# Patient Record
Sex: Female | Born: 1955 | Race: White | Hispanic: No | Marital: Married
Health system: Southern US, Community
[De-identification: ages and names within clinical notes are randomized; demographics above are authoritative.]

## PROBLEM LIST (undated history)

## (undated) DIAGNOSIS — E119 Type 2 diabetes mellitus without complications: Secondary | ICD-10-CM

## (undated) DIAGNOSIS — I890 Lymphedema, not elsewhere classified: Secondary | ICD-10-CM

## (undated) DIAGNOSIS — N183 Chronic kidney disease, stage 3 unspecified: Secondary | ICD-10-CM

## (undated) DIAGNOSIS — I10 Essential (primary) hypertension: Secondary | ICD-10-CM

## (undated) DIAGNOSIS — I509 Heart failure, unspecified: Secondary | ICD-10-CM

---

## 2012-04-07 DIAGNOSIS — E1139 Type 2 diabetes mellitus with other diabetic ophthalmic complication: Secondary | ICD-10-CM | POA: Insufficient documentation

## 2012-07-28 DIAGNOSIS — E11311 Type 2 diabetes mellitus with unspecified diabetic retinopathy with macular edema: Secondary | ICD-10-CM | POA: Insufficient documentation

## 2013-03-27 DIAGNOSIS — I10 Essential (primary) hypertension: Secondary | ICD-10-CM | POA: Insufficient documentation

## 2013-03-27 DIAGNOSIS — N63 Unspecified lump in unspecified breast: Secondary | ICD-10-CM | POA: Insufficient documentation

## 2013-03-27 DIAGNOSIS — C4490 Unspecified malignant neoplasm of skin, unspecified: Secondary | ICD-10-CM | POA: Insufficient documentation

## 2013-03-27 DIAGNOSIS — E785 Hyperlipidemia, unspecified: Secondary | ICD-10-CM | POA: Insufficient documentation

## 2013-03-28 DIAGNOSIS — I110 Hypertensive heart disease with heart failure: Secondary | ICD-10-CM | POA: Insufficient documentation

## 2013-04-15 DIAGNOSIS — I2510 Atherosclerotic heart disease of native coronary artery without angina pectoris: Secondary | ICD-10-CM | POA: Insufficient documentation

## 2013-04-16 DIAGNOSIS — M1990 Unspecified osteoarthritis, unspecified site: Secondary | ICD-10-CM | POA: Insufficient documentation

## 2014-01-27 DIAGNOSIS — D649 Anemia, unspecified: Secondary | ICD-10-CM | POA: Insufficient documentation

## 2014-07-13 DIAGNOSIS — R600 Localized edema: Secondary | ICD-10-CM | POA: Insufficient documentation

## 2016-12-13 DIAGNOSIS — L03031 Cellulitis of right toe: Secondary | ICD-10-CM | POA: Insufficient documentation

## 2016-12-13 DIAGNOSIS — B351 Tinea unguium: Secondary | ICD-10-CM | POA: Insufficient documentation

## 2017-09-17 DIAGNOSIS — L601 Onycholysis: Secondary | ICD-10-CM | POA: Insufficient documentation

## 2017-09-17 DIAGNOSIS — E1140 Type 2 diabetes mellitus with diabetic neuropathy, unspecified: Secondary | ICD-10-CM | POA: Insufficient documentation

## 2017-09-17 DIAGNOSIS — S91209A Unspecified open wound of unspecified toe(s) with damage to nail, initial encounter: Secondary | ICD-10-CM | POA: Insufficient documentation

## 2017-10-10 DIAGNOSIS — R809 Proteinuria, unspecified: Secondary | ICD-10-CM | POA: Insufficient documentation

## 2017-10-23 DIAGNOSIS — I288 Other diseases of pulmonary vessels: Secondary | ICD-10-CM | POA: Insufficient documentation

## 2017-10-29 DIAGNOSIS — R079 Chest pain, unspecified: Secondary | ICD-10-CM | POA: Insufficient documentation

## 2017-10-29 DIAGNOSIS — R9431 Abnormal electrocardiogram [ECG] [EKG]: Secondary | ICD-10-CM | POA: Insufficient documentation

## 2017-10-29 DIAGNOSIS — R0609 Other forms of dyspnea: Secondary | ICD-10-CM | POA: Insufficient documentation

## 2017-12-05 DIAGNOSIS — S90829A Blister (nonthermal), unspecified foot, initial encounter: Secondary | ICD-10-CM | POA: Insufficient documentation

## 2019-01-21 DIAGNOSIS — E875 Hyperkalemia: Secondary | ICD-10-CM | POA: Insufficient documentation

## 2019-01-21 DIAGNOSIS — N179 Acute kidney failure, unspecified: Secondary | ICD-10-CM | POA: Insufficient documentation

## 2019-01-21 DIAGNOSIS — L97922 Non-pressure chronic ulcer of unspecified part of left lower leg with fat layer exposed: Secondary | ICD-10-CM | POA: Insufficient documentation

## 2019-09-08 DIAGNOSIS — L03032 Cellulitis of left toe: Secondary | ICD-10-CM | POA: Insufficient documentation

## 2020-10-22 ENCOUNTER — Emergency Department
Admission: EM | Admit: 2020-10-22 | Discharge: 2020-10-23 | Disposition: A | Discharge status: Home / Self Care | Payer: Medicare Other | Attending: Emergency Medicine | Admitting: Emergency Medicine

## 2020-10-22 ENCOUNTER — Other Ambulatory Visit: Payer: Self-pay

## 2020-10-22 DIAGNOSIS — E119 Type 2 diabetes mellitus without complications: Secondary | ICD-10-CM | POA: Insufficient documentation

## 2020-10-22 DIAGNOSIS — L03317 Cellulitis of buttock: Secondary | ICD-10-CM | POA: Diagnosis not present

## 2020-10-22 DIAGNOSIS — L89311 Pressure ulcer of right buttock, stage 1: Secondary | ICD-10-CM | POA: Insufficient documentation

## 2020-10-22 DIAGNOSIS — I110 Hypertensive heart disease with heart failure: Secondary | ICD-10-CM | POA: Diagnosis not present

## 2020-10-22 DIAGNOSIS — I509 Heart failure, unspecified: Secondary | ICD-10-CM | POA: Insufficient documentation

## 2020-10-22 DIAGNOSIS — L89301 Pressure ulcer of unspecified buttock, stage 1: Secondary | ICD-10-CM

## 2020-10-22 HISTORY — DX: Chronic kidney disease, stage 3 unspecified: N18.30

## 2020-10-22 HISTORY — DX: Essential (primary) hypertension: I10

## 2020-10-22 HISTORY — DX: Heart failure, unspecified: I50.9

## 2020-10-22 HISTORY — DX: Type 2 diabetes mellitus without complications: E11.9

## 2020-10-22 HISTORY — DX: Lymphedema, not elsewhere classified: I89.0

## 2020-10-22 LAB — CBC WITH DIFFERENTIAL/PLATELET
Abs Immature Granulocytes: 0.05 10*3/uL (ref 0.00–0.07)
Basophils Absolute: 0 10*3/uL (ref 0.0–0.1)
Basophils Relative: 0 %
Eosinophils Absolute: 0.3 10*3/uL (ref 0.0–0.5)
Eosinophils Relative: 5 %
HCT: 32 % — ABNORMAL LOW (ref 36.0–46.0)
Hemoglobin: 10.3 g/dL — ABNORMAL LOW (ref 12.0–15.0)
Immature Granulocytes: 1 %
Lymphocytes Relative: 11 %
Lymphs Abs: 0.7 10*3/uL (ref 0.7–4.0)
MCH: 31.4 pg (ref 26.0–34.0)
MCHC: 32.2 g/dL (ref 30.0–36.0)
MCV: 97.6 fL (ref 80.0–100.0)
Monocytes Absolute: 0.5 10*3/uL (ref 0.1–1.0)
Monocytes Relative: 8 %
Neutro Abs: 4.5 10*3/uL (ref 1.7–7.7)
Neutrophils Relative %: 75 %
Platelets: 285 10*3/uL (ref 150–400)
RBC: 3.28 MIL/uL — ABNORMAL LOW (ref 3.87–5.11)
RDW: 13.8 % (ref 11.5–15.5)
WBC: 6 10*3/uL (ref 4.0–10.5)
nRBC: 0 % (ref 0.0–0.2)

## 2020-10-22 LAB — COMPREHENSIVE METABOLIC PANEL
ALT: 15 U/L (ref 0–44)
AST: 19 U/L (ref 15–41)
Albumin: 3.3 g/dL — ABNORMAL LOW (ref 3.5–5.0)
Alkaline Phosphatase: 101 U/L (ref 38–126)
Anion gap: 7 (ref 5–15)
BUN: 22 mg/dL (ref 8–23)
CO2: 28 mmol/L (ref 22–32)
Calcium: 8.9 mg/dL (ref 8.9–10.3)
Chloride: 106 mmol/L (ref 98–111)
Creatinine, Ser: 1.27 mg/dL — ABNORMAL HIGH (ref 0.44–1.00)
GFR, Estimated: 48 mL/min — ABNORMAL LOW (ref >60)
Glucose, Bld: 127 mg/dL — ABNORMAL HIGH (ref 70–99)
Potassium: 4.4 mmol/L (ref 3.5–5.1)
Sodium: 141 mmol/L (ref 135–145)
Total Bilirubin: 0.8 mg/dL (ref 0.3–1.2)
Total Protein: 7.3 g/dL (ref 6.5–8.1)

## 2020-10-22 MED ORDER — CEPHALEXIN 500 MG PO CAPS: 500.0000 mg | ORAL_CAPSULE | Freq: Four times a day (QID) | ORAL | 0 refills | Status: AC

## 2020-10-22 MED ORDER — CLINDAMYCIN PHOSPHATE 900 MG/50ML IV SOLN
900.0000 mg | Freq: Once | INTRAVENOUS | Status: AC
  Administered 2020-10-22: 900 mg via INTRAVENOUS
  Filled 2020-10-22: qty 50

## 2020-10-22 NOTE — ED Notes (Signed)
Patient stable and discharged with all personal belongings and AVS. AVS and discharge instructions reviewed with patient and opportunity for questions provided.   

## 2020-10-22 NOTE — ED Triage Notes (Addendum)
Per EMS, pt was sent from home by home health for swelling and cellulitis of legs bilaterally. Pt denies CP, SOB, Dizziness. Per pt leg is usually swollen. 2+ edema noted in legs bilaterally. No redness or heat noted, no drainage noted. Ulcers noted on heels bilaterally. Pt is AOX4, NAD noted at this time.

## 2020-10-22 NOTE — ED Provider Notes (Signed)
[facility] Emergency Department Provider Note  ____________________________________________   Event Date/Time   First MD Initiated Contact with Patient 10/22/20 1724     (approximate)  I have reviewed the triage vital signs and the nursing notes.   HISTORY  Chief Complaint Cellulitis    HPI Laura Mccarty is a 65 y.o. female presents to the emergency department with concerns of wound infections.  Patient has history of lymphedema along with obesity.  Husband takes care of her wounds regularly and they do have a home health nurse to come and evaluate the areas.  Home health nurse instructed him to come to the emergency department today.  Patient states she also has a bedsore that is starting on the left buttock.  She denies any fever or chills.  No chest pain or shortness of breath.  Past Medical History:  Diagnosis Date   CHF (congestive heart failure) (HCC)    Diabetes mellitus without complication (HCC)    Hypertension    Kidney disease, chronic, stage III (GFR 30-59 ml/min) (HCC)    Lymphedema     There are no problems to display for this patient.   History reviewed. No pertinent surgical history.  Prior to Admission medications   Medication Sig Start Date End Date Taking? Authorizing Provider  cephALEXin (KEFLEX) 500 MG capsule Take 1 capsule (500 mg total) by mouth 4 (four) times daily for 10 days. 10/22/20 11/01/20 Yes Conner Muegge, Linden Dolin, PA-C    Allergies Codeine  History reviewed. No pertinent family history.  Social History Social History   Tobacco Use   Smoking status: Never   Smokeless tobacco: Never    Review of Systems  Constitutional: No fever/chills Eyes: No visual changes. ENT: No sore throat. Respiratory: Denies cough Cardiovascular: Denies chest pain Gastrointestinal: Denies abdominal pain Genitourinary: Negative for dysuria. Musculoskeletal: Negative for back pain. Skin: Negative for rash. Psychiatric: no mood  changes,     ____________________________________________   PHYSICAL EXAM:  VITAL SIGNS: ED Triage Vitals  Enc Vitals Group     BP 10/22/20 1714 (!) 184/69     Pulse Rate 10/22/20 1714 65     Resp 10/22/20 1714 18     Temp 10/22/20 1714 98.2 F (36.8 C)     Temp Source 10/22/20 1714 Oral     SpO2 10/22/20 1714 97 %     Weight 10/22/20 1715 (!) 364 lb 6.7 oz (165.3 kg)     Height 10/22/20 1715 '5\' 5"'$  (1.651 m)     Head Circumference --      Peak Flow --      Pain Score 10/22/20 1715 0     Pain Loc --      Pain Edu? --      Excl. in [place]? --     Constitutional: Alert and oriented. Well appearing and in no acute distress. Eyes: Conjunctivae are normal.  Head: Atraumatic. Nose: No congestion/rhinnorhea. Mouth/Throat: Mucous membranes are moist.   Neck:  supple no lymphadenopathy noted Cardiovascular: Normal rate, regular rhythm. Heart sounds are normal Respiratory: Normal respiratory effort.  No retractions, lungs c t a  GU: deferred Musculoskeletal: Lymphedema noted in the lower extremities bilaterally, multiple scabs noted along the feet and ankles, these appear to be old and healing, bedsore noted on the left buttock and follow-up of cellulitis surrounding this area  neurologic:  Normal speech and language.  Skin:  Skin is warm, dry, see above the musculoskeletal psychiatric: Mood and affect are normal. Speech and  behavior are normal.  ____________________________________________   LABS (all labs ordered are listed, but only abnormal results are displayed)  Labs Reviewed  COMPREHENSIVE METABOLIC PANEL - Abnormal; Notable for the following components:      Result Value   Glucose, Bld 127 (*)    Creatinine, Ser 1.27 (*)    Albumin 3.3 (*)    GFR, Estimated 48 (*)    All other components within normal limits  CBC WITH DIFFERENTIAL/PLATELET - Abnormal; Notable for the following components:   RBC 3.28 (*)    Hemoglobin 10.3 (*)    HCT 32.0 (*)    All other components  within normal limits   ____________________________________________   ____________________________________________  RADIOLOGY    ____________________________________________   PROCEDURES  Procedure(s) performed: No  Procedures    ____________________________________________   INITIAL IMPRESSION / ASSESSMENT AND PLAN / ED COURSE  Pertinent labs & imaging results that were available during my care of the patient were reviewed by me and considered in my medical decision making (see chart for details).   Patient 65 year old female presents emergency department with concerns of cellulitis.  See HPI.  Physical exam shows patient appears stable  DDx: Cellulitis, chronic sore secondary lymphedema, bedsore, CHF  CBC and metabolic panel are normal  On exam patient's wounds appear to be old on her feet.  Noted concern for admission.  Patient does have a large bedsore on the left buttock along with a large amount of redness surrounding the area for cellulitis.  Feel that the patient will do better as an outpatient.  Will give her IV antibiotics here.  With her labs being normal she is not septic.  She was given clinic prescription for Keflex as outpatient.  She can have her home health nurse evaluate the area when she comes to their house twice a week.  Return to emergency department worsening.  Information was also given to them about [facility] for transportation needs.     Laura Mccarty was evaluated in Emergency Department on 10/22/2020 for the symptoms described in the history of present illness. She was evaluated in the context of the global COVID-19 pandemic, which necessitated consideration that the patient might be at risk for infection with the SARS-CoV-2 virus that causes COVID-19. Institutional protocols and algorithms that pertain to the evaluation of patients at risk for COVID-19 are in a state of rapid change based on information released by regulatory bodies including the CDC and  federal and state organizations. These policies and algorithms were followed during the patient's care in the ED.    As part of my medical decision making, I reviewed the following data within the [facility] History obtained from family, Nursing notes reviewed and incorporated, Labs reviewed , Old chart reviewed, Notes from prior ED visits, and [place] Controlled Substance Database  ____________________________________________   FINAL CLINICAL IMPRESSION(S) / ED DIAGNOSES  Final diagnoses:  Pressure injury of buttock, stage 1, unspecified laterality      NEW MEDICATIONS STARTED DURING THIS VISIT:  New Prescriptions   CEPHALEXIN (KEFLEX) 500 MG CAPSULE    Take 1 capsule (500 mg total) by mouth 4 (four) times daily for 10 days.     Note:  This document was prepared using Dragon voice recognition software and may include unintentional dictation errors.    Versie Starks, PA-C 10/22/20 1844    Naaman Plummer, MD 10/25/20 [phone]

## 2020-10-22 NOTE — Discharge Instructions (Addendum)
[facility], ask them about transportation and if they service your area Business phone: [phone] Fax: [phone] Address: [address], [place], [place]

## 2020-10-22 NOTE — ED Notes (Signed)
Pt reports swelling in legs bilaterally has been present for over a year, swelling and cellulitis worsened over the last month.

## 2020-12-21 ENCOUNTER — Other Ambulatory Visit: Payer: Self-pay

## 2020-12-21 ENCOUNTER — Encounter: Payer: Self-pay | Admitting: Podiatry

## 2020-12-21 ENCOUNTER — Ambulatory Visit: Payer: Medicare Other | Admitting: Podiatry

## 2020-12-21 ENCOUNTER — Encounter (INDEPENDENT_AMBULATORY_CARE_PROVIDER_SITE_OTHER): Payer: Self-pay

## 2020-12-21 DIAGNOSIS — E08621 Diabetes mellitus due to underlying condition with foot ulcer: Secondary | ICD-10-CM

## 2020-12-21 DIAGNOSIS — L97512 Non-pressure chronic ulcer of other part of right foot with fat layer exposed: Secondary | ICD-10-CM | POA: Diagnosis not present

## 2020-12-21 DIAGNOSIS — I890 Lymphedema, not elsewhere classified: Secondary | ICD-10-CM | POA: Diagnosis not present

## 2020-12-25 NOTE — Progress Notes (Signed)
  Subjective:  Patient ID: Laura Mccarty, female    DOB: 31-Jul-1955,  MRN: [id number]  Chief Complaint  Patient presents with   Callouses    NP   callous over a wound right foot  and callous on left foot     65 y.o. female presents with the above complaint. History confirmed with patient. Here with her husband.  She has significant issues with lymphedema that is chronic.  [facility] home nursing has been coming to apply compression dressings but this made drainage from the right foot ulcer much worse and the wound began to worsen.  Objective:  Physical Exam: warm, good capillary refill.  Unable to palpate pulses due to significant lymphedema throughout both lower extremities which is severe.  Pitting.  Weeping skin on legs.  Plantar right foot ulcer measuring 0.6 x 0.6 x 0.4 cm full-thickness with exposed subcutaneous tissue, no exposed bone tendon or joint.  No cellulitis malodor or purulence.  Left foot preulcerative callus about 5 in the same location      Assessment:   1. Diabetic ulcer of other part of right foot associated with diabetes mellitus due to underlying condition, with fat layer exposed ([place])   2. Lymphedema of both lower extremities      Plan:  Patient was evaluated and treated and all questions answered.  Ulcer right foot -We discussed the etiology and factors that are a part of the wound healing process.  We also discussed the risk of infection both soft tissue and osteomyelitis from open ulceration.  Discussed the risk of limb loss if this happens or worsens. -Her A1c is 6.7% -Debridement as below. -Dressed with Iodosorb, DSD. -Order written for [facility] home nursing twice weekly visits with Iodosorb, Hydrofera Blue, compression bilateral  Procedure: Excisional Debridement of Wound Rationale: Removal of non-viable soft tissue from the wound to promote healing.  Anesthesia: none Post-Debridement Wound Measurements: 0.6 cm x 0.6 cm x 0.4 cm  Type of Debridement:  Sharp Excisional Tissue Removed: Non-viable soft tissue Depth of Debridement: subcutaneous tissue. Technique: Sharp excisional debridement to bleeding, viable wound base.  Dressing: Dry, sterile, compression dressing. Disposition: Patient tolerated procedure well. Patient to return in 1 week for follow-up.  No follow-ups on file.       No follow-ups on file.

## 2020-12-26 ENCOUNTER — Telehealth: Payer: Self-pay | Admitting: *Deleted

## 2020-12-26 NOTE — Telephone Encounter (Signed)
"  Dr. Sherryle Lis said he was going to refer me to [facility] and they said they have not received anything yet."  I see the order, do you know the fax number?  "Yes, the fax number is [phone]."

## 2021-01-07 ENCOUNTER — Other Ambulatory Visit: Payer: Self-pay

## 2021-01-07 ENCOUNTER — Encounter: Payer: Self-pay | Admitting: Emergency Medicine

## 2021-01-07 ENCOUNTER — Emergency Department: Payer: Medicare Other

## 2021-01-07 DIAGNOSIS — D631 Anemia in chronic kidney disease: Secondary | ICD-10-CM | POA: Diagnosis present

## 2021-01-07 DIAGNOSIS — E1122 Type 2 diabetes mellitus with diabetic chronic kidney disease: Secondary | ICD-10-CM | POA: Diagnosis present

## 2021-01-07 DIAGNOSIS — L97412 Non-pressure chronic ulcer of right heel and midfoot with fat layer exposed: Secondary | ICD-10-CM | POA: Diagnosis present

## 2021-01-07 DIAGNOSIS — E1140 Type 2 diabetes mellitus with diabetic neuropathy, unspecified: Secondary | ICD-10-CM | POA: Diagnosis present

## 2021-01-07 DIAGNOSIS — Z9861 Coronary angioplasty status: Secondary | ICD-10-CM

## 2021-01-07 DIAGNOSIS — Z7982 Long term (current) use of aspirin: Secondary | ICD-10-CM

## 2021-01-07 DIAGNOSIS — Z79899 Other long term (current) drug therapy: Secondary | ICD-10-CM

## 2021-01-07 DIAGNOSIS — I890 Lymphedema, not elsewhere classified: Secondary | ICD-10-CM | POA: Diagnosis present

## 2021-01-07 DIAGNOSIS — N1831 Chronic kidney disease, stage 3a: Secondary | ICD-10-CM | POA: Diagnosis present

## 2021-01-07 DIAGNOSIS — I2510 Atherosclerotic heart disease of native coronary artery without angina pectoris: Secondary | ICD-10-CM | POA: Diagnosis present

## 2021-01-07 DIAGNOSIS — I255 Ischemic cardiomyopathy: Secondary | ICD-10-CM | POA: Diagnosis present

## 2021-01-07 DIAGNOSIS — E11628 Type 2 diabetes mellitus with other skin complications: Secondary | ICD-10-CM | POA: Diagnosis present

## 2021-01-07 DIAGNOSIS — Z23 Encounter for immunization: Secondary | ICD-10-CM

## 2021-01-07 DIAGNOSIS — E11621 Type 2 diabetes mellitus with foot ulcer: Principal | ICD-10-CM | POA: Diagnosis present

## 2021-01-07 DIAGNOSIS — Z6841 Body Mass Index (BMI) 40.0 and over, adult: Secondary | ICD-10-CM

## 2021-01-07 DIAGNOSIS — I440 Atrioventricular block, first degree: Secondary | ICD-10-CM | POA: Diagnosis present

## 2021-01-07 DIAGNOSIS — I5032 Chronic diastolic (congestive) heart failure: Secondary | ICD-10-CM | POA: Diagnosis present

## 2021-01-07 DIAGNOSIS — I130 Hypertensive heart and chronic kidney disease with heart failure and stage 1 through stage 4 chronic kidney disease, or unspecified chronic kidney disease: Secondary | ICD-10-CM | POA: Diagnosis present

## 2021-01-07 DIAGNOSIS — L03115 Cellulitis of right lower limb: Secondary | ICD-10-CM | POA: Diagnosis present

## 2021-01-07 DIAGNOSIS — Z20822 Contact with and (suspected) exposure to covid-19: Secondary | ICD-10-CM | POA: Diagnosis present

## 2021-01-07 LAB — CBC WITH DIFFERENTIAL/PLATELET
Abs Immature Granulocytes: 0.03 10*3/uL (ref 0.00–0.07)
Basophils Absolute: 0 10*3/uL (ref 0.0–0.1)
Basophils Relative: 1 %
Eosinophils Absolute: 0.2 10*3/uL (ref 0.0–0.5)
Eosinophils Relative: 3 %
HCT: 31.7 % — ABNORMAL LOW (ref 36.0–46.0)
Hemoglobin: 9.8 g/dL — ABNORMAL LOW (ref 12.0–15.0)
Immature Granulocytes: 1 %
Lymphocytes Relative: 10 %
Lymphs Abs: 0.6 10*3/uL — ABNORMAL LOW (ref 0.7–4.0)
MCH: 31.5 pg (ref 26.0–34.0)
MCHC: 30.9 g/dL (ref 30.0–36.0)
MCV: 101.9 fL — ABNORMAL HIGH (ref 80.0–100.0)
Monocytes Absolute: 0.4 10*3/uL (ref 0.1–1.0)
Monocytes Relative: 7 %
Neutro Abs: 5.1 10*3/uL (ref 1.7–7.7)
Neutrophils Relative %: 78 %
Platelets: 288 10*3/uL (ref 150–400)
RBC: 3.11 MIL/uL — ABNORMAL LOW (ref 3.87–5.11)
RDW: 16.8 % — ABNORMAL HIGH (ref 11.5–15.5)
WBC: 6.3 10*3/uL (ref 4.0–10.5)
nRBC: 0 % (ref 0.0–0.2)

## 2021-01-07 LAB — COMPREHENSIVE METABOLIC PANEL
ALT: 16 U/L (ref 0–44)
AST: 20 U/L (ref 15–41)
Albumin: 3.4 g/dL — ABNORMAL LOW (ref 3.5–5.0)
Alkaline Phosphatase: 126 U/L (ref 38–126)
Anion gap: 5 (ref 5–15)
BUN: 21 mg/dL (ref 8–23)
CO2: 28 mmol/L (ref 22–32)
Calcium: 8.7 mg/dL — ABNORMAL LOW (ref 8.9–10.3)
Chloride: 106 mmol/L (ref 98–111)
Creatinine, Ser: 1.1 mg/dL — ABNORMAL HIGH (ref 0.44–1.00)
GFR, Estimated: 56 mL/min — ABNORMAL LOW (ref >60)
Glucose, Bld: 160 mg/dL — ABNORMAL HIGH (ref 70–99)
Potassium: 4.4 mmol/L (ref 3.5–5.1)
Sodium: 139 mmol/L (ref 135–145)
Total Bilirubin: 0.9 mg/dL (ref 0.3–1.2)
Total Protein: 8.2 g/dL — ABNORMAL HIGH (ref 6.5–8.1)

## 2021-01-07 LAB — LACTIC ACID, PLASMA: Lactic Acid, Venous: 1.1 mmol/L (ref 0.5–1.9)

## 2021-01-07 LAB — BRAIN NATRIURETIC PEPTIDE: B Natriuretic Peptide: 258.3 pg/mL — ABNORMAL HIGH (ref 0.0–100.0)

## 2021-01-07 NOTE — ED Triage Notes (Signed)
Arrived by EMS from home for wound infection right leg. Drainage present. Patient is followed by home health who advised her to come be seen. EMS vitals 200/80 blood pressure

## 2021-01-07 NOTE — ED Notes (Signed)
Pt assisted to bathroom, husband with patient, pt sitting in recliner. Urine sent to lab with save label.

## 2021-01-07 NOTE — ED Triage Notes (Signed)
Pt states hx of lymphedema, states had calluses cut off her R foot over 3 weeks ago, states wound to R foot at this time. Pt's husband states initially was wrapped with una boot but pt started complaining of pain and una boot and coban was cut off her husband.   Pt also with wet wound sounding x 1 month in triage, hx of CHF.

## 2021-01-08 ENCOUNTER — Other Ambulatory Visit: Payer: Self-pay

## 2021-01-08 ENCOUNTER — Inpatient Hospital Stay
Admission: EM | Admit: 2021-01-08 | Discharge: 2021-01-10 | DRG: 623 | Disposition: A | Discharge status: Home Health | Payer: Medicare Other | Attending: Internal Medicine | Admitting: Internal Medicine

## 2021-01-08 ENCOUNTER — Emergency Department: Payer: Medicare Other

## 2021-01-08 DIAGNOSIS — Z20822 Contact with and (suspected) exposure to covid-19: Secondary | ICD-10-CM | POA: Diagnosis present

## 2021-01-08 DIAGNOSIS — Z79899 Other long term (current) drug therapy: Secondary | ICD-10-CM | POA: Diagnosis not present

## 2021-01-08 DIAGNOSIS — L03115 Cellulitis of right lower limb: Secondary | ICD-10-CM | POA: Diagnosis present

## 2021-01-08 DIAGNOSIS — E1140 Type 2 diabetes mellitus with diabetic neuropathy, unspecified: Secondary | ICD-10-CM | POA: Diagnosis present

## 2021-01-08 DIAGNOSIS — E11621 Type 2 diabetes mellitus with foot ulcer: Secondary | ICD-10-CM

## 2021-01-08 DIAGNOSIS — I2510 Atherosclerotic heart disease of native coronary artery without angina pectoris: Secondary | ICD-10-CM | POA: Diagnosis present

## 2021-01-08 DIAGNOSIS — E11628 Type 2 diabetes mellitus with other skin complications: Secondary | ICD-10-CM | POA: Diagnosis present

## 2021-01-08 DIAGNOSIS — L089 Local infection of the skin and subcutaneous tissue, unspecified: Secondary | ICD-10-CM

## 2021-01-08 DIAGNOSIS — I890 Lymphedema, not elsewhere classified: Secondary | ICD-10-CM

## 2021-01-08 DIAGNOSIS — I5032 Chronic diastolic (congestive) heart failure: Secondary | ICD-10-CM | POA: Diagnosis present

## 2021-01-08 DIAGNOSIS — I130 Hypertensive heart and chronic kidney disease with heart failure and stage 1 through stage 4 chronic kidney disease, or unspecified chronic kidney disease: Secondary | ICD-10-CM | POA: Diagnosis present

## 2021-01-08 DIAGNOSIS — Z7982 Long term (current) use of aspirin: Secondary | ICD-10-CM | POA: Diagnosis not present

## 2021-01-08 DIAGNOSIS — Z23 Encounter for immunization: Secondary | ICD-10-CM | POA: Diagnosis present

## 2021-01-08 DIAGNOSIS — N1831 Chronic kidney disease, stage 3a: Secondary | ICD-10-CM

## 2021-01-08 DIAGNOSIS — I255 Ischemic cardiomyopathy: Secondary | ICD-10-CM | POA: Diagnosis present

## 2021-01-08 DIAGNOSIS — L97412 Non-pressure chronic ulcer of right heel and midfoot with fat layer exposed: Secondary | ICD-10-CM | POA: Diagnosis present

## 2021-01-08 DIAGNOSIS — Z9861 Coronary angioplasty status: Secondary | ICD-10-CM | POA: Diagnosis not present

## 2021-01-08 DIAGNOSIS — Z6841 Body Mass Index (BMI) 40.0 and over, adult: Secondary | ICD-10-CM | POA: Diagnosis not present

## 2021-01-08 DIAGNOSIS — I440 Atrioventricular block, first degree: Secondary | ICD-10-CM | POA: Diagnosis present

## 2021-01-08 DIAGNOSIS — D631 Anemia in chronic kidney disease: Secondary | ICD-10-CM | POA: Diagnosis present

## 2021-01-08 DIAGNOSIS — E1122 Type 2 diabetes mellitus with diabetic chronic kidney disease: Secondary | ICD-10-CM | POA: Diagnosis present

## 2021-01-08 LAB — CBG MONITORING, ED
Glucose-Capillary: 169 mg/dL — ABNORMAL HIGH (ref 70–99)
Glucose-Capillary: 172 mg/dL — ABNORMAL HIGH (ref 70–99)
Glucose-Capillary: 166 mg/dL — ABNORMAL HIGH (ref 70–99)

## 2021-01-08 LAB — CBC
HCT: 28.9 % — ABNORMAL LOW (ref 36.0–46.0)
Hemoglobin: 8.8 g/dL — ABNORMAL LOW (ref 12.0–15.0)
MCH: 31.1 pg (ref 26.0–34.0)
MCHC: 30.4 g/dL (ref 30.0–36.0)
MCV: 102.1 fL — ABNORMAL HIGH (ref 80.0–100.0)
Platelets: 249 10*3/uL (ref 150–400)
RBC: 2.83 MIL/uL — ABNORMAL LOW (ref 3.87–5.11)
RDW: 16.9 % — ABNORMAL HIGH (ref 11.5–15.5)
WBC: 5.2 10*3/uL (ref 4.0–10.5)
nRBC: 0 % (ref 0.0–0.2)

## 2021-01-08 LAB — CREATININE, SERUM
Creatinine, Ser: 1.3 mg/dL — ABNORMAL HIGH (ref 0.44–1.00)
GFR, Estimated: 46 mL/min — ABNORMAL LOW (ref >60)

## 2021-01-08 LAB — RESP PANEL BY RT-PCR (FLU A&B, COVID) ARPGX2
Influenza A by PCR: NEGATIVE
Influenza B by PCR: NEGATIVE
SARS Coronavirus 2 by RT PCR: NEGATIVE

## 2021-01-08 MED ORDER — ACETAMINOPHEN 650 MG RE SUPP
650.0000 mg | Freq: Four times a day (QID) | RECTAL | Status: DC | PRN
  Filled 2021-01-08: qty 1

## 2021-01-08 MED ORDER — VANCOMYCIN HCL 1750 MG/350ML IV SOLN
1750.0000 mg | INTRAVENOUS | Status: DC
  Administered 2021-01-09: 08:00:00 1750 mg via INTRAVENOUS
  Filled 2021-01-08: qty 350

## 2021-01-08 MED ORDER — ACETAMINOPHEN 325 MG PO TABS: 650.0000 mg | ORAL_TABLET | Freq: Four times a day (QID) | ORAL | Status: DC | PRN

## 2021-01-08 MED ORDER — INSULIN ASPART 100 UNIT/ML IJ SOLN
0.0000 [IU] | Freq: Three times a day (TID) | INTRAMUSCULAR | Status: DC
  Administered 2021-01-08 – 2021-01-09 (×2): 4 [IU] via SUBCUTANEOUS
  Administered 2021-01-09 – 2021-01-10 (×4): 3 [IU] via SUBCUTANEOUS
  Filled 2021-01-08 (×7): qty 1

## 2021-01-08 MED ORDER — VANCOMYCIN HCL 500 MG/100ML IV SOLN
500.0000 mg | Freq: Once | INTRAVENOUS | Status: AC
  Administered 2021-01-08: 08:00:00 500 mg via INTRAVENOUS
  Filled 2021-01-08: qty 100

## 2021-01-08 MED ORDER — VANCOMYCIN HCL 2000 MG/400ML IV SOLN
2000.0000 mg | Freq: Once | INTRAVENOUS | Status: AC
  Administered 2021-01-08: 04:00:00 2000 mg via INTRAVENOUS
  Filled 2021-01-08: qty 400

## 2021-01-08 MED ORDER — ONDANSETRON HCL 4 MG PO TABS: 4.0000 mg | ORAL_TABLET | Freq: Four times a day (QID) | ORAL | Status: DC | PRN

## 2021-01-08 MED ORDER — ONDANSETRON HCL 4 MG/2ML IJ SOLN
4.0000 mg | Freq: Four times a day (QID) | INTRAMUSCULAR | Status: DC | PRN
  Administered 2021-01-08: 07:00:00 4 mg via INTRAVENOUS
  Filled 2021-01-08: qty 2

## 2021-01-08 MED ORDER — GADOBUTROL 1 MMOL/ML IV SOLN
10.0000 mL | Freq: Once | INTRAVENOUS | Status: AC | PRN
  Administered 2021-01-08: 06:00:00 10 mL via INTRAVENOUS

## 2021-01-08 MED ORDER — INSULIN ASPART 100 UNIT/ML IJ SOLN: 0.0000 [IU] | Freq: Every day | INTRAMUSCULAR | Status: DC

## 2021-01-08 MED ORDER — VANCOMYCIN HCL IN DEXTROSE 1-5 GM/200ML-% IV SOLN: 1000.0000 mg | Freq: Once | INTRAVENOUS | Status: DC

## 2021-01-08 MED ORDER — ENOXAPARIN SODIUM 100 MG/ML IJ SOSY
0.5000 mg/kg | PREFILLED_SYRINGE | INTRAMUSCULAR | Status: DC
  Administered 2021-01-08 – 2021-01-10 (×3): 85 mg via SUBCUTANEOUS
  Filled 2021-01-08 (×3): qty 0.85

## 2021-01-08 MED ORDER — PIPERACILLIN-TAZOBACTAM 3.375 G IVPB
3.3750 g | Freq: Three times a day (TID) | INTRAVENOUS | Status: DC
  Administered 2021-01-08 – 2021-01-10 (×7): 3.375 g via INTRAVENOUS
  Filled 2021-01-08 (×7): qty 50

## 2021-01-08 MED ORDER — PIPERACILLIN-TAZOBACTAM 3.375 G IVPB 30 MIN
3.3750 g | Freq: Once | INTRAVENOUS | Status: AC
  Administered 2021-01-08: 04:00:00 3.375 g via INTRAVENOUS
  Filled 2021-01-08: qty 50

## 2021-01-08 NOTE — H&P (Deleted)
Patient seen and examined personally, I reviewed the chart, history and physical and admission note, done by admitting physician this morning and agree with the same with following addendum.  Please refer to the morning admission note for more detailed plan of care.  Briefly,   65 year old female with diabetes, HTN, CAD with PCI, ischemic cardiomyopathy, chronic diastolic CHF CKD stage III, lymphedema followed by podiatry presents to the ED with a right foot infection after removal of her Unna boot last seen by her podiatrist 3 weeks PTA when she had some calluses that shaved off but noticed she was having increasing foot pain. In the ED hypertensive, normal lactic acid and WBC count EKG sinus rhythm, chest x-ray no active disease right foot x-ray shows no evidence of acute osteomyelitis MRI ordered placed on antibiotics podiatry consulted and admitted  Seen in the ED hallway she is alert awake resting comfortably.  Has bilateral chronic lymphedema  LE see pic EXTR  Cellulitis of the right foot in the setting of diabetes and chronic lymphedema: Continue empiric antibiotics, follow-up MRI, and podiatry recommendation. MRI reviewed shows focal soft tissue ulcer on the plantar to the fifth MTP joint without focal fluid collection extensive subcutaneous edema and ill-defined fluid throughout the foot consistent with soft tissue infection and cellulitis no evidence of septic arthritis or osteomyelitis.   Other issues are chronic and stable see HPI  CAD PCI to LAD Essential hypertension Ischemic cardiomyopathy-BNP 258 slightly high T2DM Anemia of chronic disease hemoglobin 9.8 on admission. CKD stage III Morbid obesity with BMI 61

## 2021-01-08 NOTE — Progress Notes (Signed)
Pharmacy Antibiotic Note  Laura Mccarty is a 65 y.o. female admitted on 01/08/2021 with cellulitis.  Pharmacy has been consulted for Zosyn, Vancomycin dosing.  Plan: Zosyn 3.375 gm IV X 1 over 30 min given in ED on 11/27 @ 0348. Zosyn 3.375 gm IV Q8H EI ordered to start on 11/27 @ 1000.   Vancomycin 2 gm IV X 1 given in ED on 11/27 @ 0349. Additional vanc 500 mg IV X 1 ordered to make loading dose of 2500 mg.   Vancomycin 1750 mg IV Q24H ordered to start on 11/28 @ 0400.   AUC = 485.2 Vanc trough = 12.6   Height: 5\' 5"  (165.1 cm) Weight: (!) 167.8 kg (370 lb) IBW/kg (Calculated) : 57  Temp (24hrs), Avg:97.9 F (36.6 C), Min:97.9 F (36.6 C), Max:97.9 F (36.6 C)  Recent Labs  Lab 01/07/21 1916  WBC 6.3  CREATININE 1.10*  LATICACIDVEN 1.1    Estimated Creatinine Clearance: 82.6 mL/min (A) (by C-G formula based on SCr of 1.1 mg/dL (H)).    Allergies  Allergen Reactions   Codeine Nausea And Vomiting    Antimicrobials this admission:   >>    >>   Dose adjustments this admission:   Microbiology results:  BCx:   UCx:    Sputum:    MRSA PCR:   Thank you for allowing pharmacy to be a part of this patient's care.  Guinevere Stephenson D 01/08/2021 6:29 AM

## 2021-01-08 NOTE — ED Notes (Signed)
Pt denies nausea at this time.

## 2021-01-08 NOTE — Progress Notes (Signed)
Anticoagulation monitoring(Lovenox):  65 yo  female ordered Lovenox 40 mg Q24h    Filed Weights   01/07/21 1912  Weight: (!) 167.8 kg (370 lb)   BMI 61.6    Lab Results  Component Value Date   CREATININE 1.10 (H) 01/07/2021   CREATININE 1.27 (H) 10/22/2020   Estimated Creatinine Clearance: 82.6 mL/min (A) (by C-G formula based on SCr of 1.1 mg/dL (H)). Hemoglobin & Hematocrit     Component Value Date/Time   HGB 9.8 (L) 01/07/2021 1916   HCT 31.7 (L) 01/07/2021 1916     Per Protocol for Patient with estCrcl  > 30 ml/min and BMI > 30, will transition to Lovenox 85 mg Q24h.

## 2021-01-08 NOTE — ED Provider Notes (Signed)
[facility] Emergency Department Provider Note  ____________________________________________   Event Date/Time   First MD Initiated Contact with Patient 01/08/21 0259     (approximate)  I have reviewed the triage vital signs and the nursing notes.   HISTORY  Chief Complaint Foot Pain and Leg Swelling    HPI Laura Mccarty is a 65 y.o. female  with extensive medical history as listed below who presents for evaluation of right foot infection, as well as a cough x 1 month.  Cough has been present for about a month, not getting better or worse, moderate in severity.  Worse with exertion.  Of more concern is the patient's foot wound.  She has had chronic problems with the foot for years and has chronic lymphedema in her legs.  3 weeks ago she saw Dr. Sherryle Lis with podiatry and he shaved off some calluses.  The foot was wrapped and he told her to keep it in an [employer].  They did so but the pain was getting worse and worse.  Finally today they took the boot off and said that thick purulent and bloody material came from a wound that was not present before.  The patient has neuropathy so it is not painful unless she tries to walk on it.  However the amount of pus draining from a wound that appears new was of great concern.  A home health nurse sought and told her she should come to the emergency department.  The patient denies fever, sore throat, chest pain, shortness of breath except for when coughing, nausea, vomiting, and abdominal pain.  Nothing in particular makes the foot better or worse and it has become severe.        Past Medical History:  Diagnosis Date   CHF (congestive heart failure) (HCC)    Diabetes mellitus without complication ([place])    Hypertension    Kidney disease, chronic, stage III (GFR 30-59 ml/min) ([place])    Lymphedema     Patient Active Problem List   Diagnosis Date Noted   Paronychia of toe of left foot 09/08/2019   Acute kidney injury  superimposed on CKD ([place]) 01/21/2019   Hyperkalemia 01/21/2019   Leg ulcer, left, with fat layer exposed ([place]) 01/21/2019   Morbid obesity with BMI of 50.0-59.9, adult ([place]) 01/14/2018   Blister of foot 12/05/2017   Abnormal EKG 10/29/2017   Chest pain syndrome 10/29/2017   Dyspnea on exertion 10/29/2017   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   Microalbuminuria 10/10/2017   Nail avulsion of toe 09/17/2017   Onycholysis of toenail 09/17/2017   Type 2 diabetes mellitus with diabetic neuropathy ([place]) 09/17/2017   Onychomycosis 12/13/2016   Paronychia of toe of right foot 12/13/2016   Localized edema 07/13/2014   Anemia 01/27/2014   Osteoarthritis 04/16/2013   Coronary artery disease involving native coronary artery of native heart without angina pectoris 04/15/2013   Cardiomyopathy due to hypertension, with heart failure ([place]) 03/28/2013   Breast mass 03/27/2013   Dyslipidemia 03/27/2013   Essential hypertension 03/27/2013   Skin cancer 03/27/2013   Diabetic retinopathy with macular edema associated with type 2 diabetes mellitus ([place]) 07/28/2012   Type 2 diabetes mellitus with ophthalmic complication, without long-term current use of insulin ([place]) 04/07/2012    History reviewed. No pertinent surgical history.  Prior to Admission medications   Medication Sig Start Date End Date Taking? Authorizing Provider  clopidogrel (PLAVIX) 75 MG tablet Take 75 mg by mouth daily. 09/10/20  [provider]  ergocalciferol (VITAMIN D2) 1.25 MG (50000 UT) capsule Take 1 capsule by mouth every 7 (seven) days. 08/04/20   [provider]  gabapentin (NEURONTIN) 100 MG capsule TAKE 3 CAPSULES BY MOUTH AT NIGHT 06/21/20   [provider]  glipiZIDE (GLUCOTROL XL) 10 MG 24 hr tablet Take 10 mg by mouth daily. 09/10/20   [provider]  lisinopril (ZESTRIL) 20 MG tablet Take by mouth. 10/10/19   [provider]  nitroGLYCERIN (NITROSTAT) 0.4 MG SL tablet  Place under the tongue. 05/08/18   [provider]  torsemide (DEMADEX) 20 MG tablet Take 2 tablets by mouth daily. 11/16/19   [provider]  TRULICITY 4.19 FX/9.0WI SOPN SMARTSIG:0.5 Milliliter(s) SUB-Q Once a Week 11/20/20   [provider]    Allergies Codeine  History reviewed. No pertinent family history.  Social History Social History   Tobacco Use   Smoking status: Never   Smokeless tobacco: Never    Review of Systems Constitutional: No fever/chills Eyes: No visual changes. ENT: No sore throat. Cardiovascular: Denies chest pain. Respiratory: Cough x1 month.  Worse with exertion. Gastrointestinal: No abdominal pain.  No nausea, no vomiting.  No diarrhea.  No constipation. Genitourinary: Negative for dysuria. Musculoskeletal: Negative for neck pain.  Negative for back pain. Integumentary: Acute on chronic right foot wound with purulent discharge. Neurological: Negative for headaches, focal weakness or numbness.   ____________________________________________   PHYSICAL EXAM:  VITAL SIGNS: ED Triage Vitals  Enc Vitals Group     BP 01/07/21 1913 (!) 186/85     Pulse Rate 01/07/21 1913 67     Resp 01/07/21 1913 (!) 22     Temp 01/07/21 1913 97.9 F (36.6 C)     Temp Source 01/07/21 1913 Oral     SpO2 01/07/21 1913 95 %     Weight 01/07/21 1912 (!) 167.8 kg (370 lb)     Height 01/07/21 1912 1.651 m (5\' 5" )     Head Circumference --      Peak Flow --      Pain Score --      Pain Loc --      Pain Edu? --      Excl. in [place]? --     Constitutional: Alert and oriented.  Eyes: Conjunctivae are normal.  Head: Atraumatic. Nose: No congestion/rhinnorhea. Mouth/Throat: Patient is wearing a mask. Neck: No stridor.  No meningeal signs.   Cardiovascular: Normal rate, regular rhythm. Good peripheral circulation. Respiratory: Normal respiratory effort.  No retractions. Gastrointestinal: Morbid obesity.  Soft and nontender. No distention.   Musculoskeletal: Chronic lymphedema with skin thickening with heavily crusted and keratinous appearance.  The right foot appears red and is larger in size compared to the left.  It is difficult to appreciate through the thick skin deposits, but there does appear to be a wound on the right lateral midfoot that is not actively draining at this time but was the source of the bloody pus seen earlier and documented and a photo by the patient. Neurologic:  Normal speech and language. No gross focal neurologic deficits are appreciated.  Skin:  Skin is warm and dry.  See musculoskeletal exam above for additional details. Psychiatric: Mood and affect are normal. Speech and behavior are normal.  ____________________________________________   LABS (all labs ordered are listed, but only abnormal results are displayed)  Labs Reviewed  CBC WITH DIFFERENTIAL/PLATELET - Abnormal; Notable for the following components:      Result Value  RBC 3.11 (*)    Hemoglobin 9.8 (*)    HCT 31.7 (*)    MCV 101.9 (*)    RDW 16.8 (*)    Lymphs Abs 0.6 (*)    All other components within normal limits  COMPREHENSIVE METABOLIC PANEL - Abnormal; Notable for the following components:   Glucose, Bld 160 (*)    Creatinine, Ser 1.10 (*)    Calcium 8.7 (*)    Total Protein 8.2 (*)    Albumin 3.4 (*)    GFR, Estimated 56 (*)    All other components within normal limits  BRAIN NATRIURETIC PEPTIDE - Abnormal; Notable for the following components:   B Natriuretic Peptide 258.3 (*)    All other components within normal limits  CULTURE, BLOOD (ROUTINE X 2)  CULTURE, BLOOD (ROUTINE X 2)  RESP PANEL BY RT-PCR (FLU A&B, COVID) ARPGX2  LACTIC ACID, PLASMA   ____________________________________________  EKG  ED ECG REPORT I, Hinda Kehr, the attending physician, personally viewed and interpreted this ECG.  Date: 01/08/2021 EKG Time: 03:36 Rate: 70 Rhythm: Sinus rhythm with first-degree AV block QRS Axis:  normal Intervals: PR interval 222 ms ST/T Wave abnormalities: Non-specific ST segment / T-wave changes, but no clear evidence of acute ischemia. Narrative Interpretation: no definitive evidence of acute ischemia; does not meet STEMI criteria.  ____________________________________________  RADIOLOGY I, Hinda Kehr, personally viewed and evaluated these images (plain radiographs) as part of my medical decision making, as well as reviewing the written report by the radiologist.  ED MD interpretation: Chronic appearing changes on chest x-ray.  No clear osteomyelitis on right foot x-ray.  MRI of the right foot with and without contrast pending at time of admission.  Official radiology report(s): DG Chest 2 View  Result Date: 01/07/2021 CLINICAL DATA:  Cough. EXAM: CHEST - 2 VIEW COMPARISON:  None. FINDINGS: Mild, diffuse, chronic appearing increased lung markings are seen. There is no evidence of focal consolidation, pleural effusion or pneumothorax. The cardiac silhouette is moderately enlarged. Marked severity calcification of the aortic arch is seen. Degenerative changes are noted throughout the thoracic spine. IMPRESSION: Chronic appearing increased lung markings without evidence of acute or active cardiopulmonary disease. Electronically Signed   By: Virgina Norfolk M.D.   On: 01/07/2021 20:00   DG Foot Complete Right  Result Date: 01/07/2021 CLINICAL DATA:  History of lymphedema and right foot wounds. EXAM: RIGHT FOOT COMPLETE - 3+ VIEW COMPARISON:  None. FINDINGS: There is no evidence of an acute fracture or dislocation. Mild degenerative changes are seen along the dorsal aspect of the mid right foot. Marked severity diffuse soft tissue swelling is seen. IMPRESSION: Marked severity diffuse soft tissue swelling without evidence of acute osteomyelitis. MRI correlation is recommended if this remains of clinical concern. Electronically Signed   By: Virgina Norfolk M.D.   On: 01/07/2021 20:02     ____________________________________________   PROCEDURES   Procedure(s) performed (including Critical Care):  Procedures   ____________________________________________   INITIAL IMPRESSION / MDM / [place] / ED COURSE  As part of my medical decision making, I reviewed the following data within the [place] notes reviewed and incorporated, Labs reviewed , EKG interpreted , Old chart reviewed, Radiograph reviewed , Discussed with admitting physician , Discussed with radiologist, and Notes from prior ED visits   Differential diagnosis includes, but is not limited to, cellulitis, abscess, necrotizing fasciitis, osteomyelitis.  I personally reviewed the patient's imaging and agree with the radiologist's interpretation that  there is no evidence of acute abnormality on chest x-ray.  There is soft tissue changes on the right foot x-rays but no obvious osteomyelitis, however I think it is possible that she could have some early bone infection, and I am concerned about the soft tissue infection as well.  Even though it is not actively draining purulent material at this time, the photo shown to me was very impressive and I suspect that there may be drainable fluid collection within the foot.  Additionally she appears to need additional debridement by a podiatrist.  Vital signs are reassuring other than hypertension.  Labs are within normal limits including no leukocytosis and essentially normal CMP and normal lactic acid.  However, she is at great risk of severe foot infection, development of necrotizing fasciitis (which I do not think is present at this time), and losing her limb.  I am treating empirically with Zosyn 3.375 g IV and vancomycin per pharmacy protocol.  I ordered blood cultures.  There is nothing to swab right now given no obvious purulence at this moment.  I discussed the case by phone with one of the radiologists and he recommended MRI foot  with and without contrast if I felt it was necessary to further assess possible infection of both the soft tissues and bone, and I think that this will be important for the podiatrist so I have ordered the study to be performed tonight so the information will be available tomorrow.  Consulting hospitalist for admission.       Clinical Course as of 01/08/21 6301  Nancy Fetter Jan 08, 2021  0428 Discussed with Dr. Damita Dunnings with the hospitalist service who will admit. [CF]    Clinical Course User Index [CF] Hinda Kehr, MD     ____________________________________________  FINAL CLINICAL IMPRESSION(S) / ED DIAGNOSES  Final diagnoses:  Right foot infection  Type 2 diabetes mellitus with foot ulcer, unspecified whether long term insulin use ([place])  Lymphedema     MEDICATIONS GIVEN DURING THIS VISIT:  Medications  vancomycin (VANCOREADY) IVPB 2000 mg/400 mL (2,000 mg Intravenous New Bag/Given 01/08/21 0349)    Followed by  vancomycin (VANCOREADY) IVPB 500 mg/100 mL (has no administration in time range)  piperacillin-tazobactam (ZOSYN) IVPB 3.375 g (0 g Intravenous Stopped 01/08/21 0426)     ED Discharge Orders     None        Note:  This document was prepared using Dragon voice recognition software and may include unintentional dictation errors.   Hinda Kehr, MD 01/08/21 [phone]

## 2021-01-08 NOTE — ED Notes (Signed)
RN to bedside. Pt alert and oriented and in no acute distress.

## 2021-01-08 NOTE — H&P (Signed)
History and Physical    Laura Mccarty BHA:[id number] DOB: April 03, 1955 DOA: 01/08/2021  PCP: Orpah Cobb, Vinnie Level, PA-C   Patient coming from: home  I have personally briefly reviewed patient's relevant medical records in [place]  Chief Complaint: wound right foot  HPI: Laura Mccarty is a 65 y.o. female with medical history significant for DM, HTN, CAD s/p PCI, ischemic cardiomyopathy, resolved following revascularization, chronic diastolic heart failure, CKD 3, lymphedema, followed by podiatry, who presents to the ED for evaluation of right foot infection that was noted after removal of [place] boot.  She last saw her podiatrist 3 weeks prior when she had some calluses shaved off but noticed that she was having increasing foot pain.  When the boot was finally removed today they noticed blood and pus oozing from a new wound.  She denies fever or chills.  She does endorse a cough that has been ongoing for about a month, nonproductive, and mild shortness of breath with exertion.  Denies nausea, vomiting, abdominal pain or diarrhea  ED course: BP 186/85, respiratory rate 22 with otherwise normal vitals Blood work: WBC 6.3 with lactic acid 1.1.  Creatinine 1.1 which is its baseline.  BNP 258  EKG, personally viewed and interpreted: Sinus at 70 with first-degree AV block  Imaging: Chest x-ray with chronic appearing increased lung markings without evidence of active disease Right foot x-ray soft tissue swelling without evidence of acute osteomyelitis.  MRI correlation recommended  Patient started on Zosyn and vancomycin.  Hospitalist consulted for admission.    Review of Systems: As per HPI otherwise all other systems on review of systems negative.    Past Medical History:  Diagnosis Date   CHF (congestive heart failure) (HCC)    Diabetes mellitus without complication (HCC)    Hypertension    Kidney disease, chronic, stage III (GFR 30-59 ml/min) (HCC)    Lymphedema      History reviewed. No pertinent surgical history.   reports that she has never smoked. She has never used smokeless tobacco. No history on file for alcohol use and drug use.  Allergies  Allergen Reactions   Codeine Nausea And Vomiting    History reviewed. No pertinent family history.    Prior to Admission medications   Medication Sig Start Date End Date Taking? Authorizing Provider  clopidogrel (PLAVIX) 75 MG tablet Take 75 mg by mouth daily. 09/10/20   [provider]  ergocalciferol (VITAMIN D2) 1.25 MG (50000 UT) capsule Take 1 capsule by mouth every 7 (seven) days. 08/04/20   [provider]  gabapentin (NEURONTIN) 100 MG capsule TAKE 3 CAPSULES BY MOUTH AT NIGHT 06/21/20   [provider]  glipiZIDE (GLUCOTROL XL) 10 MG 24 hr tablet Take 10 mg by mouth daily. 09/10/20   [provider]  lisinopril (ZESTRIL) 20 MG tablet Take by mouth. 10/10/19   [provider]  nitroGLYCERIN (NITROSTAT) 0.4 MG SL tablet Place under the tongue. 05/08/18   [provider]  torsemide (DEMADEX) 20 MG tablet Take 2 tablets by mouth daily. 11/16/19   [provider]  TRULICITY 9.73 ZH/2.9JM SOPN SMARTSIG:0.5 Milliliter(s) SUB-Q Once a Week 11/20/20   [provider]    Physical Exam: Vitals:   01/07/21 1912 01/07/21 1913 01/08/21 0339 01/08/21 0400  BP:  (!) 186/85 (!) 192/68 (!) 190/56  Pulse:  67 69 70  Resp:  (!) 22 14 19   Temp:  97.9 F (36.6 C)    TempSrc:  Oral  SpO2:  95% 93% 94%  Weight: (!) 167.8 kg     Height: 5\' 5"  (1.651 m)      Constitutional: Alert and oriented x 3 . Not in any apparent distress HEENT:      Head: Normocephalic and atraumatic.         Eyes: PERLA, EOMI, Conjunctivae are normal. Sclera is non-icteric.       Mouth/Throat: Mucous membranes are moist.       Neck: Supple with no signs of meningismus. Cardiovascular: Regular rate and rhythm. No murmurs, gallops, or rubs. 2+ symmetrical distal  pulses are present . No JVD.  Hard nonpitting bilateral LE edema Respiratory: Respiratory effort normal .Lungs sounds clear bilaterally. No wheezes, crackles, or rhonchi.  Gastrointestinal: Soft, non tender, non distended. Positive bowel sounds.  Genitourinary: No CVA tenderness. Musculoskeletal: Lymphedema bilateral lower extremities neurologic:  Face is symmetric. Moving all extremities. No gross focal neurologic deficits . Skin: Skin is warm, dry.  Ulcer dorsal aspect right foot with weeping and foul odor  psychiatric: Mood and affect are appropriate    Labs on Admission: I have personally reviewed following labs and imaging studies  CBC: Recent Labs  Lab 01/07/21 1916  WBC 6.3  NEUTROABS 5.1  HGB 9.8*  HCT 31.7*  MCV 101.9*  PLT 854   Basic Metabolic Panel: Recent Labs  Lab 01/07/21 1916  NA 139  K 4.4  CL 106  CO2 28  GLUCOSE 160*  BUN 21  CREATININE 1.10*  CALCIUM 8.7*   GFR: Estimated Creatinine Clearance: 82.6 mL/min (A) (by C-G formula based on SCr of 1.1 mg/dL (H)). Liver Function Tests: Recent Labs  Lab 01/07/21 1916  AST 20  ALT 16  ALKPHOS 126  BILITOT 0.9  PROT 8.2*  ALBUMIN 3.4*   No results for input(s): LIPASE, AMYLASE in the last 168 hours. No results for input(s): AMMONIA in the last 168 hours. Coagulation Profile: No results for input(s): INR, PROTIME in the last 168 hours. Cardiac Enzymes: No results for input(s): CKTOTAL, CKMB, CKMBINDEX, TROPONINI in the last 168 hours. BNP (last 3 results) No results for input(s): PROBNP in the last 8760 hours. HbA1C: No results for input(s): HGBA1C in the last 72 hours. CBG: No results for input(s): GLUCAP in the last 168 hours. Lipid Profile: No results for input(s): CHOL, HDL, LDLCALC, TRIG, CHOLHDL, LDLDIRECT in the last 72 hours. Thyroid Function Tests: No results for input(s): TSH, T4TOTAL, FREET4, T3FREE, THYROIDAB in the last 72 hours. Anemia Panel: No results for input(s): VITAMINB12,  FOLATE, FERRITIN, TIBC, IRON, RETICCTPCT in the last 72 hours. Urine analysis: No results found for: COLORURINE, APPEARANCEUR, LABSPEC, [place], GLUCOSEU, HGBUR, BILIRUBINUR, KETONESUR, PROTEINUR, UROBILINOGEN, NITRITE, LEUKOCYTESUR  Radiological Exams on Admission: DG Chest 2 View  Result Date: 01/07/2021 CLINICAL DATA:  Cough. EXAM: CHEST - 2 VIEW COMPARISON:  None. FINDINGS: Mild, diffuse, chronic appearing increased lung markings are seen. There is no evidence of focal consolidation, pleural effusion or pneumothorax. The cardiac silhouette is moderately enlarged. Marked severity calcification of the aortic arch is seen. Degenerative changes are noted throughout the thoracic spine. IMPRESSION: Chronic appearing increased lung markings without evidence of acute or active cardiopulmonary disease. Electronically Signed   By: Virgina Norfolk M.D.   On: 01/07/2021 20:00   DG Foot Complete Right  Result Date: 01/07/2021 CLINICAL DATA:  History of lymphedema and right foot wounds. EXAM: RIGHT FOOT COMPLETE - 3+ VIEW COMPARISON:  None. FINDINGS: There is no evidence of an acute fracture or dislocation.  Mild degenerative changes are seen along the dorsal aspect of the mid right foot. Marked severity diffuse soft tissue swelling is seen. IMPRESSION: Marked severity diffuse soft tissue swelling without evidence of acute osteomyelitis. MRI correlation is recommended if this remains of clinical concern. Electronically Signed   By: Virgina Norfolk M.D.   On: 01/07/2021 20:02    Assessment/Plan    Cellulitis of right foot   Chronic lymphedema - MRI to evaluate for osteomyelitis - Continue vancomycin and Zosyn - Podiatry consult    Coronary artery disease with history of PCI to LAD - Continue clopidogrel and as needed NTG   Morbid obesity with BMI of 83.6-62.9, adult  -Complicating factor to overall prognosis and care    Type 2 diabetes mellitus with diabetic neuropathy (HCC) - Sliding scale  insulin coverage    Stage 3a chronic kidney disease ([place]) -At baseline.  Continue to monitor    DVT prophylaxis: Lovenox  Code Status: full code  Family Communication:  none  Disposition Plan: Back to previous home environment Consults called: Podiatry Status:At the time of admission, it appears that the appropriate admission status for this patient is INPATIENT. This is judged to be reasonable and necessary in order to provide the required intensity of service to ensure the patient's safety given the presenting symptoms, physical exam findings, and initial radiographic and laboratory data in the context of their  Comorbid conditions.   Patient requires inpatient status due to high intensity of service, high risk for further deterioration and high frequency of surveillance required.   I certify that at the point of admission it is my clinical judgment that the patient will require inpatient hospital care spanning beyond [facility] MD [facility]   01/08/2021, 5:22 AM

## 2021-01-08 NOTE — Consult Note (Signed)
Subjective:  Patient ID: Laura Mccarty, female    DOB: November 10, 1955,  MRN: [id number]  Patient with past medical history of DM, HTN, CAD s/p PCI, ischemic cardiomyopathy, resolved following revascularization, chronic diastolic heart failure, CKD 3, lymphedema seen at beside today for right foot wound infection. She  was seen by Dr. Sherryle Lis 3 weeks ago and had the wound debrided. Has been using iodosorb and an unna boot on the wound. Relates her home health nurse was changing the wound yesterday and was concerned and recommended presenting to the ED. Patient relates soreness in the right foot. States it has been draining and had malodor off and on. Denies any nausea, vomiting, fever and chills. She is diabetic. Has a history of lymphedema.     Past Medical History:  Diagnosis Date   CHF (congestive heart failure) (HCC)    Diabetes mellitus without complication (HCC)    Hypertension    Kidney disease, chronic, stage III (GFR 30-59 ml/min) (HCC)    Lymphedema      History reviewed. No pertinent surgical history.  CBC Latest Ref Rng & Units 01/07/2021 10/22/2020  WBC 4.0 - 10.5 K/uL 6.3 6.0  Hemoglobin 12.0 - 15.0 g/dL 9.8(L) 10.3(L)  Hematocrit 36.0 - 46.0 % 31.7(L) 32.0(L)  Platelets 150 - 400 K/uL 288 285    BMP Latest Ref Rng & Units 01/07/2021 10/22/2020  Glucose 70 - 99 mg/dL 160(H) 127(H)  BUN 8 - 23 mg/dL 21 22  Creatinine 0.44 - 1.00 mg/dL 1.10(H) 1.27(H)  Sodium 135 - 145 mmol/L 139 141  Potassium 3.5 - 5.1 mmol/L 4.4 4.4  Chloride 98 - 111 mmol/L 106 106  CO2 22 - 32 mmol/L 28 28  Calcium 8.9 - 10.3 mg/dL 8.7(L) 8.9     Objective:   Vitals:   01/08/21 0339 01/08/21 0400  BP: (!) 192/68 (!) 190/56  Pulse: 69 70  Resp: 14 19  Temp:    SpO2: 93% 94%    General:AA&O x 3. Normal mood and affect   Vascular: DP and PT pulses nonpalpable bilateral due to lymphedema. Brisk capillary refill to all digits. Severe lyphedema present to bilateral lower extremiities.    Neruological. Epicritic sensation grossly intact.   Derm: Right plantar fifth metatarsal wound with fibrogranular base measuring 1 cm x 1.3 cm x 0.2 cm. Moderate erythema and edema present. No purulence. No probe to bone. Interspaces clears of maceration. Nails well groomed and normal in appearance. Xerosis noted to dorsal and plantar foot bilateral. Small abrasion noted to lateral left foot upon debridement of hyperkeratotic tissue on lateral. Erythema surrounding.        MSK: MMT 5/5 in dorsiflexion, plantar flexion, inversion and eversion. Normal joint ROM without pain or crepitus.    Right foot MRI IMPRESSION: 1. Focal soft tissue ulcer plantar to the 5th MTP joint without focal fluid collection. Extensive subcutaneous edema and ill-defined fluid throughout the foot consistent with soft tissue infection (cellulitis). 2. No evidence of septic arthritis or osteomyelitis adjacent to the lateral plantar foot ulcer. 3. Nonspecific T2 marrow changes and enhancement within the 1st metatarsal head, 1st proximal phalangeal head and 3rd metatarsal head without associated T1 signal abnormality, cortical destruction or significant joint effusion     Assessment & Plan:  Patient was evaluated and treated and all questions answered.  DX: Right diabetic foot ulcer with fat layer exposed. Bilateral lymphedema Wound care: betadine, DSD.  Antibiotics: Per primary  DME: Post-op shoe  Discussed with patient diagnosis and treatment options.  Imaging reviewed. Radiographs with no evidence of osseous erosions.  MRI reviewed with no osteomyelitis noted or abscess noted.  Discussed wound care and treatment with patient. Do not recommend any surgical intervention at this time as infection appears to be superficial and should respond well to antibiotics.  Wound was debrided at bedside today without complication. Procedure below.  Recommend continuing with iodosorb and dry dressing at home.  Patient  to follow-up with Dr. Sherryle Lis upon discharge.  Patient in agreement with plan and all questions answered.  Podiatry to sign off at this time.   Lorenda Peck, MD     Procedure: Excisional Debridement of Wound Rationale: Removal of non-viable soft tissue from the wound to promote healing.  Anesthesia: none Pre-Debridement Wound Measurements: 0.2 cm x 0.2 cm x 0.1 cm  Post-Debridement Wound Measurements: 1.5 cm x 1 cm x 0.2 cm  Type of Debridement: Sharp Excisional Tissue Removed: Non-viable soft tissue Depth of Debridement: subcutaneous tissue. Technique: Sharp excisional debridement to bleeding, viable wound base.  Dressing: Dry, sterile, compression dressing. Disposition: Patient tolerated procedure well.   No follow-ups on file.   Accessible via secure chat for questions or concerns.

## 2021-01-08 NOTE — Progress Notes (Signed)
PHARMACY -  BRIEF ANTIBIOTIC NOTE   Pharmacy has received consult(s) for Vancomcyin from an ED provider.  The patient's profile has been reviewed for ht/wt/allergies/indication/available labs.    One time order(s) placed for Vancomycin 2500 mg IV X 1  Further antibiotics/pharmacy consults should be ordered by admitting physician if indicated.                       Thank you, Tenlee Wollin D 01/08/2021  3:20 AM

## 2021-01-09 DIAGNOSIS — L03115 Cellulitis of right lower limb: Secondary | ICD-10-CM | POA: Diagnosis not present

## 2021-01-09 LAB — HEMOGLOBIN A1C
Hgb A1c MFr Bld: 7.4 % — ABNORMAL HIGH (ref 4.8–5.6)
Mean Plasma Glucose: 166 mg/dL

## 2021-01-09 LAB — CREATININE, SERUM
Creatinine, Ser: 1.42 mg/dL — ABNORMAL HIGH (ref 0.44–1.00)
GFR, Estimated: 41 mL/min — ABNORMAL LOW (ref >60)

## 2021-01-09 LAB — HIV ANTIBODY (ROUTINE TESTING W REFLEX): HIV Screen 4th Generation wRfx: NONREACTIVE

## 2021-01-09 LAB — GLUCOSE, CAPILLARY
Glucose-Capillary: 128 mg/dL — ABNORMAL HIGH (ref 70–99)
Glucose-Capillary: 134 mg/dL — ABNORMAL HIGH (ref 70–99)
Glucose-Capillary: 189 mg/dL — ABNORMAL HIGH (ref 70–99)

## 2021-01-09 MED ORDER — ATORVASTATIN CALCIUM 20 MG PO TABS
80.0000 mg | ORAL_TABLET | Freq: Every evening | ORAL | Status: DC
  Administered 2021-01-09: 18:00:00 80 mg via ORAL
  Filled 2021-01-09: qty 4

## 2021-01-09 MED ORDER — CARVEDILOL 6.25 MG PO TABS
6.2500 mg | ORAL_TABLET | Freq: Two times a day (BID) | ORAL | Status: DC
  Administered 2021-01-09 – 2021-01-10 (×3): 6.25 mg via ORAL
  Filled 2021-01-09 (×3): qty 1

## 2021-01-09 MED ORDER — GABAPENTIN 100 MG PO CAPS
100.0000 mg | ORAL_CAPSULE | Freq: Every day | ORAL | Status: DC
  Administered 2021-01-09: 22:00:00 100 mg via ORAL
  Filled 2021-01-09: qty 1

## 2021-01-09 MED ORDER — ASPIRIN EC 81 MG PO TBEC
81.0000 mg | DELAYED_RELEASE_TABLET | Freq: Every day | ORAL | Status: DC
  Administered 2021-01-09 – 2021-01-10 (×2): 81 mg via ORAL
  Filled 2021-01-09 (×2): qty 1

## 2021-01-09 MED ORDER — CLOPIDOGREL BISULFATE 75 MG PO TABS
75.0000 mg | ORAL_TABLET | Freq: Every day | ORAL | Status: DC
  Administered 2021-01-09 – 2021-01-10 (×2): 75 mg via ORAL
  Filled 2021-01-09 (×2): qty 1

## 2021-01-09 MED ORDER — TORSEMIDE 20 MG PO TABS
40.0000 mg | ORAL_TABLET | Freq: Every day | ORAL | Status: DC
  Administered 2021-01-09: 11:00:00 40 mg via ORAL
  Filled 2021-01-09 (×2): qty 2

## 2021-01-09 MED ORDER — INFLUENZA VAC SPLIT QUAD 0.5 ML IM SUSY
0.5000 mL | PREFILLED_SYRINGE | INTRAMUSCULAR | Status: AC
  Administered 2021-01-10: 0.5 mL via INTRAMUSCULAR
  Filled 2021-01-09 (×2): qty 0.5

## 2021-01-09 MED ORDER — VANCOMYCIN HCL 1500 MG/300ML IV SOLN
1500.0000 mg | INTRAVENOUS | Status: DC
  Administered 2021-01-10: 1500 mg via INTRAVENOUS
  Filled 2021-01-09: qty 300

## 2021-01-09 NOTE — Care Plan (Signed)
  Patient seen and examined personally, I reviewed the chart, history and physical and admission note, done by admitting physician this morning and agree with the same with following addendum.  Please refer to the morning admission note for more detailed plan of care.   Briefly,    65 year old female with diabetes, HTN, CAD with PCI, ischemic cardiomyopathy, chronic diastolic CHF CKD stage III, lymphedema followed by podiatry presents to the ED with a right foot infection after removal of her Unna boot last seen by her podiatrist 3 weeks PTA when she had some calluses that shaved off but noticed she was having increasing foot pain. In the ED hypertensive, normal lactic acid and WBC count EKG sinus rhythm, chest x-ray no active disease right foot x-ray shows no evidence of acute osteomyelitis MRI ordered placed on antibiotics podiatry consulted and admitted   Seen in the ED hallway she is alert awake resting comfortably.  Has bilateral chronic lymphedema  LE see pic EXTR   Cellulitis of the right foot in the setting of diabetes and chronic lymphedema: Continue empiric antibiotics, follow-up MRI, and podiatry recommendation. MRI reviewed shows focal soft tissue ulcer on the plantar to the fifth MTP joint without focal fluid collection extensive subcutaneous edema and ill-defined fluid throughout the foot consistent with soft tissue infection and cellulitis no evidence of septic arthritis or osteomyelitis.     Other issues are chronic and stable see HPI   CAD PCI to LAD Essential hypertension Ischemic cardiomyopathy-BNP 258 slightly high T2DM Anemia of chronic disease hemoglobin 9.8 on admission. CKD stage III Morbid obesity with BMI 61

## 2021-01-09 NOTE — ED Notes (Signed)
This RN and Editor, commissioning provided pt pericare, clean pads, and brief d/t purewick leaking.

## 2021-01-09 NOTE — Care Management (Signed)
Bed side RN notified of order for post op shoe

## 2021-01-09 NOTE — Progress Notes (Signed)
PROGRESS NOTE    Laura Mccarty  NFA:[id number] DOB: June 04, 1955 DOA: 01/08/2021 PCP: Orpah Cobb, Vinnie Level, PA-C   Chief Complaint  Patient presents with   Foot Pain   Leg Swelling  Brief Narrative/Hospital Course:  Laura Mccarty, 65 y.o. female with PMH of diabetes, HTN, CAD with PCI, ischemic cardiomyopathy, chronic diastolic CHF CKD stage III, lymphedema followed by podiatry presents to the ED with a right foot infection after removal of her Unna boot last seen by her podiatrist 3 weeks PTA when she had some calluses that shaved off but noticed she was having increasing foot pain. In the ED hypertensive, normal lactic acid and WBC count EKG sinus rhythm, chest x-ray no active disease right foot x-ray shows no evidence of acute osteomyelitis MRI ordered placed on antibiotics podiatry consulted and admitted   Subjective: Seen and examined this morning.No new complaints. Overnight afebrile Hemoglobin 8.8 g.  Assessment & Plan:  Cellulitis of the right foot in the setting of diabetes and chronic lymphedema: Podiatry has done bedside debridement 11/27 and recommended wound care.Continue empiric antibiotics. MRI reviewed shows focal soft tissue ulcer on the plantar to the fifth MTP joint without focal fluid collection extensive subcutaneous edema and ill-defined fluid throughout the foot consistent with soft tissue infection and cellulitis no evidence of septic arthritis or osteomyelitis.  Keep on IV antibiotics 24 hours plan for discharge tomorrow   CAD PCI to LAD Essential hypertension: Ischemic cardiomyopathy-BNP 258 slightly high Blood pressure is well controlled.  Volume status compensated.  No chest pain. Resume home aspirin.  Lipitor Coreg Plavix and torsemide Has chronic lymphedema respiratory status stable.  T2DM: Blood sugar well controlled update HbA1c,continue on sliding scale insulin.  Hold glipizide. No results found for: HGBA1C  Recent Labs  Lab 01/08/21 0813  01/08/21 1921 01/08/21 2139 01/09/21 0740  GLUCAP 169* 172* 166* 128*    Anemia of chronic disease hemoglobin 9.8 on admission.8.8 monitor  CKD stage IIIa: Monitor creatinine slightly uptrending. Recent Labs  Lab 01/07/21 1916 01/08/21 2008  BUN 21  --   CREATININE 1.10* 1.30*    Class III Obesity/super morbid:Patient's Body mass index is 60.53 kg/m. : Will benefit with PCP follow-up, weight loss  healthy lifestyle and outpatient sleep evaluation.  DVT prophylaxis: Lovenox Code Status:   Code Status: Full Code Family Communication: plan of care discussed with patient at bedside. Status is: Inpatient Remains inpatient appropriate because: For ongoing management of lower extremity cellulitis Disposition: Currently not medically stable for discharge. Anticipated Disposition: Obtain PT OT eval for disposition.  Patient reports she is unable to get around the house.  Objective: Vitals last 24 hrs: Vitals:   01/09/21 0000 01/09/21 0300 01/09/21 0522 01/09/21 0537  BP: (!) 140/44 (!) 123/47 (!) 123/51 138/63  Pulse: 64 68 63 70  Resp: 16 16 16 20   Temp:    99.4 F (37.4 C)  TempSrc:    Oral  SpO2: 93% (!) 89% 100% 96%  Weight:    (!) 165 kg  Height:    5\' 5"  (1.651 m)   Weight change: -2.831 kg No intake or output data in the 24 hours ending 01/09/21 0809 Net IO Since Admission: 400 mL [01/09/21 0809]   Physical Examination: General exam: AA0x3, weak,older than stated age. HEENT:Oral mucosa moist, Ear/Nose WNL grossly,dentition normal. Respiratory system: B/l diminished BS, no use of accessory muscle, non tender. Cardiovascular system: S1 & S2 +,No JVD. Gastrointestinal system: Abdomen soft, NT,ND, BS+. Nervous System:Alert, awake, moving extremities. Extremities: Bilateral lymphedema  with right foot having dressing -did not remove it was changed by podiatry yesterday Skin: No rashes, no icterus. MSK: Normal muscle bulk, tone, power.  Medications reviewed:  Scheduled  Meds:  aspirin EC  81 mg Oral Daily   atorvastatin  80 mg Oral QPM   carvedilol  6.25 mg Oral BID   clopidogrel  75 mg Oral Daily   enoxaparin (LOVENOX) injection  0.5 mg/kg Subcutaneous Q24H   gabapentin  100-300 mg Oral QHS   [START ON 01/10/2021] influenza vac split quadrivalent PF  0.5 mL Intramuscular Tomorrow-1000   insulin aspart  0-20 Units Subcutaneous TID WC   insulin aspart  0-5 Units Subcutaneous QHS   torsemide  40 mg Oral Daily   Continuous Infusions:  piperacillin-tazobactam (ZOSYN)  IV Stopped (01/09/21 0514)   vancomycin      Diet Order             Diet heart healthy/carb modified Room service appropriate? Yes; Fluid consistency: Thin  Diet effective now                          Weight change: -2.831 kg  Wt Readings from Last 3 Encounters:  01/09/21 (!) 165 kg  10/22/20 (!) 165.3 kg     Consultants:see note  Procedures:see note Antimicrobials: Anti-infectives (From admission, onward)    Start     Dose/Rate Route Frequency Ordered Stop   01/09/21 0600  vancomycin (VANCOREADY) IVPB 1750 mg/350 mL        1,750 mg 175 mL/hr over 120 Minutes Intravenous Every 24 hours 01/08/21 0628     01/08/21 1000  piperacillin-tazobactam (ZOSYN) IVPB 3.375 g        3.375 g 12.5 mL/hr over 240 Minutes Intravenous Every 8 hours 01/08/21 0626     01/08/21 0330  vancomycin (VANCOREADY) IVPB 2000 mg/400 mL       See Hyperspace for full Linked Orders Report.   2,000 mg 200 mL/hr over 120 Minutes Intravenous  Once 01/08/21 0319 01/08/21 0747   01/08/21 0330  vancomycin (VANCOREADY) IVPB 500 mg/100 mL       See Hyperspace for full Linked Orders Report.   500 mg 100 mL/hr over 60 Minutes Intravenous  Once 01/08/21 0319 01/08/21 0854   01/08/21 0315  piperacillin-tazobactam (ZOSYN) IVPB 3.375 g        3.375 g 100 mL/hr over 30 Minutes Intravenous  Once 01/08/21 0310 01/08/21 0426   01/08/21 0315  vancomycin (VANCOCIN) IVPB 1000 mg/200 mL premix  Status:  Discontinued         1,000 mg 200 mL/hr over 60 Minutes Intravenous  Once 01/08/21 0310 01/08/21 0318      Culture/Microbiology    Component Value Date/Time   SDES LEFT ANTECUBITAL 01/08/2021 0342   SDES BLOOD LEFT HAND 01/08/2021 0342   SPECREQUEST  01/08/2021 0342    BOTTLES DRAWN AEROBIC AND ANAEROBIC Blood Culture adequate volume   SPECREQUEST  01/08/2021 0342    BOTTLES DRAWN AEROBIC AND ANAEROBIC Blood Culture results may not be optimal due to an inadequate volume of blood received in culture bottles   CULT  01/08/2021 0342    NO GROWTH <12 HOURS Performed at [facility], [address] [place], [place] [address]    CULT  01/08/2021 0342    NO GROWTH <12 HOURS Performed at [facility], [address] [place], [place] [address]    REPTSTATUS PENDING 01/08/2021 0342   REPTSTATUS PENDING 01/08/2021  0342    Other culture-see note  Unresulted Labs (From admission, onward)     Start     Ordered   01/15/21 0500  Creatinine, serum  (enoxaparin (LOVENOX)    CrCl >/= 30 ml/min)  Weekly,   STAT     Comments: while on enoxaparin therapy    01/08/21 0531   01/09/21 0500  Creatinine, serum  Tomorrow morning,   STAT        01/08/21 1254   01/09/21 0500  HIV Antibody (routine testing w rflx)  Once,   STAT        01/09/21 0500   01/08/21 0530  Hemoglobin A1c  Once,   STAT       Comments: To assess prior glycemic control    01/08/21 0531          Data Reviewed: I have personally reviewed following labs and imaging studies CBC: Recent Labs  Lab 01/07/21 1916 01/08/21 2008  WBC 6.3 5.2  NEUTROABS 5.1  --   HGB 9.8* 8.8*  HCT 31.7* 28.9*  MCV 101.9* 102.1*  PLT 288 824   Basic Metabolic Panel: Recent Labs  Lab 01/07/21 1916 01/08/21 2008  NA 139  --   K 4.4  --   CL 106  --   CO2 28  --   GLUCOSE 160*  --   BUN 21  --   CREATININE 1.10* 1.30*  CALCIUM 8.7*  --    GFR: Estimated Creatinine Clearance: 69.2 mL/min (A) (by C-G formula based on SCr of  1.3 mg/dL (H)). Liver Function Tests: Recent Labs  Lab 01/07/21 1916  AST 20  ALT 16  ALKPHOS 126  BILITOT 0.9  PROT 8.2*  ALBUMIN 3.4*   No results for input(s): LIPASE, AMYLASE in the last 168 hours. No results for input(s): AMMONIA in the last 168 hours. Coagulation Profile: No results for input(s): INR, PROTIME in the last 168 hours. Cardiac Enzymes: No results for input(s): CKTOTAL, CKMB, CKMBINDEX, TROPONINI in the last 168 hours. BNP (last 3 results) No results for input(s): PROBNP in the last 8760 hours. HbA1C: No results for input(s): HGBA1C in the last 72 hours. CBG: Recent Labs  Lab 01/08/21 0813 01/08/21 1921 01/08/21 2139 01/09/21 0740  GLUCAP 169* 172* 166* 128*   Lipid Profile: No results for input(s): CHOL, HDL, LDLCALC, TRIG, CHOLHDL, LDLDIRECT in the last 72 hours. Thyroid Function Tests: No results for input(s): TSH, T4TOTAL, FREET4, T3FREE, THYROIDAB in the last 72 hours. Anemia Panel: No results for input(s): VITAMINB12, FOLATE, FERRITIN, TIBC, IRON, RETICCTPCT in the last 72 hours. Sepsis Labs: Recent Labs  Lab 01/07/21 1916  LATICACIDVEN 1.1    Recent Results (from the past 240 hour(s))  Blood culture (routine x 2)     Status: None (Preliminary result)   Collection Time: 01/08/21  3:42 AM   Specimen: Left Antecubital; Blood  Result Value Ref Range Status   Specimen Description LEFT ANTECUBITAL  Final   Special Requests   Final    BOTTLES DRAWN AEROBIC AND ANAEROBIC Blood Culture adequate volume   Culture   Final    NO GROWTH <12 HOURS Performed at [facility], [address] [place], [place] [address]    Report Status PENDING  Incomplete  Blood culture (routine x 2)     Status: None (Preliminary result)   Collection Time: 01/08/21  3:42 AM   Specimen: BLOOD LEFT HAND  Result Value Ref Range Status   Specimen Description BLOOD LEFT HAND  Final   Special Requests   Final    BOTTLES DRAWN AEROBIC AND ANAEROBIC Blood  Culture results may not be optimal due to an inadequate volume of blood received in culture bottles   Culture   Final    NO GROWTH <12 HOURS Performed at [facility], [address] [place], [place] [address]    Report Status PENDING  Incomplete  Resp Panel by RT-PCR (Flu A&B, Covid) Nasopharyngeal Swab     Status: None   Collection Time: 01/08/21  3:56 AM   Specimen: Nasopharyngeal Swab; Nasopharyngeal(NP) swabs in vial transport medium  Result Value Ref Range Status   SARS Coronavirus 2 by RT PCR NEGATIVE NEGATIVE Final    Comment: (NOTE) SARS-CoV-2 target nucleic acids are NOT DETECTED.  The SARS-CoV-2 RNA is generally detectable in upper respiratory specimens during the acute phase of infection. The lowest concentration of SARS-CoV-2 viral copies this assay can detect is 138 copies/mL. A negative result does not preclude SARS-Cov-2 infection and should not be used as the sole basis for treatment or other patient management decisions. A negative result may occur with  improper specimen collection/handling, submission of specimen other than nasopharyngeal swab, presence of viral mutation(s) within the areas targeted by this assay, and inadequate number of viral copies(<138 copies/mL). A negative result must be combined with clinical observations, patient history, and epidemiological information. The expected result is Negative.  Fact Sheet for Patients:  EntrepreneurPulse.com.au  Fact Sheet for Healthcare Providers:  IncredibleEmployment.be  This test is no t yet approved or cleared by the [place] FDA and  has been authorized for detection and/or diagnosis of SARS-CoV-2 by FDA under an Emergency Use Authorization (EUA). This EUA will remain  in effect (meaning this test can be used) for the duration of the COVID-19 declaration under Section 564(b)(1) of the Act, 21 U.S.C.section 360bbb-3(b)(1), unless the authorization is  terminated  or revoked sooner.       Influenza A by PCR NEGATIVE NEGATIVE Final   Influenza B by PCR NEGATIVE NEGATIVE Final    Comment: (NOTE) The Xpert Xpress SARS-CoV-2/FLU/RSV plus assay is intended as an aid in the diagnosis of influenza from Nasopharyngeal swab specimens and should not be used as a sole basis for treatment. Nasal washings and aspirates are unacceptable for Xpert Xpress SARS-CoV-2/FLU/RSV testing.  Fact Sheet for Patients: EntrepreneurPulse.com.au  Fact Sheet for Healthcare Providers: IncredibleEmployment.be  This test is not yet approved or cleared by the [place] FDA and has been authorized for detection and/or diagnosis of SARS-CoV-2 by FDA under an Emergency Use Authorization (EUA). This EUA will remain in effect (meaning this test can be used) for the duration of the COVID-19 declaration under Section 564(b)(1) of the Act, 21 U.S.C. section 360bbb-3(b)(1), unless the authorization is terminated or revoked.  Performed at [facility], [address], [place], [place] [address]      Radiology Studies: DG Chest 2 View  Result Date: 01/07/2021 CLINICAL DATA:  Cough. EXAM: CHEST - 2 VIEW COMPARISON:  None. FINDINGS: Mild, diffuse, chronic appearing increased lung markings are seen. There is no evidence of focal consolidation, pleural effusion or pneumothorax. The cardiac silhouette is moderately enlarged. Marked severity calcification of the aortic arch is seen. Degenerative changes are noted throughout the thoracic spine. IMPRESSION: Chronic appearing increased lung markings without evidence of acute or active cardiopulmonary disease. Electronically Signed   By: Virgina Norfolk M.D.   On: 01/07/2021 20:00   MR FOOT RIGHT W WO CONTRAST  Result Date: 01/08/2021 CLINICAL DATA:  Diabetic with right foot swelling. Concern for cellulitis/abscess and osteomyelitis. Wound infection with drainage. EXAM: MRI OF  THE RIGHT FOREFOOT WITHOUT AND WITH CONTRAST TECHNIQUE: Multiplanar, multisequence MR imaging of the right forefoot was performed before and after the administration of intravenous contrast. CONTRAST:  46mL GADAVIST GADOBUTROL 1 MMOL/ML IV SOLN COMPARISON:  Radiographs same date. FINDINGS: Bones/Joint/Cartilage There is an apparent open wound over the plantar aspect of the 5th metatarsophalangeal joint. No bone marrow signal abnormalities or abnormal enhancement are identified within the 5th metatarsal or the small toe. There is nonspecific low-level marrow T2 hyperintensity and enhancement within the 3rd metatarsal head as well as the head of the 1st metatarsal and 1st proximal phalanx. No cortical destruction, abnormal T1 marrow signal or significant joint effusions are present at these levels. There is mild nonspecific synovial enhancement at the 1st and 3rd metatarsophalangeal joints. The alignment is normal at the Lisfranc joint. Ligaments Intact Lisfranc ligament. The collateral ligaments of the metatarsophalangeal joints appear intact. Muscles and Tendons Generalized forefoot muscular atrophy without focal fluid collection or abnormal enhancement. The forefoot tendons appear intact without significant tenosynovitis. Soft tissues As seen radiographically, there is severe subcutaneous edema and ill-defined fluid throughout the foot, greatest dorsally. No focal fluid collections are identified. There is skin and soft tissue ulceration of the plantar aspect of the 5th metatarsophalangeal joint with surrounding soft tissue enhancement extending to the joint. As above, no evidence septic arthritis or osteomyelitis adjacent to this ulcer. There is decreased T1 signal within the subcutaneous fat plantar to the 1st MTP joint without abnormal enhancement or apparent overlying skin ulceration, likely an incidental pressure lesion. IMPRESSION: 1. Focal soft tissue ulcer plantar to the 5th MTP joint without focal fluid  collection. Extensive subcutaneous edema and ill-defined fluid throughout the foot consistent with soft tissue infection (cellulitis). 2. No evidence of septic arthritis or osteomyelitis adjacent to the lateral plantar foot ulcer. 3. Nonspecific T2 marrow changes and enhancement within the 1st metatarsal head, 1st proximal phalangeal head and 3rd metatarsal head without associated T1 signal abnormality, cortical destruction or significant joint effusion. Electronically Signed   By: Richardean Sale M.D.   On: 01/08/2021 09:22   DG Foot Complete Right  Result Date: 01/07/2021 CLINICAL DATA:  History of lymphedema and right foot wounds. EXAM: RIGHT FOOT COMPLETE - 3+ VIEW COMPARISON:  None. FINDINGS: There is no evidence of an acute fracture or dislocation. Mild degenerative changes are seen along the dorsal aspect of the mid right foot. Marked severity diffuse soft tissue swelling is seen. IMPRESSION: Marked severity diffuse soft tissue swelling without evidence of acute osteomyelitis. MRI correlation is recommended if this remains of clinical concern. Electronically Signed   By: Virgina Norfolk M.D.   On: 01/07/2021 20:02     LOS: 1 day   Antonieta Pert, MD [facility]  01/09/2021, 8:09 AM

## 2021-01-09 NOTE — ED Notes (Signed)
Pt resting with eyes closed, respirations even and unlabored.

## 2021-01-09 NOTE — Evaluation (Signed)
Occupational Therapy Evaluation Patient Details Name: Laura Mccarty MRN: [id number] DOB: 05-06-1955 Today's Date: 01/09/2021   History of Present Illness Laura Mccarty is a 65yoF who comes to [facility] on 11/26 c RLE infection, advised to come to ED via homehealth team. BP 800/67mHg upon arrival. PMH: DM, HTN, CAD s/p PCI, ICM, dCHF, CKD3, lymphedema. Pt admitted with cellulitis of Rt foot. Pt is s/p foot MRI 11/27, results include "No evidence of septic arthritis or osteomyelitis adjacent to the lateral plantar foot ulcer; Nonspecific T2 marrow changes and enhancement within the 1st metatarsal head, 1st proximal phalangeal head and 3rd metatarsal head without associated T1 signal abnormality,"   Clinical Impression   Pt was seen for OT evaluation this date. Prior to hospital admission, pt and spouse were managing at home with lift recliner as primary area used in the home and pt using rollator for short household distance mobility and requiring assist for LB ADL and toileting from spouse. Currently pt demonstrates impairments as described below (See OT problem list) which functionally limit her ability to perform ADL/self-care tasks. Pt currently requires MAX A for bed level LB ADL and MIN A for UB ADL. OOB not attempted due to no post-op shoe present. RN notified and aware. Pt would benefit from skilled OT services to address noted impairments and functional limitations (see below for any additional details) in order to maximize safety and independence while minimizing falls risk and caregiver burden. Upon hospital discharge, recommend HHOT to maximize pt safety and return to functional independence during meaningful occupations of daily life. TOC notified of DME recommendations and spouse's question regarding DME.      Recommendations for follow up therapy are one component of a multi-disciplinary discharge planning process, led by the attending physician.  Recommendations may be updated based on patient  status, additional functional criteria and insurance authorization.   Follow Up Recommendations  Home health OT    Assistance Recommended at Discharge Intermittent Supervision/Assistance  Functional Status Assessment  Patient has had a recent decline in their functional status and demonstrates the ability to make significant improvements in function in a reasonable and predictable amount of time.  Equipment Recommendations  Hospital bed;Other (comment) (mechanical lift, bariatric BSC, urine suction system similar to Pure Wick)    Recommendations for Other Services       Precautions / Restrictions Precautions Precautions: Fall Restrictions Weight Bearing Restrictions: No Other Position/Activity Restrictions: Postop shoe required RLE, does not yet have in room. RN notified      Mobility Bed Mobility Overal bed mobility: Needs Assistance Bed Mobility: Rolling;Supine to Sit Rolling: Mod assist   Supine to sit: Mod assist (supine to long sitting with modA BUE hand held assist)     General bed mobility comments: Pt required MOD A and use of bed rails for in bed mobility, EOB deferred    Transfers                   General transfer comment: unsafe to attempt this date, no post-op shoe present in room. RN aware      Balance Overall balance assessment: Modified Independent                                         ADL either performed or assessed with clinical judgement   ADL Overall ADL's : Needs assistance/impaired  General ADL Comments: Pt currently requires near TOTAL A for LB bathing and dressing from bed level position, MIN A for UB dressing, and bed level toileting assist for bed pan/purewick.     Vision Baseline Vision/History: 65 Retinopathy Ability to See in Adequate Light: 2 Moderately impaired Patient Visual Report: No change from baseline       Perception     Praxis       Pertinent Vitals/Pain Pain Assessment: No/denies pain     Hand Dominance     Extremity/Trunk Assessment Upper Extremity Assessment Upper Extremity Assessment: Generalized weakness   Lower Extremity Assessment Lower Extremity Assessment: Generalized weakness (significant lymphedema, very edematous feet, R foot bandaged)       Communication Communication Communication: No difficulties   Cognition Arousal/Alertness: Awake/alert Behavior During Therapy: WFL for tasks assessed/performed Overall Cognitive Status: Within Functional Limits for tasks assessed                                       General Comments       Exercises  Other Exercises: AE/DME education and problem solving   Shoulder Instructions      Home Living Family/patient expects to be discharged to:: Private residence Living Arrangements: Spouse/significant other;Children;Other relatives (3  grandDTR (77yo, 61yo, 70yo), DTR adnDTR's boyfriend) Available Help at Discharge: Family Type of Home: Apartment Home Access: Level entry     Home Layout: One level     Bathroom Shower/Tub: Sponge bathes at baseline         Home Equipment: Rollator (4 wheels);Wheelchair - Publishing copy (2 wheels)   Additional Comments: WC typically for out of home mobility; WC has no cushion, but does have bilat elevated leg rests;      Prior Functioning/Environment Prior Level of Function : Needs assist       Physical Assist : ADLs (physical);Mobility (physical) Mobility (physical): Bed mobility;Transfers ADLs (physical): Bathing;Dressing;Toileting;Grooming Mobility Comments: Has not been taking lasix recently, and has not been able to utilize compression therapies (has pneumatic compression device at home due to urinary frequency incontinence) ADLs Comments: diabetic retinopathy with VI; uses toilet in BR which is not WC accessible; Husband provides set-up assist with toiletting as needed; sleeps in  recliner liftchair at baseline cannot stand without lift. MOD-MAX A for LB dressing and bathing (sink bath from lift recliner). Spouse manages all meals and cleaning.        OT Problem List: Impaired balance (sitting and/or standing);Obesity;Decreased activity tolerance;Decreased strength;Decreased knowledge of use of DME or AE      OT Treatment/Interventions: Self-care/ADL training;Therapeutic exercise;Therapeutic activities;DME and/or AE instruction;Patient/family education;Balance training;Visual/perceptual remediation/compensation    OT Goals(Current goals can be found in the care plan section) Acute Rehab OT Goals Patient Stated Goal: get stronger and go home OT Goal Formulation: With patient/family Time For Goal Achievement: 01/23/21 Potential to Achieve Goals: Good ADL Goals Pt Will Transfer to Toilet: bedside commode;with min assist;stand pivot transfer (LRAD PRN) Additional ADL Goal #1: Pt/spouse will be mod indep with post-op shoe mgt Additional ADL Goal #2: Pt will perform bed mobility with MIN A in anticipation for EOB/OOB ADL.  OT Frequency: Min 2X/week   Barriers to D/C:            Co-evaluation              AM-PAC OT "6 Clicks" Daily Activity     Outcome Measure Help from another  person eating meals?: None Help from another person taking care of personal grooming?: A Little Help from another person toileting, which includes using toliet, bedpan, or urinal?: A Lot Help from another person bathing (including washing, rinsing, drying)?: A Lot Help from another person to put on and taking off regular upper body clothing?: A Little Help from another person to put on and taking off regular lower body clothing?: A Lot 6 Click Score: 16   End of Session Nurse Communication: Other (comment) (need for post-op shoe)  Activity Tolerance: Patient tolerated treatment well Patient left: in bed;with call bell/phone within reach;with bed alarm set;with family/visitor  present;Other (comment) (BLE elevated)  OT Visit Diagnosis: Other abnormalities of gait and mobility (R26.89);Muscle weakness (generalized) (M62.81)                Time: 8616-8372 OT Time Calculation (min): 15 min Charges:  OT General Charges $OT Visit: 1 Visit OT Evaluation $OT Eval Moderate Complexity: 1 Mod  Ardeth Perfect., MPH, MS, OTR/L ascom [phone] 01/09/21, 5:16 PM

## 2021-01-09 NOTE — Evaluation (Addendum)
Physical Therapy Evaluation Patient Details Name: Laura Mccarty MRN: [id number] DOB: [birth year] Today's Date: 01/09/2021  History of Present Illness  Zhavia Cunanan is a 65yoF who comes to [facility] on 11/26 c RLE infection, advised to come to ED via homehealth team. BP 800/27mg upon arrival. PMH: DM, HTN, CAD s/p PCI, ICM, dCHF, CKD3, lymphedema. Pt admitted with cellulitis of Rt foot. Pt is s/p foot MRI 11/27, results include "No evidence of septic arthritis or osteomyelitis adjacent to the lateral plantar foot ulcer; Nonspecific T2 marrow changes and enhancement within the 1st metatarsal head, 1st proximal phalangeal head and 3rd metatarsal head without associated T1 signal abnormality,"  Clinical Impression  Pt admitted with above diagnosis. Pt currently with functional limitations due to the deficits listed below (see "PT Problem List"). Upon entry, pt in bed, awake and agreeable to participate. The pt is alert, pleasant, interactive, and able to provide info regarding prior level of function, both in tolerance and independence- husband garnished this report as the primary caregiver. Pt requires maxA to perform basic bed mobility required for ADL. BUE 5/5 at the elbows and able to pull self into long sitting with BUE. Pt assisted maxA of BLE for assisted ROM and resisted ROM. Pt has baseline weakness and mobility limitations now worse by progression of BLE lymphedema, likely will need not be able to resume baseline independence until leg edema reduces closer to baseline level. Additional DME can allow the patient's care needs to be met at home. Patient's performance this date reveals decreased ability, independence, and tolerance in performing all basic mobility required for performance of activities of daily living. Pt requires additional DME, close physical assistance, and cues for safe participate in mobility. Pt will benefit from skilled PT intervention to increase independence and safety with basic  mobility in preparation for discharge to the venue listed below.       Recommendations for follow up therapy are one component of a multi-disciplinary discharge planning process, led by the attending physician.  Recommendations may be updated based on patient status, additional functional criteria and insurance authorization.  Follow Up Recommendations Home health PT    Assistance Recommended at Discharge Intermittent Supervision/Assistance  Functional Status Assessment Patient has had a recent decline in their functional status and demonstrates the ability to make significant improvements in function in a reasonable and predictable amount of time.  Equipment Recommendations  Hospital bed;BSC/3in1;Other (comment) (mechanical lift; BSC needs to be wide and/or 500lb capacity)    Recommendations for Other Services       Precautions / Restrictions Precautions Precautions: Fall Restrictions Weight Bearing Restrictions: No Other Position/Activity Restrictions: Postop shoe required RLE      Mobility  Bed Mobility Overal bed mobility: Needs Assistance Bed Mobility: Rolling;Supine to Sit Rolling: Mod assist   Supine to sit: Mod assist (supine to long sitting with modA BUE hand held assist)     General bed mobility comments: did not attempt moving to EOB.    Transfers                        Ambulation/Gait                  Stairs            Wheelchair Mobility    Modified Rankin (Stroke Patients Only)       Balance Overall balance assessment: Modified Independent  Pertinent Vitals/Pain Pain Assessment: No/denies pain    Home Living Family/patient expects to be discharged to:: Private residence Living Arrangements: Spouse/significant other;Children;Other relatives (3  grandDTR (48yo, 33yo, 74yo), DTR adnDTR's boyfriend) Available Help at Discharge: Family Type of Home: Apartment Home  Access: Level entry       Home Layout: One level Home Equipment: Rollator (4 wheels);Wheelchair - Publishing copy (2 wheels) Additional Comments: WC typically for out of home mobility; WC has no cushion, but does have bilat elevated leg rests;    Prior Function Prior Level of Function : Needs assist       Physical Assist : ADLs (physical);Mobility (physical) Mobility (physical): Bed mobility;Transfers ADLs (physical): Bathing;Dressing;Toileting;Grooming Mobility Comments: Has not been taking lasix recently, and has not been able to utilize compression therapies (has pneumatic compression device at home due to urinary frequency incontinence) ADLs Comments: diabetic retinopathy with VI; uses toilet in BR which is not WC accessible; Husband provides set-up assist with toiletting as needed; sleeps in recliner liftchair at baseline cannot stand without lift.     Hand Dominance        Extremity/Trunk Assessment   Upper Extremity Assessment Upper Extremity Assessment: Generalized weakness    Lower Extremity Assessment Lower Extremity Assessment: Generalized weakness       Communication      Cognition Arousal/Alertness: Awake/alert Behavior During Therapy: WFL for tasks assessed/performed Overall Cognitive Status: Within Functional Limits for tasks assessed                                          General Comments      Exercises General Exercises - Lower Extremity Ankle Circles/Pumps: AROM;Both;15 reps;Supine Short Arc Quad: AAROM;Both;10 reps;Supine Heel Slides: AAROM;Both;15 reps;Supine Hip ABduction/ADduction: AAROM;Both;15 reps;Supine Hip Flexion/Marching: AAROM;Both;5 reps;Supine Mini-Sqauts: Strengthening;Left;10 reps;Supine;Limitations Mini Squats Limitations: manually resisted   Assessment/Plan    PT Assessment Patient needs continued PT services  PT Problem List Decreased strength;Decreased range of motion;Decreased activity  tolerance;Decreased balance;Decreased mobility;Decreased knowledge of use of DME;Decreased knowledge of precautions       PT Treatment Interventions DME instruction;Balance training;Gait training;Functional mobility training;Therapeutic activities;Therapeutic exercise;Patient/family education    PT Goals (Current goals can be found in the Care Plan section)  Acute Rehab PT Goals Patient Stated Goal: return to being able to live at home as per baseline PT Goal Formulation: With patient Time For Goal Achievement: 01/23/21 Potential to Achieve Goals: Good    Frequency Min 2X/week   Barriers to discharge        Co-evaluation               AM-PAC PT "6 Clicks" Mobility  Outcome Measure Help needed turning from your back to your side while in a flat bed without using bedrails?: Total Help needed moving from lying on your back to sitting on the side of a flat bed without using bedrails?: Total Help needed moving to and from a bed to a chair (including a wheelchair)?: Total Help needed standing up from a chair using your arms (e.g., wheelchair or bedside chair)?: Total Help needed to walk in hospital room?: Total Help needed climbing 3-5 steps with a railing? : Total 6 Click Score: 6    End of Session   Activity Tolerance: Patient tolerated treatment well;No increased pain Patient left: in bed;with nursing/sitter in room;with family/visitor present;with call bell/phone within reach Nurse Communication: Mobility status PT Visit Diagnosis:  Muscle weakness (generalized) (M62.81);Other abnormalities of gait and mobility (R26.89);Difficulty in walking, not elsewhere classified (R26.2)    Time: 3149-7026 PT Time Calculation (min) (ACUTE ONLY): 30 min   Charges:   PT Evaluation $PT Eval High Complexity: 1 High PT Treatments $Therapeutic Exercise: 8-22 mins       4:53 PM, 01/09/21 Etta Grandchild, PT, DPT Physical Therapist - [facility]   [phone] ([place])    [place] C 01/09/2021, 4:50 PM

## 2021-01-09 NOTE — Progress Notes (Signed)
Pharmacy Antibiotic Note  Laura Mccarty is a 65 y.o. female w/  PMH of diabetes, HTN, CAD with PCI, ischemic cardiomyopathy, chronic diastolic CHF CKD stage III, lymphedema admitted on 01/08/2021 with cellulitis.  Pharmacy has been consulted for Zosyn and vancomycin dosing. Since admission her renal function has declined slightly.  Plan:  1) continue Zosyn 3.375 gm IV Q8H EI   2) adjust vancomycin dose to 1500 mg IV every 24 hours  Goal AUC 400-550 Expected AUC: 530.2 SCr used: 1.42 mg/dL Ke: 0.037 h-1, T1/2: 20.2 h Daily renal function assessment while on IV vancomycin  Height: 5\' 5"  (165.1 cm) Weight: (!) 165 kg (363 lb 12.1 oz) IBW/kg (Calculated) : 57  Temp (24hrs), Avg:98.6 F (37 C), Min:97.8 F (36.6 C), Max:99.4 F (37.4 C)  Recent Labs  Lab 01/07/21 1916 01/08/21 2008 01/09/21 0740  WBC 6.3 5.2  --   CREATININE 1.10* 1.30* 1.42*  LATICACIDVEN 1.1  --   --      Estimated Creatinine Clearance: 63.3 mL/min (A) (by C-G formula based on SCr of 1.42 mg/dL (H)).    Allergies  Allergen Reactions   Codeine Nausea And Vomiting    Antimicrobials this admission: 11/27 Zosyn  >>  11/27 vancomycin  >>   Microbiology results: 11/27 BCx: NG x 1 day 11/27 SARS CoV-2: negative 11/27 influenza A/B: negative  Thank you for allowing pharmacy to be a part of this patient's care.  Dallie Piles 01/09/2021 10:22 AM

## 2021-01-10 DIAGNOSIS — L03115 Cellulitis of right lower limb: Secondary | ICD-10-CM | POA: Diagnosis not present

## 2021-01-10 LAB — BASIC METABOLIC PANEL
Anion gap: 4 — ABNORMAL LOW (ref 5–15)
BUN: 23 mg/dL (ref 8–23)
CO2: 27 mmol/L (ref 22–32)
Calcium: 8.4 mg/dL — ABNORMAL LOW (ref 8.9–10.3)
Chloride: 108 mmol/L (ref 98–111)
Creatinine, Ser: 1.47 mg/dL — ABNORMAL HIGH (ref 0.44–1.00)
GFR, Estimated: 40 mL/min — ABNORMAL LOW (ref >60)
Glucose, Bld: 150 mg/dL — ABNORMAL HIGH (ref 70–99)
Potassium: 4.3 mmol/L (ref 3.5–5.1)
Sodium: 139 mmol/L (ref 135–145)

## 2021-01-10 LAB — CBC
HCT: 26.6 % — ABNORMAL LOW (ref 36.0–46.0)
Hemoglobin: 8.1 g/dL — ABNORMAL LOW (ref 12.0–15.0)
MCH: 30.5 pg (ref 26.0–34.0)
MCHC: 30.5 g/dL (ref 30.0–36.0)
MCV: 100 fL (ref 80.0–100.0)
Platelets: 211 10*3/uL (ref 150–400)
RBC: 2.66 MIL/uL — ABNORMAL LOW (ref 3.87–5.11)
RDW: 16.9 % — ABNORMAL HIGH (ref 11.5–15.5)
WBC: 3.9 10*3/uL — ABNORMAL LOW (ref 4.0–10.5)
nRBC: 0 % (ref 0.0–0.2)

## 2021-01-10 LAB — GLUCOSE, CAPILLARY
Glucose-Capillary: 191 mg/dL — ABNORMAL HIGH (ref 70–99)
Glucose-Capillary: 150 mg/dL — ABNORMAL HIGH (ref 70–99)
Glucose-Capillary: 145 mg/dL — ABNORMAL HIGH (ref 70–99)

## 2021-01-10 MED ORDER — AMOXICILLIN-POT CLAVULANATE 875-125 MG PO TABS: 1.0000 | ORAL_TABLET | Freq: Two times a day (BID) | ORAL | 0 refills | Status: AC

## 2021-01-10 MED ORDER — ACETAMINOPHEN 325 MG PO TABS: 650.0000 mg | ORAL_TABLET | Freq: Four times a day (QID) | ORAL | 1 refills | Status: AC | PRN

## 2021-01-10 NOTE — Discharge Summary (Signed)
Physician Discharge Summary  Patient ID: Laura Mccarty MRN: [id number] DOB/AGE: [birth year] 65 y.o.  Admit date: 01/08/2021 Discharge date: 01/10/2021  Admission Diagnoses: Right lower extremity cellulitis Chronic bilateral lower extremity lymphedema  Discharge Diagnoses:  Principal Problem:   Cellulitis of right foot Active Problems:   Coronary artery disease involving native coronary artery of native heart without angina pectoris   Morbid obesity with BMI of 50.0-59.9, adult (HCC)   Type 2 diabetes mellitus with diabetic neuropathy (HCC)   Lymphedema   Stage 3a chronic kidney disease ([place])   Discharged Condition: stable  Hospital course:  Laura Mccarty, 65 y.o. female with PMH of diabetes, HTN, CAD with PCI, ischemic cardiomyopathy, chronic diastolic CHF CKD stage III, lymphedema followed by podiatry presents to the ED with a right foot infection after removal of her Unna boot last seen by her podiatrist 3 weeks PTA when she had some calluses that shaved off but noticed she was having increasing foot pain. In the ED, was noted to be hypertensive, normal lactic acid and WBC count EKG sinus rhythm, chest x-ray no active disease right foot x-ray shows no evidence of acute osteomyelitis MRI ordered and empirically placed on antibiotics.  Podiatry consulted and admitted patient for further management.  Patient was managed by Dr. Maren Beach.  Cellulitis of the right foot in the setting of diabetes and chronic lymphedema: Podiatry has done bedside debridement 11/27 and recommended wound care.wound care note appreciated.  Unna boot applied to right lower extremity.  Plan is to apply Unna boot and Coban as requested.  Patient will also have home health follow-up at home with dressing changes as appropriate.  Patient will complete clinical course with antibiotics.  Cultures thus far has been negative.  MRI reviewed shows focal soft tissue ulcer on the plantar to the fifth MTP joint without focal fluid  collection extensive subcutaneous edema and ill-defined fluid throughout the foot consistent with soft tissue infection and cellulitis no evidence of septic arthritis or osteomyelitis.    CAD PCI to LAD : Patient is asymptomatic at this time.  Patient will continue with cardioprotective medication that include aspirin and statins.  Essential hypertension: Blood pressures well controlled.  Continue with home regimen.  Ischemic cardiomyopathy-BNP 258 slightly high Resume home aspirin.  Lipitor Coreg Plavix and torsemide Has chronic lymphedema respiratory status stable.   T2DM: Blood sugar well controlled  Recent Labs  No results found for: HGBA1C    Last Labs         Recent Labs  Lab 01/08/21 0813 01/08/21 1921 01/08/21 2139 01/09/21 0740  GLUCAP 169* 172* 166* 128*      Anemia of chronic disease hemoglobin 9.8 on admission.8.8 monitor   CKD stage IIIa: Monitor creatinine slightly uptrending.  Consults:  Podiatry  Significant Diagnostic Studies: radiology: MRI: Lower extremity  IMPRESSION: 1. Focal soft tissue ulcer plantar to the 5th MTP joint without focal fluid collection. Extensive subcutaneous edema and ill-defined fluid throughout the foot consistent with soft tissue infection (cellulitis). 2. No evidence of septic arthritis or osteomyelitis adjacent to the lateral plantar foot ulcer. 3. Nonspecific T2 marrow changes and enhancement within the 1st metatarsal head, 1st proximal phalangeal head and 3rd metatarsal head without associated T1 signal abnormality, cortical destruction or significant joint effusion.   Treatments: antibiotics: vancomycin and ceftriaxone  Discharge Exam: Blood pressure (!) 146/55, pulse (!) 57, temperature 98.7 F (37.1 C), temperature source Oral, resp. rate 18, height 5\' 5"  (1.651 m), weight (!) 165 kg, SpO2 92 %. General  appearance: alert, cooperative, and morbidly obese Back: symmetric, no curvature. ROM normal. No CVA  tenderness. Extremities: Bilateral lower extremity edema secondary to lymphedema with chronic skin changes.  Right foot with dressing in place.  Dressing looks clean and dry at this time. Skin: Skin color, texture, turgor normal. No rashes or lesions or abnormal findings noted on skin with dried scab skin.  Bilateral lymphedema.  Gross looking but improved.  Unna boot applied to right lower extremity.  Disposition: Discharge disposition: 06-Home-Health Care Svc       Discharge Instructions     Call MD for:  redness, tenderness, or signs of infection (pain, swelling, redness, odor or green/yellow discharge around incision site)   Complete by: As directed    Call MD for:  temperature >100.4   Complete by: As directed    Diet - low sodium heart healthy   Complete by: As directed    Discharge wound care:   Complete by: As directed    Daily wound dressing   Increase activity slowly   Complete by: As directed       Allergies as of 01/10/2021       Reactions   Codeine Nausea And Vomiting        Medication List     TAKE these medications    acetaminophen 325 MG tablet Commonly known as: TYLENOL Take 2 tablets (650 mg total) by mouth every 6 (six) hours as needed for mild pain (or Fever >/= 101).   amoxicillin-clavulanate 875-125 MG tablet Commonly known as: Augmentin Take 1 tablet by mouth 2 (two) times daily for 10 days.   aspirin EC 81 MG tablet Take 81 mg by mouth daily.   atorvastatin 80 MG tablet Commonly known as: LIPITOR Take 80 mg by mouth every evening.   carvedilol 6.25 MG tablet Commonly known as: COREG Take 6.25 mg by mouth 2 (two) times daily.   clopidogrel 75 MG tablet Commonly known as: PLAVIX Take 75 mg by mouth daily.   Dulaglutide 0.75 MG/0.5ML Sopn Inject 0.75 mg into the skin every Sunday.   gabapentin 100 MG capsule Commonly known as: NEURONTIN Take 100-300 mg by mouth at bedtime.   glipiZIDE 10 MG 24 hr tablet Commonly known as:  GLUCOTROL XL Take 10 mg by mouth daily.   torsemide 20 MG tablet Commonly known as: DEMADEX Take 40 mg by mouth daily.               Durable Medical Equipment  (From admission, onward)           Start     Ordered   01/10/21 1230  For home use only DME 3 n 1  Once       Comments: Bariatric   01/10/21 1230   01/10/21 1230  For home use only DME Hospital bed  Once       Comments: Bariatric  Question Answer Comment  Length of Need Lifetime   Patient has (list medical condition): Lymphedema   The above medical condition requires: Patient requires the ability to reposition frequently   Head must be elevated greater than: 30 degrees   Bed type Semi-electric   Hoyer Lift Yes   Support Surface: Gel Overlay      11 /29/22 1230   01/10/21 1229  For home use only DME Walker rolling  Once       Comments: Bariatric  Question Answer Comment  Walker: With So-Hi   Patient needs a walker to treat with  the following condition Weakness      01/10/21 1230   01/08/21 1119  For home use only DME Other see comment  Once       Comments: Post-op surgical shoe  Question:  Length of Need  Answer:  6 Months   01/08/21 1118              Discharge Care Instructions  (From admission, onward)           Start     Ordered   01/10/21 0000  Discharge wound care:       Comments: Daily wound dressing   01/10/21 1232             Signed: Warnell Bureau Rama Mcclintock 01/10/2021, 12:32 PM

## 2021-01-10 NOTE — Consult Note (Addendum)
[facility] Nurse Consult Note: Reason for Consult:Consult requested to apply Una boot to RLE.  Pt has been followed by Podiatry team who performed sharp debridement on 11/27.  Pt has a full thickness wound to right plantar foot; red and dry, no odor, drainage, fluctuance or bleeding.  3X5X.2cm.  Right lower leg with generalized lymphademia and chronic dry crusted skin changes. Armed forces logistics/support/administrative officer as requested, then applied ace wrap over the top since Pt states the coban "always sticks to her clothes." Dressing procedure/placement/frequency: Topical treatment orders provided for bedside nurses to perform as follows: Bedside nurses; leave right [place] boot in place, [place] team will change Q Tues while Pt is in the hospital. Pt will need home health to change the [facility] boot weekly after discharge; please order.  Julien Girt MSN, RN, [place], [place], [place]

## 2021-01-10 NOTE — TOC Transition Note (Signed)
Transition of Care [facility]) - CM/SW Discharge Note   Patient Details  Name: Laura Mccarty MRN: [id number] Date of Birth: 1955/09/07  Transition of Care [facility]) CM/SW Contact:  Eileen Stanford, LCSW Phone Number: 01/10/2021, 2:08 PM   Clinical Narrative:   CSW spoke with Suanne Marker with [employer] and the DME will be delivered to the home tomorrow. Pt and pt's spouse updated and pt wants to dc today. MD has dc pt. Pt is aware DME will be delivered tomorrow. Pt needs wheelchair transport home. Pt's spouse has brought wheelchair from home. [facility] transport arranged and will be here around 3:30. Unit number was given to transport to call when they arrive. MD, RN, Pt, and Pt's spouse all aware.    Final next level of care: Home w Home Health Services Barriers to Discharge: No Barriers Identified   Patient Goals and CMS Choice        Discharge Placement                Patient to be transferred to facility by: [facility] transport, wheelchair transport Name of family member notified: spouse Patient and family notified of of transfer: 01/10/21  Discharge Plan and Services                                     Social Determinants of Health ([place]) Interventions     Readmission Risk Interventions No flowsheet data found.

## 2021-01-10 NOTE — TOC Progression Note (Signed)
Transition of Care [facility]) - Progression Note    Patient Details  Name: Laura Mccarty MRN: [id number] Date of Birth: [birth year]  Transition of Care [facility]) CM/SW Contact  Eileen Stanford, LCSW Phone Number: 01/10/2021, 11:32 AM  Clinical Narrative:   CSW spoke with pt and pt is aware that the purwick is not something covered by insurance. Pt states her insurance told her a generic purick would be covered. Pt states she is going to work with her PCP to get that. Pt is aware that CSW will order the bsc, lift, and hospital bed through [employer]. Pt also states she is active with [facility]. Pt does not want PT and OT at this time as she states she has had them before and it didn't make a difference.         Expected Discharge Plan and Services                                                 Social Determinants of Health (SDOH) Interventions    Readmission Risk Interventions No flowsheet data found.

## 2021-01-13 LAB — CULTURE, BLOOD (ROUTINE X 2)
Culture: NO GROWTH
Culture: NO GROWTH
Special Requests: ADEQUATE

## 2021-01-16 ENCOUNTER — Telehealth: Payer: Self-pay | Admitting: *Deleted

## 2021-01-16 NOTE — Telephone Encounter (Signed)
That's fine, they can skip tomorrow and I will change Wednesday. They will need to re-start changes next week

## 2021-01-16 NOTE — Telephone Encounter (Signed)
"  I have an appointment on Wednesday to see Dr. Sherryle Lis.  I was hospitalized last week because of an infection in the foot.  I need to speak to you because nobody sent some wound care orders over for my home health.  They're supposed to come out tomorrow to change the unna boot.  I don't see any sense in changing one if I'm coming in to see you on Wednesday.  Please give me a call back.  I need some direction."

## 2021-01-16 NOTE — Telephone Encounter (Signed)
I'm calling to let you know that Dr. Sherryle Lis said you can skip getting the change in dressing for tomorrow.  He'll redress it on Wednesday.  "Okay, the nurse had to skip re-dressing it yesterday because she they did not give her any orders when I was released from the hospital."  He'll redress you on Wednesday and will give you the orders then.

## 2021-01-18 ENCOUNTER — Ambulatory Visit: Payer: Medicare Other | Admitting: Podiatry

## 2021-01-18 ENCOUNTER — Telehealth: Payer: Self-pay | Admitting: Podiatry

## 2021-01-18 DIAGNOSIS — E08621 Diabetes mellitus due to underlying condition with foot ulcer: Secondary | ICD-10-CM

## 2021-01-18 DIAGNOSIS — I890 Lymphedema, not elsewhere classified: Secondary | ICD-10-CM

## 2021-01-18 NOTE — Telephone Encounter (Signed)
Called patient and let her know I faxed over orders to [facility]. Fax number in pt's chart.

## 2021-01-18 NOTE — Telephone Encounter (Signed)
Patient called and cancelled her appt for today due to no transportation. Patient states she needs wound orders sent to [facility] ASAP for the RN. Patients number is [phone]

## 2021-03-07 ENCOUNTER — Ambulatory Visit: Payer: Medicare HMO | Admitting: Podiatry

## 2021-03-07 ENCOUNTER — Other Ambulatory Visit: Payer: Self-pay

## 2021-03-07 ENCOUNTER — Other Ambulatory Visit: Payer: Self-pay | Admitting: Podiatry

## 2021-03-07 DIAGNOSIS — L97512 Non-pressure chronic ulcer of other part of right foot with fat layer exposed: Secondary | ICD-10-CM

## 2021-03-07 DIAGNOSIS — E08621 Diabetes mellitus due to underlying condition with foot ulcer: Secondary | ICD-10-CM | POA: Diagnosis not present

## 2021-03-07 DIAGNOSIS — I890 Lymphedema, not elsewhere classified: Secondary | ICD-10-CM | POA: Diagnosis not present

## 2021-03-07 MED ORDER — DOXYCYCLINE HYCLATE 100 MG PO TABS: 100.0000 mg | ORAL_TABLET | Freq: Two times a day (BID) | ORAL | 0 refills | Status: DC

## 2021-03-07 NOTE — Progress Notes (Signed)
° °  HPI: 66 y.o. female presenting today PMHx CHF, CKD stage III, diabetes mellitus, HTN, chronic severe lymphedema presenting for ulcers that have developed to the patient's bilateral lower extremities and feet.  Patient states that the lymphedema has been present for about 2-3 years now.  She has spent some time in the hospital for infection and cellulitis.  She has seen different vascular surgeons who recommended compression and lymphedema pumps that she has at home.  Presenting for treatment and evaluation with her husband  Past Medical History:  Diagnosis Date   CHF (congestive heart failure) ([place])    Diabetes mellitus without complication ([place])    Hypertension    Kidney disease, chronic, stage III (GFR 30-59 ml/min) (HCC)    Lymphedema     No past surgical history on file.  Allergies  Allergen Reactions   Codeine Nausea And Vomiting            Physical Exam: General: The patient is alert and oriented x3 in no acute distress.  Dermatology: Hyperkeratosis of skin with maceration and skin breakdown due to the chronic lymphedema and drainage specifically to the lateral aspects of the ankle and dorsum of the foot.  Please see above noted photo  There is also a wound to the plantar aspect of the fifth MTP joint about 1.5 cm in diameter.  This appears stable.  Granular wound base.  There is no exposed bone muscle tendon ligament or joint.  There is also small ulcers to the distal tips of the toenails with maceration underneath the nail plates.  Hyperkeratotic elongated dystrophic nails also noted 1-5 bilateral  Vascular: Chronic severe bilateral lower extremity lymphedema  Neurological: Light touch and protective threshold diminished  Musculoskeletal Exam: Patient is ambulatory  Assessment: 1.  Chronic severe bilateral lower extremity lymphedema with ulceration of the skin 2.  Ulcer plantar aspect of the fifth MTP right foot 3.  Painful symptomatic elongated toenails 1-5  bilateral  Plan of Care:  1. Patient evaluated.  2.  Mechanical debridement of nails 1-5 bilateral was performed using a nail nipper without incident or bleeding.  The patient was grateful for this and felt significant relief 3.  Continue home health nurse compression dressing changes 2x/week.  Currently they are applying Unna boot multilayer soft cast compression wraps.  Continue. 4.  Continue lymphedema pumps as per recommended by vascular 5.  Cultures taken and sent to pathology of the superficial skin breakdown to the dorsum of the right foot  6.  Prescription for doxycycline 100 mg 2 times daily #20  7.  Return to clinic in 3 weeks      Edrick Kins, DPM [facility]  Dr. Edrick Kins, DPM    [address], [place] [address]                Office [phone]  Fax [phone]

## 2021-03-07 NOTE — Addendum Note (Signed)
Addended by: Graceann Congress D on: 03/07/2021 04:57 PM   Modules accepted: Orders

## 2021-03-13 ENCOUNTER — Other Ambulatory Visit: Payer: Self-pay | Admitting: Podiatry

## 2021-03-13 ENCOUNTER — Telehealth: Payer: Self-pay

## 2021-03-13 ENCOUNTER — Telehealth: Payer: Self-pay | Admitting: *Deleted

## 2021-03-13 DIAGNOSIS — E08621 Diabetes mellitus due to underlying condition with foot ulcer: Secondary | ICD-10-CM

## 2021-03-13 DIAGNOSIS — L97512 Non-pressure chronic ulcer of other part of right foot with fat layer exposed: Secondary | ICD-10-CM

## 2021-03-13 DIAGNOSIS — I890 Lymphedema, not elsewhere classified: Secondary | ICD-10-CM

## 2021-03-13 MED ORDER — SULFAMETHOXAZOLE-TRIMETHOPRIM 800-160 MG PO TABS: 1.0000 | ORAL_TABLET | Freq: Two times a day (BID) | ORAL | 0 refills | Status: AC

## 2021-03-13 NOTE — Progress Notes (Signed)
As per culture results. DC doxycycline. Rx Bactrim DS BID #20 x 10 days

## 2021-03-13 NOTE — Telephone Encounter (Addendum)
Called patient and left vm to call back to discuss culture results.  Per Dr. Amalia Hailey, new script for [facility] DS has been sent to [facility]

## 2021-03-13 NOTE — Telephone Encounter (Signed)
-----   Message from Edrick Kins, DPM sent at 03/13/2021  7:57 AM EST ----- Regarding: Please notify patient Culture results came back. DC doxycycline. Rx Bactrim DS BID #20 x 10 days.   Please inform patient. - Dr. Amalia Hailey

## 2021-03-13 NOTE — Telephone Encounter (Signed)
I am calling to let your wife know that Dr. Amalia Hailey got her culture results back.  He wants her to stop the Doxycycline and start taking the Bactrim DS.  "Why, is this one more effective for the type of bacteria that is there?"  Yes, the Bactrim is more effective.  "Have they put in the order for [facility]?  They said they have not received anything.  They said they can not touch her wound without the orders because they need instruction on how to dress the wound, how often, and for how long.  I have been trying to do my best to change the dressings with my limited skills.  I had found a piece of Silver that we used on some other wounds previously.  I put it on the wound and it dried it up.  I don't know if he may be interested in applying this for her wound.  I just wanted to throw it out there."  I will let Dr. Amalia Hailey and his nurse know.

## 2021-03-14 LAB — WOUND CULTURE

## 2021-03-14 NOTE — Addendum Note (Signed)
Addended by: Lolita Rieger on: 03/14/2021 06:26 PM   Modules accepted: Orders

## 2021-03-14 NOTE — Telephone Encounter (Signed)
I left Mr. Alviar a message that I will call [facility] Lemmings on tomorrow and put in the order for the [place].

## 2021-03-15 NOTE — Telephone Encounter (Addendum)
I called [facility] to see if they have a referral form.  She informed me that there isn't a form.  She said to fax the orders, demographics, and the last chart note to them at [phone].   I faxed the order to [facility].  I called and informed Laura Mccarty that they should hear something from [place] soon.  The referral was sent.  He stated that would be good because the nurse is supposed to come back out there on Friday.

## 2021-03-24 ENCOUNTER — Telehealth: Payer: Self-pay

## 2021-03-24 NOTE — Telephone Encounter (Signed)
Patient called and stated that for the past week, she is having severe heel and bottom of feet pain, "feels like somebody sticking needles or knives in the bottom of my foot"  She denied any redness or swelling and she said she does not have any more drainage and odor than usual from her foot wounds.  She also stated that her [facility] nurse said it was looking better.   Please advise

## 2021-03-27 NOTE — Telephone Encounter (Signed)
Patient notified of Dr. Amalia Hailey recommendations and instructed to call [place] office if pain becomes worse to see if she can be seen sooner.  She verbalized understanding

## 2021-03-29 ENCOUNTER — Ambulatory Visit: Payer: Medicare HMO | Admitting: Podiatry

## 2021-04-05 ENCOUNTER — Other Ambulatory Visit: Payer: Self-pay

## 2021-04-05 ENCOUNTER — Ambulatory Visit: Payer: Medicare HMO | Admitting: Podiatry

## 2021-04-05 DIAGNOSIS — M79674 Pain in right toe(s): Secondary | ICD-10-CM

## 2021-04-05 DIAGNOSIS — B351 Tinea unguium: Secondary | ICD-10-CM

## 2021-04-05 DIAGNOSIS — E08621 Diabetes mellitus due to underlying condition with foot ulcer: Secondary | ICD-10-CM | POA: Diagnosis not present

## 2021-04-05 DIAGNOSIS — M79675 Pain in left toe(s): Secondary | ICD-10-CM

## 2021-04-05 DIAGNOSIS — L97512 Non-pressure chronic ulcer of other part of right foot with fat layer exposed: Secondary | ICD-10-CM

## 2021-04-05 DIAGNOSIS — I890 Lymphedema, not elsewhere classified: Secondary | ICD-10-CM | POA: Diagnosis not present

## 2021-04-05 NOTE — Progress Notes (Signed)
°  Subjective:  Patient ID: Laura Mccarty, female    DOB: 17-Dec-1955,  MRN: [id number]  Chief Complaint  Patient presents with   Diabetic Ulcer    Right foot wound care   lymphedema    66 y.o. female presents with the above complaint. History confirmed with patient. Here with her husband.  She has significant issues with lymphedema that is chronic.  [facility] home nursing has been coming to apply compression dressings, since last visit had an infection that she saw Dr. Amalia Hailey for was treated with Bactrim and this is improved somewhat.  Has not been able to use her lymphedema pumps because of the pain she is experiencing with  Objective:  Physical Exam: warm, good capillary refill.  Unable to palpate pulses due to significant lymphedema throughout both lower extremities which is severe.  Pitting.  Weeping skin on legs.  Plantar right foot ulcer measuring 2.4x1.8 x 0.4 cm full-thickness with exposed subcutaneous tissue, no exposed bone tendon or joint.  There is serous drainage, no cellulitis malodor or purulence.  Left foot preulcerative callus about 5 in the same location       Assessment:   1. Lymphedema of both lower extremities   2. 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  Ulcer right foot -We discussed the etiology and factors that are a part of the wound healing process.  We also discussed the risk of infection both soft tissue and osteomyelitis from open ulceration.  Discussed the risk of limb loss if this happens or worsens. -Her A1c is 6.7% -Debridement as below. -Dressed with Iodosorb, DSD. -Continue [facility] home nursing twice weekly visits with Iodosorb, Hydrofera Blue, compression bilateral -At this point her wounds continue to wax and wane with her lymphedema.  The lymphedema gets worse, this makes the wound  deteriorate and then she is unable to use the pumps feels the pain from the wound and vice versa.  I am referring her to the wound care center for further wound care and management of her lymphedema and hopefully they can get into a bone management physician that I am not able to provide here in my outpatient office.   Here to see her for diabetic foot examinations and routine diabetic foot care.    Procedure: Excisional Debridement of Wound Rationale: Removal of non-viable soft tissue from the wound to promote healing.  Anesthesia: none Post-Debridement Wound Measurements: 2.4 x 1.8 x 0.4 cm Type of Debridement: Sharp Excisional Tissue Removed: Non-viable soft tissue Depth of Debridement: subcutaneous tissue. Technique: Sharp excisional debridement to bleeding, viable wound base.  Dressing: Dry, sterile, compression dressing. Disposition: Patient tolerated procedure well.    Return in about 3 months (around 07/03/2021) for at risk diabetic foot care.

## 2021-04-18 ENCOUNTER — Other Ambulatory Visit: Payer: Self-pay

## 2021-04-18 ENCOUNTER — Encounter: Payer: Medicare HMO | Attending: Physician Assistant | Admitting: Physician Assistant

## 2021-04-18 DIAGNOSIS — E1122 Type 2 diabetes mellitus with diabetic chronic kidney disease: Secondary | ICD-10-CM | POA: Diagnosis not present

## 2021-04-18 DIAGNOSIS — I5042 Chronic combined systolic (congestive) and diastolic (congestive) heart failure: Secondary | ICD-10-CM | POA: Insufficient documentation

## 2021-04-18 DIAGNOSIS — I890 Lymphedema, not elsewhere classified: Secondary | ICD-10-CM | POA: Diagnosis not present

## 2021-04-18 DIAGNOSIS — I130 Hypertensive heart and chronic kidney disease with heart failure and stage 1 through stage 4 chronic kidney disease, or unspecified chronic kidney disease: Secondary | ICD-10-CM | POA: Diagnosis not present

## 2021-04-18 DIAGNOSIS — E11621 Type 2 diabetes mellitus with foot ulcer: Secondary | ICD-10-CM | POA: Diagnosis present

## 2021-04-18 DIAGNOSIS — L97522 Non-pressure chronic ulcer of other part of left foot with fat layer exposed: Secondary | ICD-10-CM | POA: Diagnosis not present

## 2021-04-18 DIAGNOSIS — L97122 Non-pressure chronic ulcer of left thigh with fat layer exposed: Secondary | ICD-10-CM | POA: Insufficient documentation

## 2021-04-18 DIAGNOSIS — L97512 Non-pressure chronic ulcer of other part of right foot with fat layer exposed: Secondary | ICD-10-CM | POA: Insufficient documentation

## 2021-04-18 DIAGNOSIS — N183 Chronic kidney disease, stage 3 unspecified: Secondary | ICD-10-CM | POA: Insufficient documentation

## 2021-04-18 NOTE — Progress Notes (Signed)
BIRGIT, NOWLING ([id number]) ?Visit Report for 04/18/2021 ?Abuse Risk Screen Details ?Patient Name: Laura Mccarty, Laura Mccarty ?Date of Service: 04/18/2021 12:45 PM ?Medical Record Number: [id number] ?Patient Account Number: [id number] ?Date of Birth/Sex: [birth year] (66 y.o. F) ?Treating RN: Levora Dredge ?Primary Care Liban Guedes: Theodora Blow Other Clinician: ?Referring Yocelin Vanlue: Lanae Crumbly ?Treating Betsy Rosello/Extender: Jeri Cos ?Weeks in Treatment: 0 ?Abuse Risk Screen Items ?Answer ?ABUSE RISK SCREEN: ?Has anyone close to you tried to hurt or harm you recentlyo No ?Do you feel uncomfortable with anyone in your familyo No ?Has anyone forced you do things that you didnot want to doo No ?Electronic Signature(s) ?Signed: 04/18/2021 5:07:45 PM By: Levora Dredge ?Entered By: Levora Dredge on 04/18/2021 13:09:19 ?Laura Mccarty, Laura Mccarty ([id number]) ?-------------------------------------------------------------------------------- ?Activities of Daily Living Details ?Patient Name: Laura Mccarty, Laura Mccarty ?Date of Service: 04/18/2021 12:45 PM ?Medical Record Number: [id number] ?Patient Account Number: [id number] ?Date of Birth/Sex: Nov 14, 1955 (66 y.o. F) ?Treating RN: Levora Dredge ?Primary Care Breannah Kratt: Theodora Blow Other Clinician: ?Referring Delores Thelen: Lanae Crumbly ?Treating Jakota Manthei/Extender: Jeri Cos ?Weeks in Treatment: 0 ?Activities of Daily Living Items ?Answer ?Activities of Daily Living (Please select one for each item) ?Drive Automobile Not Able ?Take Medications Completely Able ?Use Telephone Completely Able ?Care for Appearance Need Assistance ?Use Toilet Need Assistance ?Bath / Shower Need Assistance ?Dress Self Need Assistance ?Feed Self Completely Able ?Walk Need Assistance ?Get In / Out Bed Need Assistance ?Housework Need Assistance ?Prepare Meals Need Assistance ?Handle Money Need Assistance ?Shop for Self Need Assistance ?Electronic Signature(s) ?Signed: 04/18/2021 5:07:45 PM By: Levora Dredge ?Entered By:  Levora Dredge on 04/18/2021 13:09:56 ?Laura Mccarty, Laura Mccarty ([id number]) ?-------------------------------------------------------------------------------- ?Education Screening Details ?Patient Name: Laura Mccarty, Laura Mccarty ?Date of Service: 04/18/2021 12:45 PM ?Medical Record Number: [id number] ?Patient Account Number: [id number] ?Date of Birth/Sex: 12-Feb-1956 (66 y.o. F) ?Treating RN: Levora Dredge ?Primary Care Marjory Meints: Theodora Blow Other Clinician: ?Referring Manroop Jakubowicz: Lanae Crumbly ?Treating Deshaun Weisinger/Extender: Jeri Cos ?Weeks in Treatment: 0 ?Learning Preferences/Education Level/Primary Language ?Learning Preference: Explanation, Demonstration, Video, Communication Board, Printed Material ?Highest Education Level: High School ?Preferred Language: English ?Cognitive Barrier ?Language Barrier: No ?Translator Needed: No ?Memory Deficit: No ?Emotional Barrier: No ?Cultural/Religious Beliefs Affecting Medical Care: No ?Physical Barrier ?Impaired Vision: Yes partial blindness ?Impaired Hearing: No ?Decreased Hand dexterity: No ?Knowledge/Comprehension ?Knowledge Level: High ?Comprehension Level: High ?Ability to understand written instructions: High ?Ability to understand verbal instructions: High ?Motivation ?Anxiety Level: Calm ?Cooperation: Cooperative ?Education Importance: Acknowledges Need ?Interest in Health Problems: Asks Questions ?Perception: Coherent ?Willingness to Engage in Self-Management ?High ?Activities: ?Readiness to Engage in Self-Management ?High ?Activities: ?Electronic Signature(s) ?Signed: 04/18/2021 5:07:45 PM By: Levora Dredge ?Entered By: Levora Dredge on 04/18/2021 13:10:54 ?Laura Mccarty, Laura Mccarty ([id number]) ?-------------------------------------------------------------------------------- ?Fall Risk Assessment Details ?Patient Name: Laura Mccarty, Laura Mccarty ?Date of Service: 04/18/2021 12:45 PM ?Medical Record Number: [id number] ?Patient Account Number: [id number] ?Date of Birth/Sex: 1955/09/18 (66 y.o.  F) ?Treating RN: Levora Dredge ?Primary Care Alianah Lofton: Theodora Blow Other Clinician: ?Referring Noelle Sease: Lanae Crumbly ?Treating Braley Luckenbaugh/Extender: Jeri Cos ?Weeks in Treatment: 0 ?Fall Risk Assessment Items ?Have you had 2 or more falls in the last 12 monthso 0 No ?Have you had any fall that resulted in injury in the last 12 monthso 0 No ?FALLS RISK SCREEN ?History of falling - immediate or within 3 months 0 No ?Secondary diagnosis (Do you have 2 or more medical diagnoseso) 0 No ?Ambulatory aid ?None/bed rest/wheelchair/nurse 0 Yes ?Crutches/cane/walker 0 No ?Furniture 0 No ?Intravenous therapy Access/Saline/Heparin Lock 0 No ?Gait/Transferring ?Normal/ bed rest/ wheelchair 0 Yes ?Weak (short steps with or without shuffle, stooped  but able to lift head while walking, may ?0 No ?seek support from furniture) ?Impaired (short steps with shuffle, may have difficulty arising from chair, head down, impaired ?0 No ?balance) ?Mental Status ?Oriented to own ability 0 Yes ?Electronic Signature(s) ?Signed: 04/18/2021 5:07:45 PM By: Levora Dredge ?Entered By: Levora Dredge on 04/18/2021 13:11:17 ?Laura Mccarty, Laura Mccarty ([id number]) ?-------------------------------------------------------------------------------- ?Foot Assessment Details ?Patient Name: Laura Mccarty, Laura Mccarty ?Date of Service: 04/18/2021 12:45 PM ?Medical Record Number: [id number] ?Patient Account Number: [id number] ?Date of Birth/Sex: 1955/07/31 (66 y.o. F) ?Treating RN: Levora Dredge ?Primary Care Myasia Sinatra: Theodora Blow Other Clinician: ?Referring Jaquana Geiger: Lanae Crumbly ?Treating Deirdre Gryder/Extender: Jeri Cos ?Weeks in Treatment: 0 ?Foot Assessment Items ?Site Locations ?+ = Sensation present, - = Sensation absent, C = Callus, U = Ulcer ?R = Redness, W = Warmth, M = Maceration, PU = Pre-ulcerative lesion ?F = Fissure, S = Swelling, D = Dryness ?Assessment ?Right: Left: ?Other Deformity: No No ?Prior Foot Ulcer: Yes No ?Prior Amputation: No  No ?Charcot Joint: No No ?Ambulatory Status: Ambulatory With Help ?Assistance Device: Wheelchair ?Gait: Steady ?Electronic Signature(s) ?Signed: 04/18/2021 5:07:45 PM By: Levora Dredge ?Entered By: Levora Dredge on 04/18/2021 13:31:21 ?Laura Mccarty, Laura Mccarty ([id number]) ?-------------------------------------------------------------------------------- ?Nutrition Risk Screening Details ?Patient Name: Laura Mccarty, Laura Mccarty ?Date of Service: 04/18/2021 12:45 PM ?Medical Record Number: [id number] ?Patient Account Number: [id number] ?Date of Birth/Sex: August 25, 1955 (66 y.o. F) ?Treating RN: Levora Dredge ?Primary Care Issabella Rix: Theodora Blow Other Clinician: ?Referring Zamzam Whinery: Lanae Crumbly ?Treating Saiya Crist/Extender: Jeri Cos ?Weeks in Treatment: 0 ?Height (in): 64 ?Weight (lbs): 360 ?Body Mass Index (BMI): 61.8 ?Nutrition Risk Screening Items ?Score Screening ?NUTRITION RISK SCREEN: ?I have an illness or condition that made me change the kind and/or amount of food I eat 0 No ?I eat fewer than two meals per day 0 No ?I eat few fruits and vegetables, or milk products 0 No ?I have three or more drinks of beer, liquor or wine almost every day 0 No ?I have tooth or mouth problems that make it hard for me to eat 0 No ?I don't always have enough money to buy the food I need 0 No ?I eat alone most of the time 0 No ?I take three or more different prescribed or over-the-counter drugs a day 0 No ?Without wanting to, I have lost or gained 10 pounds in the last six months 0 No ?I am not always physically able to shop, cook and/or feed myself 0 No ?Nutrition Protocols ?Good Risk Protocol 0 No interventions needed ?Moderate Risk Protocol ?High Risk Proctocol ?Risk Level: Good Risk ?Score: 0 ?Electronic Signature(s) ?Signed: 04/18/2021 5:07:45 PM By: Levora Dredge ?Entered By: Levora Dredge on 04/18/2021 13:11:27 ?

## 2021-04-18 NOTE — Progress Notes (Addendum)
TANNAH, DREYFUSS ([id number]) Visit Report for 04/18/2021 Allergy List Details Patient Name: DANAJA, LASOTA Date of Service: 04/18/2021 12:45 PM Medical Record Number: [id number] Patient Account Number: [id number] Date of Birth/Sex: 01/16/1956 (66 y.o. F) Treating RN: Levora Dredge Primary Care Merridith Dershem: Theodora Blow Other Clinician: Referring Nyoka Alcoser: Lanae Crumbly Treating Jaideep Pollack/Extender: Jeri Cos Weeks in Treatment: 0 Allergies Active Allergies codeine Reaction: nausea and vomiting Severity: Moderate Allergy Notes Electronic Signature(s) Signed: 04/18/2021 5:07:45 PM By: Levora Dredge Entered By: Levora Dredge on 04/18/2021 13:03:19 [name] ([id number]) -------------------------------------------------------------------------------- Arrival Information Details Patient Name: [name] Date of Service: 04/18/2021 12:45 PM Medical Record Number: [id number] Patient Account Number: [id number] Date of Birth/Sex: 1955-04-18 (66 y.o. 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 F) Treating RN: Levora Dredge Primary Care Shilynn Hoch: Theodora Blow Other Clinician: Referring Bernell Haynie: Lanae Crumbly Treating Halima Fogal/Extender: Skipper Cliche in Treatment: 0 Clinic Level of Care Assessment Items TOOL 2 Quantity Score '[]'$  - Use when only an EandM is performed on the INITIAL visit 0 ASSESSMENTS - Nursing Assessment / Reassessment '[]'$  - General Physical Exam (combine w/ comprehensive assessment (listed just below) when performed on new 0 pt. evals) X- 1 25 Comprehensive Assessment (HX, ROS, Risk Assessments, Wounds Hx, etc.) ASSESSMENTS - Wound and Skin Assessment / Reassessment '[]'$  - Simple Wound Assessment / Reassessment - one wound 0 X- 3 5 Complex Wound Assessment / Reassessment - multiple wounds '[]'$  - 0 Dermatologic / Skin Assessment (not related to wound area) ASSESSMENTS - Ostomy and/or Continence Assessment and Care '[]'$  - Incontinence Assessment and Management 0 '[]'$  - 0 Ostomy Care Assessment and Management (repouching, etc.) PROCESS - Coordination of Care X - Simple Patient / Family Education for ongoing care 1 15 '[]'$  - 0 Complex (extensive) Patient / Family Education for ongoing care X- 1 10 Staff obtains Programmer, systems, Records, Test Results / Process Orders '[]'$  - 0 Staff telephones HHA, Nursing Homes / Clarify orders / etc '[]'$  - 0 Routine Transfer to another Facility (non-emergent condition) '[]'$  - 0 Routine Hospital Admission (non-emergent condition) X- 1 15 New Admissions / Biomedical engineer / Ordering NPWT, Apligraf, etc. '[]'$  - 0 Emergency Hospital Admission (emergent condition) X- 1 10 Simple Discharge Coordination '[]'$  - 0 Complex (extensive) Discharge Coordination PROCESS - Special Needs '[]'$  - Pediatric / Minor Patient Management 0 '[]'$  - 0 Isolation Patient Management '[]'$  - 0 Hearing / Language / Visual special needs '[]'$  - 0 Assessment of Community assistance (transportation, D/C planning, etc.) '[]'$  -  0 Additional assistance / Altered mentation '[]'$  - 0 Support Surface(s) Assessment (bed, cushion, seat, etc.) INTERVENTIONS - Wound Cleansing / Measurement X - Wound Imaging (photographs - any number of wounds) 1 5 '[]'$  - 0 Wound Tracing (instead of photographs) '[]'$  - 0 Simple Wound Measurement - one wound X- 3 5 Complex Wound Measurement - multiple wounds Goethe, Pryor Montes ([id number]) '[]'$  -  0 Simple Wound Cleansing - one wound X- 3 5 Complex Wound Cleansing - multiple wounds INTERVENTIONS - Wound Dressings '[]'$  - Small Wound Dressing one or multiple wounds 0 X- 3 15 Medium Wound Dressing one or multiple wounds '[]'$  - 0 Large Wound Dressing one or multiple wounds '[]'$  - 0 Application of Medications - injection INTERVENTIONS - Miscellaneous '[]'$  - External ear exam 0 '[]'$  - 0 Specimen Collection (cultures, biopsies, blood, body fluids, etc.) '[]'$  - 0 Specimen(s) / Culture(s) sent or taken to Lab for analysis '[]'$  - 0 Patient Transfer (multiple staff / Civil Service fast streamer / Similar devices) '[]'$  - 0 Simple Staple / Suture removal (25 or less) '[]'$  - 0 Complex Staple / Suture removal (26 or more) '[]'$  - 0 Hypo / Hyperglycemic Management (close monitor of Blood Glucose) '[]'$  - 0 Ankle / Brachial Index (ABI) - do not check if billed separately Has the patient been seen at the hospital within the last three years: Yes Total Score: 170 Level Of Care: New/Established - Level 5 Electronic Signature(s) Signed: 04/19/2021 3:52:56 PM By: Levora Dredge Entered By: Levora Dredge on 04/19/2021 11:45:23 [name] ([id number]) -------------------------------------------------------------------------------- Encounter Discharge Information Details Patient Name: [name] Date of Service: 04/18/2021 12:45 PM Medical Record Number: [id number] Patient Account Number: [id number] Date of Birth/Sex: Apr 13, 1955 (66 y.o. F) Treating RN: Levora Dredge Primary Care Llewellyn Schoenberger: Theodora Blow Other  Clinician: Referring Rubyann Lingle: Lanae Crumbly Treating Cynda Soule/Extender: Skipper Cliche in Treatment: 0 Encounter Discharge Information Items Discharge Condition: Stable Ambulatory Status: Wheelchair Discharge Destination: Home Transportation: Private Auto Accompanied By: husband Schedule Follow-up Appointment: Yes Clinical Summary of Care: Electronic Signature(s) Signed: 04/19/2021 12:05:36 PM By: Levora Dredge Entered By: Levora Dredge on 04/19/2021 12:05:35 [name] ([id number]) -------------------------------------------------------------------------------- Lower Extremity Assessment Details Patient Name: [name] Date of Service: 04/18/2021 12:45 PM Medical Record Number: [id number] Patient Account Number: [id number] Date of Birth/Sex: 10-09-1955 (66 y.o. F) Treating RN: Levora Dredge Primary Care Therin Vetsch: Theodora Blow Other Clinician: Referring Kai Calico: Lanae Crumbly Treating Jasun Gasparini/Extender: Skipper Cliche in Treatment: 0 Edema Assessment Assessed: [Left: No] [Right: No] Edema: [Left: Yes] [Right: Yes] Calf Left: Right: Point of Measurement: 34 cm From Medial Instep 70.2 cm 71 cm Ankle Left: Right: Point of Measurement: 10 cm From Medial Instep 38.2 cm 38.5 cm Notes unable to check pedal pulses due to lymphedema, unable to complete ABI due to size of legs Electronic Signature(s) Signed: 04/18/2021 5:07:45 PM By: Levora Dredge Entered By: Levora Dredge on 04/18/2021 13:32:33 [name] ([id number]) -------------------------------------------------------------------------------- Multi Wound Chart Details Patient Name: [name] Date of Service: 04/18/2021 12:45 PM Medical Record Number: [id number] Patient Account Number: [id number] Date of Birth/Sex: February 04, 1956 (66 y.o. 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 Secondary Etiology: N/A Lymphedema N/A Comorbid History: Chronic sinus problems/congestion, Chronic sinus problems/congestion, Chronic sinus problems/congestion, Lymphedema, Congestive Heart Lymphedema, Congestive Heart Lymphedema, Congestive Heart Failure, Hypertension, Type II Failure, Hypertension, Type II Failure, Hypertension, Type II Diabetes, Neuropathy Diabetes, Neuropathy Diabetes, Neuropathy Date Acquired: 04/13/2020 04/18/2021 04/18/2021 Weeks of Treatment: 0 0 0 Wound Status: Open Open Open Wound Recurrence:  No No No Measurements L x W x D (cm) 1x1x0.3 0.5x0.4x0.2 0.3x0.3x0.1 Area (cm) : 0.785 0.157 0.071 Volume (cm) : 0.236 0.031 0.007 Starting Position 1 (o'clock): 9 Ending Position 1 (o'clock): 3 Maximum Distance 1 (cm): 0.5 Undermining: No Yes No Classification: Full Thickness Without Exposed Grade 3 Grade 3 Support Structures Exudate Amount: Medium Medium Medium Exudate Type: Serosanguineous Serosanguineous Serosanguineous Exudate Color: red, brown red, brown red, brown Granulation Amount: Small (1-33%) Medium (34-66%) Medium (34-66%) Granulation Quality: Pink Red Red Necrotic Amount: Medium (34-66%) Medium (34-66%) Medium (34-66%) Exposed Structures: Fat Layer (Subcutaneous Tissue): Fat Layer (Subcutaneous Tissue): Fat Layer (Subcutaneous Tissue): Yes Yes Yes Epithelialization: Small (1-33%) N/A None Treatment Notes Electronic Signature(s) Signed: 04/18/2021 5:07:45 PM By: Levora Dredge Entered By: Levora Dredge on 04/18/2021 14:03:11 [name] ([id number]) -------------------------------------------------------------------------------- Multi-Disciplinary Care Plan Details Patient Name: [name] Date of Service: 04/18/2021 12:45 PM Medical Record Number: [id number] Patient Account Number: [id number] Date of Birth/Sex: 14-Feb-1955 (66 y.o. F) Treating RN: Levora Dredge Primary Care Deontra Pereyra: Theodora Blow Other Clinician: Referring Avanti Jetter: Lanae Crumbly Treating Dameshia Seybold/Extender: Skipper Cliche in Treatment: 0 Active Inactive Orientation to the Wound Care Program Nursing Diagnoses: Knowledge deficit related to the wound healing center program Goals: Patient/caregiver will verbalize understanding of the [place] Program Date Initiated: 04/18/2021 Target Resolution Date: 04/18/2021 Goal Status: Active Interventions: Provide education on orientation to the wound center Notes: Wound/Skin Impairment Nursing Diagnoses: Impaired tissue integrity Knowledge deficit related to ulceration/compromised skin integrity Goals: Ulcer/skin breakdown will have a volume reduction of 30% by week 4 Date Initiated: 04/18/2021 Target Resolution Date: 05/16/2021 Goal Status: Active Ulcer/skin breakdown will have a volume reduction of 50% by week 8 Date Initiated: 04/18/2021 Target Resolution Date: 06/13/2021 Goal Status: Active Ulcer/skin breakdown will have a volume reduction of 80% by week 12 Date Initiated: 04/18/2021 Target Resolution Date: 07/11/2021 Goal Status: Active Ulcer/skin breakdown will heal within 14 weeks Date Initiated: 04/18/2021 Target Resolution Date: 07/25/2021 Goal Status: Active Interventions: Assess patient/caregiver ability to obtain necessary supplies Assess patient/caregiver ability to perform ulcer/skin care regimen upon admission and as needed Assess ulceration(s) every visit Provide education on ulcer and skin  care Notes: Electronic Signature(s) Signed: 04/18/2021 1:45:22 PM By: Levora Dredge Entered By: Levora Dredge on 04/18/2021 13:45:22 [name] ([id number]) -------------------------------------------------------------------------------- Non-Wound Condition Assessment Details Patient Name: [name] Date of Service: 04/18/2021 12:45 PM Medical Record Number: [id number] Patient Account Number: [id number] Date of Birth/Sex: 03/17/1955 (66 y.o. F) Treating RN: Levora Dredge Primary Care Linde Wilensky: Theodora Blow Other Clinician: Referring Emerald Gehres: Lanae Crumbly Treating Leon Montoya/Extender: Jeri Cos Weeks in Treatment: 0 Non-Wound Condition: Condition: Lymphedema Location: Leg Side: Bilateral Photos Electronic Signature(s) Signed: 04/18/2021 5:07:45 PM By: Levora Dredge Entered By: Levora Dredge on 04/18/2021 13:34:39 [name] ([id number]) -------------------------------------------------------------------------------- Pain Assessment Details Patient Name: [name] Date of Service: 04/18/2021 12:45 PM Medical Record Number: [id number] Patient Account Number: [id number] Date of Birth/Sex: 07/04/1955 (66 y.o. F) Treating RN: Levora Dredge Primary Care Timothey Dahlstrom: Theodora Blow Other Clinician: Referring Lucrezia Dehne: Lanae Crumbly Treating Kameria Canizares/Extender: Skipper Cliche in Treatment: 0 Active Problems Location of Pain Severity and Description of Pain Patient Has Paino No Site Locations Rate the pain. Current Pain Level: 0 Pain Management and Medication Current Pain Management: Electronic Signature(s) Signed: 04/18/2021 5:07:45 PM By: Levora Dredge Entered By: Levora Dredge on 04/18/2021 13:00:31 [name] ([id number]) -------------------------------------------------------------------------------- Patient/Caregiver Education Details Patient Name: [name] Date of Service: 04/18/2021 12:45 PM Medical Record Number:  [id number] Patient Account Number: [id number]  Date of Birth/Gender: Jan 15, 1956 (66 y.o. F) Treating RN: Levora Dredge Primary Care Physician: Theodora Blow Other Clinician: Referring Physician: Lanae Crumbly Treating Physician/Extender: Skipper Cliche in Treatment: 0 Education Assessment Education Provided To: Patient and Caregiver Education Topics Provided Welcome To [facility]: Handouts: Welcome To [facility] Methods: Explain/Verbal Responses: State content correctly Wound/Skin Impairment: Handouts: Caring for Your Ulcer Methods: Explain/Verbal Responses: State content correctly Electronic Signature(s) Signed: 04/19/2021 3:52:56 PM By: Levora Dredge Entered By: Levora Dredge on 04/19/2021 12:04:50 [name] ([id number]) -------------------------------------------------------------------------------- Wound Assessment Details Patient Name: [name] Date of Service: 04/18/2021 12:45 PM Medical Record Number: [id number] Patient Account Number: [id number] Date of Birth/Sex: 03-Aug-1955 (66 y.o. F) Treating RN: Levora Dredge Primary Care Melissa Tomaselli: Theodora Blow Other Clinician: Referring Ariea Rochin: Lanae Crumbly Treating Sriyan Cutting/Extender: Skipper Cliche in Treatment: 0 Wound Status Wound Number: 1 Primary Lymphedema Etiology: Wound Location: Right Foot Wound Open Wounding Event: Gradually Appeared Status: Date Acquired: 04/13/2020 Notes: pt states has been dealing with this wound for over a year Weeks Of Treatment: 0 Comorbid Chronic sinus problems/congestion, Lymphedema, Clustered Wound: No History: Congestive Heart Failure, Hypertension, Type II Diabetes, Neuropathy Photos Wound Measurements Length: (cm) 1 Width: (cm) 1 Depth: (cm) 0.3 Area: (cm) 0.785 Volume: (cm) 0.236 % Reduction in Area: % Reduction in Volume: Epithelialization: Small (1-33%) Tunneling: No Undermining: No Wound  Description Classification: Full Thickness Without Exposed Support Structu Exudate Amount: Medium Exudate Type: Serosanguineous Exudate Color: red, brown res Foul Odor After Cleansing: No Slough/Fibrino Yes Wound Bed Granulation Amount: Small (1-33%) Exposed Structure Granulation Quality: Pink Fat Layer (Subcutaneous Tissue) Exposed: Yes Necrotic Amount: Medium (34-66%) Necrotic Quality: Adherent Therapist, music) Signed: 04/18/2021 5:07:45 PM By: Levora Dredge Entered By: Levora Dredge on 04/18/2021 13:33:36 [name] ([id number]) -------------------------------------------------------------------------------- Wound Assessment Details Patient Name: [name] Date of Service: 04/18/2021 12:45 PM Medical Record Number: [id number] Patient Account Number: [id number] Date of Birth/Sex: March 06, 1955 (66 y.o. F) Treating RN: Levora Dredge Primary Care Taria Castrillo: Theodora Blow Other Clinician: Referring Ibn Stief: Lanae Crumbly Treating Timothy Trudell/Extender: Skipper Cliche in Treatment: 0 Wound Status Wound Number: 2 Primary Diabetic Wound/Ulcer of the Lower Extremity Etiology: Wound Location: Left Toe Third Secondary Lymphedema Wounding Event: Gradually Appeared Etiology: Date Acquired: 04/18/2021 Wound Open Weeks Of Treatment: 0 Status: Clustered Wound: No Notes: pt was unsure on open area found on appointment but did state she gets her nails trimmed Comorbid Chronic sinus problems/congestion, Lymphedema, History: Congestive Heart Failure, Hypertension, Type II Diabetes, Neuropathy Photos Wound Measurements Length: (cm) 0.5 % Reducti Width: (cm) 0.4 % Reducti Depth: (cm) 0.2 Tunneling Area: (cm) 0.157 Undermin Volume: (cm) 0.031 Starti Ending Maximum on in Area: on in Volume: : No ing: Yes ng Position (o'clock): 9 Position (o'clock): 3 Distance: (cm) 0.5 Wound Description Classification: Grade 3 Foul Odor Exudate Amount: Medium  Slough/Fi Exudate Type: Serosanguineous Exudate Color: red, brown After Cleansing: No brino Yes Wound Bed Granulation Amount: Medium (34-66%) Exposed Structure Granulation Quality: Red Fat Layer (Subcutaneous Tissue) Exposed: Yes Necrotic Amount: Medium (34-66%) Necrotic Quality: Adherent Slough Treatment Notes Wound #2 (Toe Third) Wound Laterality: Left Cleanser Normal Saline SIEDAH, SEDOR ([id number]) Discharge Instruction: Wash your hands with soap and water. Remove old dressing, discard into plastic bag and place into trash. Cleanse the wound with Normal Saline prior to applying a clean dressing using gauze sponges, not tissues or cotton balls. Do not scrub or use excessive force. Pat dry using gauze sponges, not tissue or cotton balls. Peri-Wound Care  Topical Primary Dressing Silvercel 4 1/4x 4 1/4 (in/in) Discharge Instruction: Apply Silvercel 4 1/4x 4 1/4 (in/in) as instructed Secondary Dressing ABD Pad 5x9 (in/in) Discharge Instruction: Cover with ABD pad over silver cell and to any areas of weeping Kerlix 4.5 x 4.1 (in/yd) Discharge Instruction: Apply Kerlix 4.5 x 4.1 (in/yd) as instructed Secured With ACE WRAP - 47M ACE Elastic Bandage With VELCRO Brand Closure, 4 (in) Compression Wrap Compression Stockings Add-Ons Electronic Signature(s) Signed: 04/18/2021 5:07:45 PM By: Levora Dredge Entered By: Levora Dredge on 04/18/2021 13:59:04 [name] ([id number]) -------------------------------------------------------------------------------- Wound Assessment Details Patient Name: [name] Date of Service: 04/18/2021 12:45 PM Medical Record Number: [id number] Patient Account Number: [id number] Date of Birth/Sex: 1955/03/10 (66 y.o. F) Treating RN: Levora Dredge Primary Care Kanetra Ho: Theodora Blow Other Clinician: Referring Lenay Lovejoy: Lanae Crumbly Treating Cambria Osten/Extender: Skipper Cliche in Treatment: 0 Wound Status Wound Number: 3 Primary  Diabetic Wound/Ulcer of the Lower Extremity Etiology: Wound Location: Left Toe Fifth Wound Open Wounding Event: Gradually Appeared Status: Date Acquired: 04/18/2021 Comorbid Chronic sinus problems/congestion, Lymphedema, Weeks Of Treatment: 0 History: Congestive Heart Failure, Hypertension, Type II Diabetes, Clustered Wound: No Neuropathy Photos Wound Measurements Length: (cm) 0.3 Width: (cm) 0.3 Depth: (cm) 0.1 Area: (cm) 0.071 Volume: (cm) 0.007 % Reduction in Area: % Reduction in Volume: Epithelialization: None Tunneling: No Undermining: No Wound Description Classification: Grade 3 Exudate Amount: Medium Exudate Type: Serosanguineous Exudate Color: red, brown Foul Odor After Cleansing: No Slough/Fibrino Yes Wound Bed Granulation Amount: Medium (34-66%) Exposed Structure Granulation Quality: Red Fat Layer (Subcutaneous Tissue) Exposed: Yes Necrotic Amount: Medium (34-66%) Necrotic Quality: Adherent Slough Electronic Signature(s) Signed: 04/18/2021 5:07:45 PM By: Levora Dredge Entered By: Levora Dredge on 04/18/2021 14:01:01 [name] ([id number]) -------------------------------------------------------------------------------- Vitals Details Patient Name: [name] Date of Service: 04/18/2021 12:45 PM Medical Record Number: [id number] Patient Account Number: [id number] Date of Birth/Sex: 1955-06-04 (66 y.o. F) Treating RN: Levora Dredge Primary Care Regina Coppolino: Theodora Blow Other Clinician: Referring Abbiegail Landgren: Lanae Crumbly Treating Kensley Valladares/Extender: Skipper Cliche in Treatment: 0 Vital Signs Time Taken: 13:00 Temperature (F): 97.6 Height (in): 64 Pulse (bpm): 73 Source: Stated Respiratory Rate (breaths/min): 18 Weight (lbs): 360 Blood Pressure (mmHg): 189/81 Source: Stated Reference Range: 80 - 120 mg / dl Body Mass Index (BMI): 61.8 Notes pt asymptomatic, PA Stone made aware Electronic Signature(s) Signed: 04/18/2021  3:13:50 PM By: Levora Dredge Entered By: Levora Dredge on 04/18/2021 15:13:50

## 2021-04-19 ENCOUNTER — Other Ambulatory Visit (INDEPENDENT_AMBULATORY_CARE_PROVIDER_SITE_OTHER): Payer: Self-pay | Admitting: Physician Assistant

## 2021-04-19 DIAGNOSIS — L97509 Non-pressure chronic ulcer of other part of unspecified foot with unspecified severity: Secondary | ICD-10-CM

## 2021-04-19 DIAGNOSIS — E13621 Other specified diabetes mellitus with foot ulcer: Secondary | ICD-10-CM

## 2021-04-19 DIAGNOSIS — L97512 Non-pressure chronic ulcer of other part of right foot with fat layer exposed: Secondary | ICD-10-CM

## 2021-04-19 DIAGNOSIS — L97122 Non-pressure chronic ulcer of left thigh with fat layer exposed: Secondary | ICD-10-CM

## 2021-04-19 NOTE — Progress Notes (Signed)
Laura Mccarty, Laura Mccarty ([id number]) Visit Report for 04/18/2021 Chief Complaint Document Details Patient Name: Laura Mccarty, Laura Mccarty Date of Service: 04/18/2021 12:45 PM Medical Record Number: [id number] Patient Account Number: [id number] Date of Birth/Sex: 1955/10/30 (66 y.o. F) Treating RN: Levora Dredge Primary Care Provider: Theodora Blow Other Clinician: Referring Provider: Lanae Crumbly Treating Provider/Extender: Skipper Cliche in Treatment: 0 Information Obtained from: Patient Chief Complaint Left foot ulcer Electronic Signature(s) Signed: 04/18/2021 1:51:23 PM By: Worthy Keeler PA-C Entered By: Worthy Keeler on 04/18/2021 13:51:23 Laura Mccarty ([id number]) -------------------------------------------------------------------------------- HPI Details Patient Name: Laura Mccarty Date of Service: 04/18/2021 12:45 PM Medical Record Number: [id number] Patient Account Number: [id number] Date of Birth/Sex: 1955/12/16 (65 y.o. F) Treating RN: Levora Dredge Primary Care Provider: Theodora Blow Other Clinician: Referring Provider: Lanae Crumbly Treating Provider/Extender: Skipper Cliche in Treatment: 0 History of Present Illness HPI Description: 04/18/2021 upon evaluation today patient presents for initial inspection here in the clinic regarding issues that she has been having with her bilateral lower extremities and feet. The right plantar foot ulcer she has had for about a year she tells me. With that being said the left plantar foot ulcer which is actually more on the toe of the third toe and fifth toes are much more new in fact she was not aware that was there until we saw them today. Fortunately I do not see any evidence of active infection locally or systemically which is great news. No fever chills noted the patient does have several chronic major medical problems that do contribute to the issue going on at hand. Patient does have a history of diabetes mellitus type  2, lymphedema, hypertension, congestive heart failure, and chronic kidney disease stage III. She does have lymphedema pumps though she tells me she is not really able to use them due to how much the bother and hurt her. Electronic Signature(s) Signed: 04/18/2021 2:42:39 PM By: Worthy Keeler PA-C Entered By: Worthy Keeler on 04/18/2021 14:42:39 Laura Mccarty ([id number]) -------------------------------------------------------------------------------- Physical Exam Details Patient Name: Laura Mccarty Date of Service: 04/18/2021 12:45 PM Medical Record Number: [id number] Patient Account Number: [id number] Date of Birth/Sex: September 03, 1955 (65 y.o. F) Treating RN: Levora Dredge Primary Care Provider: Theodora Blow Other Clinician: Referring Provider: Lanae Crumbly Treating Provider/Extender: Skipper Cliche in Treatment: 0 Constitutional patient is hypertensive.. pulse regular and within target range for patient.Marland Kitchen respirations regular, non-labored and within target range for patient.Marland Kitchen temperature within target range for patient.. Well-nourished and well-hydrated in no acute distress. Eyes conjunctiva clear no eyelid edema noted. pupils equal round and reactive to light and accommodation. Ears, Nose, Mouth, and Throat no gross abnormality of ear auricles or external auditory canals. normal hearing noted during conversation. mucus membranes moist. Respiratory normal breathing without difficulty. Cardiovascular Absent posterior tibial and dorsalis pedis pulses bilateral lower extremities. Patient has stage III bilateral lymphedema of the lower extremities.. Musculoskeletal Patient unable to walk without assistance. Psychiatric this patient is able to make decisions and demonstrates good insight into disease process. Alert and Oriented x 3. pleasant and cooperative. Notes Upon inspection patient's wound bed actually showed signs of fairly good granulation epithelization at this  point. Fortunately I do not see any evidence of active infection locally or systemically which is great news. No fevers, chills, nausea, vomiting, or diarrhea. She does have some slough and biofilm noted over some of the wounds as well as callus although no sharp debridement was performed at this point due to the fact that the patient has  unknown vascular status we were not able to get ABIs here in the clinic. I do want to send her to vascular for further evaluation of this and see what they find. Electronic Signature(s) Signed: 04/18/2021 2:43:51 PM By: Worthy Keeler PA-C Entered By: Worthy Keeler on 04/18/2021 14:43:51 Laura Mccarty ([id number]) -------------------------------------------------------------------------------- Physician Orders Details Patient Name: Laura Mccarty Date of Service: 04/18/2021 12:45 PM Medical Record Number: [id number] Patient Account Number: [id number] Date of Birth/Sex: 1955/07/22 (65 y.o. 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 o Nurse Visit as needed [facility] for wound care. May utilize formulary equivalent dressing for wound treatment orders unless otherwise specified. Home Health Nurse  may visit [facility] to address patientos wound care needs. - 2 x week by home health and once weekly at wound clinic o Scheduled days for dressing changes to be completed; exception, patient has scheduled wound care visit that day. o **Please direct any NON-WOUND related issues/requests for orders to patient's Primary Care Physician. **If current dressing causes regression in wound condition, may D/C ordered dressing product/s and apply Normal Saline Moist Dressing daily until next [place] or Other MD appointment. **Notify [facility] of regression in wound condition at [phone]. Bathing/ Shower/ Hygiene o May shower with wound dressing protected with water repellent cover or cast protector. o No tub bath. Edema Control - Lymphedema / Segmental Compressive Device / Other o Elevate, Exercise Daily and Avoid Standing for Long Periods of Time. o Elevate leg(s) parallel to the floor when sitting. o DO YOUR BEST to sleep in the bed at night. DO NOT sleep in your recliner. Long hours of sitting in a recliner leads to swelling of the legs and/or potential wounds on your backside. Wound Treatment Wound #1 - Foot Wound Laterality: Right Cleanser: Normal Saline 3 x Per Week/30 Days Discharge Instructions: Wash your hands with soap and water. Remove old dressing, discard into plastic bag and place into trash. Cleanse the wound with Normal Saline prior to applying a clean dressing using gauze sponges, not tissues or cotton balls. Do not scrub or use excessive force. Pat dry using gauze sponges, not tissue or cotton balls. Primary Dressing: Silvercel 4 1/4x 4 1/4 (in/in) 3 x Per Week/30 Days Discharge Instructions: Apply to wound areas Secondary Dressing: ABD Pad 5x9 (in/in) 3 x Per Week/30 Days Discharge Instructions: Applied over silver cell and placed on areas of weeping Secondary Dressing: Kerlix 4.5 x 4.1 (in/yd) 3 x Per Week/30 Days Discharge Instructions: Apply  Kerlix 4.5 x 4.1 (in/yd) as instructed Secured With: ACE WRAP - 38M ACE Elastic Bandage With VELCRO Brand Closure, 4 (in) 3 x Per Week/30 Days Wound #2 - Toe Third Wound Laterality: Left Christiansen, Pryor Montes ([id number]) Cleanser: Normal Saline 3 x Per Week/30 Days Discharge Instructions: Wash your hands with soap and water. Remove old dressing, discard into plastic bag and place into  trash. Cleanse the wound with Normal Saline prior to applying a clean dressing using gauze sponges, not tissues or cotton balls. Do not scrub or use excessive force. Pat dry using gauze sponges, not tissue or cotton balls. Primary Dressing: Silvercel 4 1/4x 4 1/4 (in/in) 3 x Per Week/30 Days Discharge Instructions: Apply Silvercel 4 1/4x 4 1/4 (in/in) as instructed Secondary Dressing: ABD Pad 5x9 (in/in) 3 x Per Week/30 Days Discharge Instructions: Cover with ABD pad over silver cell and to any areas of weeping Secondary Dressing: Kerlix 4.5 x 4.1 (in/yd) 3 x Per Week/30 Days Discharge Instructions: Apply Kerlix 4.5 x 4.1 (in/yd) as instructed Secured With: ACE WRAP - 47M ACE Elastic Bandage With VELCRO Brand Closure, 4 (in) 3 x Per Week/30 Days Wound #3 - Toe Fifth Wound Laterality: Left Cleanser: Normal Saline 3 x Per Week/30 Days Discharge Instructions: Wash your hands with soap and water. Remove old dressing, discard into plastic bag and place into trash. Cleanse the wound with Normal Saline prior to applying a clean dressing using gauze sponges, not tissues or cotton balls. Do not scrub or use excessive force. Pat dry using gauze sponges, not tissue or cotton balls. Primary Dressing: Silvercel 4 1/4x 4 1/4 (in/in) 3 x Per Week/30 Days Discharge Instructions: Apply Silvercel 4 1/4x 4 1/4 (in/in) as instructed Secondary Dressing: ABD Pad 5x9 (in/in) 3 x Per Week/30 Days Discharge Instructions: Cover with ABD pad over silvercell and to any areas of weeping Secondary Dressing: Kerlix 4.5 x 4.1 (in/yd) 3 x Per Week/30  Days Discharge Instructions: Apply Kerlix 4.5 x 4.1 (in/yd) as instructed Secured With: ACE WRAP - 47M ACE Elastic Bandage With VELCRO Brand Closure, 4 (in) 3 x Per Week/30 Days Services and Therapies o Arterial Studies- Bilateral - ABI and TBI bilateral Electronic Signature(s) Signed: 04/18/2021 5:07:45 PM By: Levora Dredge Signed: 04/18/2021 7:37:05 PM By: Worthy Keeler PA-C Entered By: Levora Dredge on 04/18/2021 15:49:53 Laura Mccarty ([id number]) -------------------------------------------------------------------------------- Problem List Details Patient Name: Laura Mccarty Date of Service: 04/18/2021 12:45 PM Medical Record Number: [id number] Patient Account Number: [id number] Date of Birth/Sex: [birth year] (65 y.o. 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 04/18/2021 No Yes I10 Essential (primary) hypertension 04/18/2021 No Yes I50.42 Chronic combined systolic (congestive) and diastolic (congestive) heart 04/18/2021 No Yes failure N18.30 Chronic kidney disease, stage 3 unspecified 04/18/2021 No Yes Inactive Problems Resolved Problems Electronic Signature(s) Signed: 04/18/2021 1:52:28 PM By: Worthy Keeler PA-C Previous Signature: 04/18/2021 1:51:03 PM Version By: Worthy Keeler PA-C Entered By: Worthy Keeler on 04/18/2021 13:52:28 Laura Mccarty ([id number]) -------------------------------------------------------------------------------- Progress Note  Details Patient Name: Laura Mccarty Date of Service: 04/18/2021 12:45 PM Medical Record Number: [id number] Patient Account Number: [id number] Date of Birth/Sex: 1955-04-29 (65 y.o. F) Treating RN: Levora Dredge Primary Care Provider: Theodora Blow Other Clinician: Referring Provider: Lanae Crumbly Treating Provider/Extender: Skipper Cliche in Treatment: 0 Subjective Chief Complaint Information obtained from Patient Left foot ulcer History of Present Illness (HPI) 04/18/2021 upon evaluation today patient presents for initial inspection here in the clinic regarding issues that  she has been having with her bilateral lower extremities and feet. The right plantar foot ulcer she has had for about a year she tells me. With that being said the left plantar foot ulcer which is actually more on the toe of the third toe and fifth toes are much more new in fact she was not aware that was there until we saw them today. Fortunately I do not see any evidence of active infection locally or systemically which is great news. No fever chills noted the patient does have several chronic major medical problems that do contribute to the issue going on at hand. Patient does have a history of diabetes mellitus type 2, lymphedema, hypertension, congestive heart failure, and chronic kidney disease stage III. She does have lymphedema pumps though she tells me she is not really able to use them due to how much the bother and hurt her. Patient History Information obtained from Patient. Allergies codeine (Severity: Moderate, Reaction: nausea and vomiting) Social History Never smoker, Marital Status - Married, Alcohol Use - Rarely, Drug Use - No History, Caffeine Use - Daily - tea. Medical History Ear/Nose/Mouth/Throat Patient has history of Chronic sinus problems/congestion Hematologic/Lymphatic Patient has history of Lymphedema Cardiovascular Patient has history of Congestive Heart Failure,  Hypertension Endocrine Patient has history of Type II Diabetes Neurologic Patient has history of Neuropathy Patient is treated with Oral Agents. Blood sugar is not tested. Medical And Surgical History Notes Genitourinary kidney disease stage 3 Musculoskeletal arthritis hips and knees Review of Systems (ROS) Constitutional Symptoms (General Health) Denies complaints or symptoms of Fatigue, Fever, Chills, Marked Weight Change. 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 Cardiovascular Complains or has symptoms of LE edema. Gastrointestinal Denies complaints or symptoms of Frequent diarrhea, Nausea, Vomiting. Genitourinary Denies complaints or symptoms of Kidney failure/ Dialysis. Immunological Denies complaints or symptoms of Hives, Itching. Integumentary (Skin) Complains or has symptoms of Wounds. Oncologic skin Psychiatric Laura Mccarty, Laura Mccarty ([id number]) Denies complaints or symptoms of Anxiety, Claustrophobia. Objective Constitutional patient is hypertensive.. pulse regular and within target range for patient.Marland Kitchen respirations regular, non-labored and within target range for patient.Marland Kitchen temperature within target range for patient.. Well-nourished and well-hydrated in no acute distress. Vitals Time Taken: 1:00 PM, Height: 64 in, Source: Stated, Weight: 360 lbs, Source: Stated, BMI: 61.8, Temperature: 97.6 F, Pulse: 73 bpm, Respiratory Rate: 18 breaths/min, Blood Pressure: 189/81 mmHg. Eyes conjunctiva clear no eyelid edema noted. pupils equal round and reactive to light and accommodation. Ears, Nose, Mouth, and Throat no gross abnormality of ear auricles or external auditory canals. normal hearing noted during conversation. mucus membranes moist. Respiratory normal breathing without difficulty. Cardiovascular Absent posterior tibial and dorsalis  pedis pulses bilateral lower extremities. Patient has stage III bilateral lymphedema of the lower extremities.. Musculoskeletal Patient unable to walk without assistance. Psychiatric this patient is able to make decisions and demonstrates good insight into disease process. Alert and Oriented x 3. pleasant and cooperative. General Notes: Upon inspection patient's wound bed actually showed signs of fairly good granulation epithelization at this point. Fortunately I do not see any evidence of active infection locally or systemically which is great news. No fevers, chills, nausea, vomiting, or diarrhea. She does have some slough and biofilm noted over some of the wounds as well as callus although no sharp debridement was performed at this point due to the fact that the patient has unknown vascular status we  were not able to get ABIs here in the clinic. I do want to send her to vascular for further evaluation of this and see what they find. Integumentary (Hair, Skin) Wound #1 status is Open. Original cause of wound was Gradually Appeared. The date acquired was: 04/13/2020. The wound is located on the Right Foot. The wound measures 1cm length x 1cm width x 0.3cm depth; 0.785cm^2 area and 0.236cm^3 volume. There is Fat Layer (Subcutaneous Tissue) exposed. There is no tunneling or undermining noted. There is a medium amount of serosanguineous drainage noted. There is small (1-33%) pink granulation within the wound bed. There is a medium (34-66%) amount of necrotic tissue within the wound bed including Adherent Slough. Wound #2 status is Open. Original cause of wound was Gradually Appeared. The date acquired was: 04/18/2021. The wound is located on the Left Toe Third. The wound measures 0.5cm length x 0.4cm width x 0.2cm depth; 0.157cm^2 area and 0.031cm^3 volume. There is Fat Layer (Subcutaneous Tissue) exposed. There is no tunneling noted, however, there is undermining starting at 9:00 and ending at 3:00 with  a maximum distance of 0.5cm. There is a medium amount of serosanguineous drainage noted. There is medium (34-66%) red granulation within the wound bed. There is a medium (34-66%) amount of necrotic tissue within the wound bed including Adherent Slough. Wound #3 status is Open. Original cause of wound was Gradually Appeared. The date acquired was: 04/18/2021. The wound is located on the Left Toe Fifth. The wound measures 0.3cm length x 0.3cm width x 0.1cm depth; 0.071cm^2 area and 0.007cm^3 volume. There is Fat Layer (Subcutaneous Tissue) exposed. There is no tunneling or undermining noted. There is a medium amount of serosanguineous drainage noted. There is medium (34-66%) red granulation within the wound bed. There is a medium (34-66%) amount of necrotic tissue within the wound bed including Adherent Slough. Other Condition(s) Patient presents with Lymphedema located on the Bilateral Leg. Assessment Active Problems ICD-10 Type 2 diabetes mellitus with foot ulcer Lymphedema, not elsewhere classified Non-pressure chronic ulcer of other part of right foot with fat layer exposed Laura Mccarty, Laura Mccarty ([id number]) Non-pressure chronic ulcer of left thigh with fat layer exposed Essential (primary) hypertension Chronic combined systolic (congestive) and diastolic (congestive) heart failure Chronic kidney disease, stage 3 unspecified Plan Follow-up Appointments: Return Appointment in 1 week. Nurse Visit as needed Home Health: [facility] for [facility] care. May utilize formulary equivalent dressing for wound treatment orders unless otherwise specified. Home Health Nurse may visit PRN to address patient s wound care needs. Scheduled days for dressing changes to be completed; exception, patient has scheduled wound care visit that day. **Please direct any NON-WOUND related issues/requests for orders to patient's Primary Care Physician. **If current dressing causes regression in wound condition, may D/C  ordered dressing product/s and apply Normal Saline Moist Dressing daily until next [place] or Other MD appointment. **Notify [facility] of regression in wound condition at [phone]. Bathing/ Shower/ Hygiene: May shower with wound dressing protected with water repellent cover or cast protector. No tub bath. Edema Control - Lymphedema / Segmental Compressive Device / Other: Elevate, Exercise Daily and Avoid Standing for Long Periods of Time. Elevate leg(s) parallel to the floor when sitting. DO YOUR BEST to sleep in the bed at night. DO NOT sleep in your recliner. Long hours of sitting in a recliner leads to swelling of the legs and/or potential wounds on your backside. Services and Therapies ordered were: Arterial Studies- Bilateral - ABI and TBI  bilateral WOUND #1: - Foot Wound Laterality: Right Cleanser: Normal Saline 3 x Per Week/30 Days Discharge Instructions: Wash your hands with soap and water. Remove old dressing, discard into plastic bag and place into trash. Cleanse the wound with Normal Saline prior to applying a clean dressing using gauze sponges, not tissues or cotton balls. Do not scrub or use excessive force. Pat dry using gauze sponges, not tissue or cotton balls. Primary Dressing: Silvercel 4 1/4x 4 1/4 (in/in) 3 x Per Week/30 Days Discharge Instructions: Apply Silvercel 4 1/4x 4 1/4 (in/in) as instructed Secondary Dressing: ABD Pad 5x9 (in/in) 3 x Per Week/30 Days Discharge Instructions: Cover with ABD pad Secondary Dressing: Kerlix 4.5 x 4.1 (in/yd) 3 x Per Week/30 Days Discharge Instructions: Apply Kerlix 4.5 x 4.1 (in/yd) as instructed Secured With: ACE WRAP - 54M ACE Elastic Bandage With VELCRO Brand Closure, 4 (in) 3 x Per Week/30 Days WOUND #2: - Toe Third Wound Laterality: Left Cleanser: Normal Saline 3 x Per Week/30 Days Discharge Instructions: Wash your hands with soap and water. Remove old dressing, discard into plastic bag and place  into trash. Cleanse the wound with Normal Saline prior to applying a clean dressing using gauze sponges, not tissues or cotton balls. Do not scrub or use excessive force. Pat dry using gauze sponges, not tissue or cotton balls. Primary Dressing: Silvercel 4 1/4x 4 1/4 (in/in) 3 x Per Week/30 Days Discharge Instructions: Apply Silvercel 4 1/4x 4 1/4 (in/in) as instructed Secondary Dressing: ABD Pad 5x9 (in/in) 3 x Per Week/30 Days Discharge Instructions: Cover with ABD pad Secondary Dressing: Kerlix 4.5 x 4.1 (in/yd) 3 x Per Week/30 Days Discharge Instructions: Apply Kerlix 4.5 x 4.1 (in/yd) as instructed Secured With: ACE WRAP - 54M ACE Elastic Bandage With VELCRO Brand Closure, 4 (in) 3 x Per Week/30 Days WOUND #3: - Toe Fifth Wound Laterality: Left Cleanser: Normal Saline 3 x Per Week/30 Days Discharge Instructions: Wash your hands with soap and water. Remove old dressing, discard into plastic bag and place into trash. Cleanse the wound with Normal Saline prior to applying a clean dressing using gauze sponges, not tissues or cotton balls. Do not scrub or use excessive force. Pat dry using gauze sponges, not tissue or cotton balls. Primary Dressing: Silvercel 4 1/4x 4 1/4 (in/in) 3 x Per Week/30 Days Discharge Instructions: Apply Silvercel 4 1/4x 4 1/4 (in/in) as instructed Secondary Dressing: ABD Pad 5x9 (in/in) 3 x Per Week/30 Days Discharge Instructions: Cover with ABD pad Secondary Dressing: Kerlix 4.5 x 4.1 (in/yd) 3 x Per Week/30 Days Discharge Instructions: Apply Kerlix 4.5 x 4.1 (in/yd) as instructed Secured With: ACE WRAP - 54M ACE Elastic Bandage With VELCRO Brand Closure, 4 (in) 3 x Per Week/30 Days 1. Would recommend currently that based on what I am seeing we will go ahead and initiate treatment with a silver alginate dressing to the wound locations I think this is going to be the ideal thing to start with. 2. I am also can recommend that we continue with a ABD pad to cover any  of the open wound locations. This includes the areas of weeping and lymphedema on the legs. 3. I am also going to suggest that we use roll gauze to secure in place followed by an Ace wrap which is what home health has been doing. Laura Mccarty, Laura Mccarty ([id number]) 4. I am also can recommend that we have the patient continue to elevate her legs much as possible I do think lymphedema pumps would be  ideal but again I am not certain that she is going even be able to do this. 5. I am going to suggest that we go ahead and send the patient for arterial study with ABI and TBI think this is going to be the best thing to do to further evaluate the situation here. We will see patient back for reevaluation in 1 week here in the clinic. If anything worsens or changes patient will contact our office for additional recommendations. Electronic Signature(s) Signed: 04/18/2021 2:45:17 PM By: Worthy Keeler PA-C Previous Signature: 04/18/2021 2:44:38 PM Version By: Worthy Keeler PA-C Entered By: Worthy Keeler on 04/18/2021 14:45:16 Laura Mccarty ([id number]) -------------------------------------------------------------------------------- ROS/PFSH Details Patient Name: Laura Mccarty Date of Service: 04/18/2021 12:45 PM Medical Record Number: [id number] Patient Account Number: [id number] Date of Birth/Sex: 03/25/1955 (65 y.o. 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 Respiratory Complaints and Symptoms: Negative for: Chronic or frequent coughs; Shortness of  Breath Cardiovascular Complaints and Symptoms: Positive for: LE edema Medical History: Positive for: Congestive Heart Failure; Hypertension Gastrointestinal Complaints and Symptoms: Negative for: Frequent diarrhea; Nausea; Vomiting Genitourinary Complaints and Symptoms: Negative for: Kidney failure/ Dialysis Medical History: Past Medical History Notes: kidney disease stage 3 Immunological Complaints and Symptoms: Negative for: Hives; Itching Integumentary (Skin) Complaints and Symptoms: Positive for: Wounds Psychiatric Complaints and Symptoms: Negative for: Anxiety; Claustrophobia Ear/Nose/Mouth/Throat Laura Mccarty, Laura Mccarty ([id number]) Medical History: Positive for: Chronic sinus problems/congestion Hematologic/Lymphatic Medical History: Positive for: Lymphedema Endocrine Medical History: Positive for: Type II Diabetes Time with diabetes: 10 year s Treated with: Oral agents Blood sugar tested every day: No Musculoskeletal Medical History: Past Medical History Notes: arthritis hips and knees Neurologic Medical History: Positive for: Neuropathy Oncologic Complaints and Symptoms: Review of System Notes: skin HBO Extended History Items Ear/Nose/Mouth/Throat: Chronic sinus problems/congestion Immunizations Pneumococcal Vaccine: Received Pneumococcal Vaccination: No Tetanus Vaccine: Last tetanus shot: 02/13/2004 Implantable Devices None Family and Social History Never smoker; Marital Status - Married; Alcohol Use: Rarely; Drug Use: No History; Caffeine Use: Daily - tea Electronic Signature(s) Signed: 04/18/2021 5:07:45 PM By: Levora Dredge Signed: 04/18/2021 7:37:05 PM By: Worthy Keeler PA-C Entered By: Levora Dredge on 04/18/2021 13:09:11 Laura Mccarty ([id number]) -------------------------------------------------------------------------------- SuperBill Details Patient Name: Laura Mccarty Date of Service: 04/18/2021 Medical Record Number: [id number] Patient  Account Number: [id number] Date of Birth/Sex: [birth year] (65 y.o. 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 I10 Essential (primary) hypertension I50.42 Chronic combined systolic (congestive) and diastolic (congestive) heart failure N18.30 Chronic kidney disease, stage 3 unspecified Physician Procedures CPT4 Code: 3295188 Description: 41660 - WC PHYS LEVEL 4 - NEW PT Modifier: Quantity: 1 CPT4 Code: Description: ICD-10 Diagnosis Description E11.621 Type 2 diabetes mellitus with foot ulcer I89.0 Lymphedema, not elsewhere classified L97.512 Non-pressure chronic ulcer of other part of right foot with fat layer e L97.122 Non-pressure chronic ulcer of  left thigh with fat layer exposed Modifier: xposed Quantity: Electronic Signature(s) Signed: 04/18/2021 2:44:55 PM By: Worthy Keeler PA-C Entered By: Worthy Keeler on 04/18/2021 14:44:55

## 2021-04-25 ENCOUNTER — Other Ambulatory Visit: Payer: Self-pay

## 2021-04-25 ENCOUNTER — Encounter (HOSPITAL_BASED_OUTPATIENT_CLINIC_OR_DEPARTMENT_OTHER): Payer: Medicare HMO | Admitting: Internal Medicine

## 2021-04-25 DIAGNOSIS — E11621 Type 2 diabetes mellitus with foot ulcer: Secondary | ICD-10-CM

## 2021-04-25 DIAGNOSIS — I890 Lymphedema, not elsewhere classified: Secondary | ICD-10-CM

## 2021-04-25 DIAGNOSIS — L97512 Non-pressure chronic ulcer of other part of right foot with fat layer exposed: Secondary | ICD-10-CM

## 2021-04-25 DIAGNOSIS — I5042 Chronic combined systolic (congestive) and diastolic (congestive) heart failure: Secondary | ICD-10-CM

## 2021-04-25 NOTE — Progress Notes (Signed)
ZADIA, UHDE ([id number]) ?Visit Report for 04/25/2021 ?Chief Complaint Document Details ?Patient Name: Laura Mccarty, Laura Mccarty ?Date of Service: 04/25/2021 9:00 AM ?Medical Record Number: [id number] ?Patient Account Number: [id number] ?Date of Birth/Sex: Nov 15, 1955 (66 y.o. F) ?Treating RN: Levora Dredge ?Primary Care Provider: Dillard Cannon Other Clinician: ?Referring Provider: Dillard Cannon ?Treating Provider/Extender: Kalman Shan ?Weeks in Treatment: 1 ?Information Obtained from: Patient ?Chief Complaint ?Left foot ulcer ?Electronic Signature(s) ?Signed: 04/25/2021 9:52:19 AM By: Kalman Shan DO ?Entered By: Kalman Shan on 04/25/2021 09:46:57 ?Laura Mccarty, Laura Mccarty ([id number]) ?-------------------------------------------------------------------------------- ?HPI Details ?Patient Name: Laura Mccarty, Laura Mccarty ?Date of Service: 04/25/2021 9:00 AM ?Medical Record Number: [id number] ?Patient Account Number: [id number] ?Date of Birth/Sex: Jan 03, 1956 (66 y.o. F) ?Treating RN: Levora Dredge ?Primary Care Provider: Dillard Cannon Other Clinician: ?Referring Provider: Dillard Cannon ?Treating Provider/Extender: Kalman Shan ?Weeks in Treatment: 1 ?History of Present Illness ?HPI Description: 04/18/2021 upon evaluation today patient presents for initial inspection here in the clinic regarding issues that she has been ?having with her bilateral lower extremities and feet. The right plantar foot ulcer she has had for about a year she tells me. With that being said ?the left plantar foot ulcer which is actually more on the toe of the third toe and fifth toes are much more new in fact she was not aware that was ?there until we saw them today. Fortunately I do not see any evidence of active infection locally or systemically which is great news. No fever chills ?noted the patient does have several chronic major medical problems that do contribute to the issue going on at hand. ?Patient does have a history of diabetes mellitus  type 2, lymphedema, hypertension, congestive heart failure, and chronic kidney disease stage III. ?She does have lymphedema pumps though she tells me she is not really able to use them due to how much the bother and hurt her. ?3/14; patient presents for follow-up. She has home health who comes out twice weekly to help with dressing changes. She has no issues or ?complaints today. She denies signs of infection. ?Electronic Signature(s) ?Signed: 04/25/2021 9:52:19 AM By: Kalman Shan DO ?Entered By: Kalman Shan on 04/25/2021 09:47:36 ?Laura Mccarty, Laura Mccarty ([id number]) ?-------------------------------------------------------------------------------- ?Physical Exam Details ?Patient Name: Laura Mccarty, Laura Mccarty ?Date of Service: 04/25/2021 9:00 AM ?Medical Record Number: [id number] ?Patient Account Number: [id number] ?Date of Birth/Sex: 1956-01-22 (66 y.o. F) ?Treating RN: Levora Dredge ?Primary Care Provider: Dillard Cannon Other Clinician: ?Referring Provider: Dillard Cannon ?Treating Provider/Extender: Kalman Shan ?Weeks in Treatment: 1 ?Constitutional ?. ?Cardiovascular ?Marland Kitchen ?Psychiatric ?Marland Kitchen ?Notes ?Right foot: To the plantar aspect there is an open wound with granulation tissue and nonviable tissue. No surrounding signs of infection. ?Left foot: To the third toe there is an open wound with granulation tissue present. To the fifth toe there is a scab over the previous wound site. No ?surrounding signs of infection. ?Electronic Signature(s) ?Signed: 04/25/2021 9:52:19 AM By: Kalman Shan DO ?Entered By: Kalman Shan on 04/25/2021 09:48:41 ?Laura Mccarty, Laura Mccarty ([id number]) ?-------------------------------------------------------------------------------- ?Physician Orders Details ?Patient Name: Laura Mccarty, Laura Mccarty ?Date of Service: 04/25/2021 9:00 AM ?Medical Record Number: [id number] ?Patient Account Number: [id number] ?Date of Birth/Sex: Dec 21, 1955 (66 y.o. F) ?Treating RN: Levora Dredge ?Primary Care Provider: Dillard Cannon Other Clinician: ?Referring Provider: Dillard Cannon ?Treating Provider/Extender: Kalman Shan ?Weeks in Treatment: 1 ?Verbal / Phone Orders: No ?Diagnosis Coding ?Follow-up Appointments ?o Return Appointment in 1 week. ?o Nurse Visit as needed ?Home Health ?[place]: Alvis Lemmings ?o [place] for wound care. May utilize formulary equivalent dressing for wound treatment orders unless ?otherwise  specified. Home Health Nurse may visit [facility] to address patientos wound care needs. - 2 x week by home health and ?once weekly at wound clinic ?o Scheduled days for dressing changes to be completed; exception, patient has scheduled wound care visit that day. ?o **Please direct any NON-WOUND related issues/requests for orders to patient's Primary Care Physician. **If current ?dressing causes regression in wound condition, may D/C ordered dressing product/s and apply Normal Saline Moist ?Dressing daily until next [place] or Other MD appointment. **Notify [facility] of regression in ?wound condition at [phone]. ?Bathing/ Shower/ Hygiene ?o May shower with wound dressing protected with water repellent cover or cast protector. ?o No tub bath. ?Edema Control - Lymphedema / Segmental Compressive Device / Other ?o Elevate, Exercise Daily and Avoid Standing for Long Periods of Time. ?o Elevate leg(s) parallel to the floor when sitting. ?o DO YOUR BEST to sleep in the bed at night. DO NOT sleep in your recliner. Long hours of sitting in a recliner leads to ?swelling of the legs and/or potential wounds on your backside. ?Wound Treatment ?Wound #1 - Foot Wound Laterality: Right ?Cleanser: Normal Saline 3 x Per Week/30 Days ?Discharge Instructions: Wash your hands with soap and water. Remove old dressing, discard into plastic bag and place into trash. ?Cleanse the wound with Normal Saline prior to applying a clean dressing using gauze sponges, not tissues  or cotton balls. Do not scrub ?or use excessive force. Pat dry using gauze sponges, not tissue or cotton balls. ?Primary Dressing: Silvercel 4 1/4x 4 1/4 (in/in) 3 x Per Week/30 Days ?Discharge Instructions: Apply to wound areas ?Secondary Dressing: ABD Pad 5x9 (in/in) 3 x Per Week/30 Days ?Discharge Instructions: Applied over silver cell and placed on areas of weeping ?Secondary Dressing: Kerlix 4.5 x 4.1 (in/yd) 3 x Per Week/30 Days ?Discharge Instructions: Apply Kerlix 4.5 x 4.1 (in/yd) as instructed ?Secured With: ACE WRAP - 1M ACE Elastic Bandage With VELCRO Brand Closure, 4 (in) 3 x Per Week/30 Days ?Wound #2 - Toe Third Wound Laterality: Left ?Cleanser: Normal Saline 3 x Per Week/30 Days ?Discharge Instructions: Wash your hands with soap and water. Remove old dressing, discard into plastic bag and place into trash. ?Cleanse the wound with Normal Saline prior to applying a clean dressing using gauze sponges, not tissues or cotton balls. Do not scrub ?or use excessive force. Pat dry using gauze sponges, not tissue or cotton balls. ?Primary Dressing: Silvercel 4 1/4x 4 1/4 (in/in) 3 x Per Week/30 Days ?Discharge Instructions: Apply Silvercel 4 1/4x 4 1/4 (in/in) as instructed ?Secondary Dressing: ABD Pad 5x9 (in/in) 3 x Per Week/30 Days ?Discharge Instructions: Cover with ABD pad over silver cell and to any areas of weeping ?Secondary Dressing: Kerlix 4.5 x 4.1 (in/yd) 3 x Per Week/30 Days ?Discharge Instructions: Apply Kerlix 4.5 x 4.1 (in/yd) as instructed ?Laura Mccarty, Laura Mccarty ([id number]) ?Secured With: ACE WRAP - 1M ACE Elastic Bandage With VELCRO Brand Closure, 4 (in) 3 x Per Week/30 Days ?Wound #3 - Toe Fifth Wound Laterality: Left ?Cleanser: Normal Saline 3 x Per Week/30 Days ?Discharge Instructions: Wash your hands with soap and water. Remove old dressing, discard into plastic bag and place into trash. ?Cleanse the wound with Normal Saline prior to applying a clean dressing using gauze sponges, not tissues or  cotton balls. Do not scrub ?or use excessive force. Pat dry using gauze sponges, not tissue or cotton balls. ?Primary Dressing: Silvercel 4 1/4x 4 1/4 (in/in) 3 x Per Week/30 Days ?  Discharge Instructions: A

## 2021-04-26 NOTE — Progress Notes (Signed)
BELICIA, DIFATTA ([id number]) ?Visit Report for 04/25/2021 ?Arrival Information Details ?Patient Name: Laura Mccarty, Laura Mccarty ?Date of Service: 04/25/2021 9:00 AM ?Medical Record Number: [id number] ?Patient Account Number: [id number] ?Date of Birth/Sex: 1956-01-28 (66 y.o. F) ?Treating RN: Levora Dredge ?Primary Care Terisa Belardo: Dillard Cannon Other Clinician: ?Referring Tenesha Garza: Dillard Cannon ?Treating Newton Frutiger/Extender: Kalman Shan ?Weeks in Treatment: 1 ?Visit Information History Since Last Visit ?Added or deleted any medications: No ?Patient Arrived: Wheel Chair ?Any new allergies or adverse reactions: No ?Arrival Time: 08:58 ?Had a fall or experienced change in No ?Accompanied By: husband ?activities of daily living that may affect ?Transfer Assistance: EasyPivot Patient Lift ?risk of falls: ?Patient Identification Verified: Yes ?Hospitalized since last visit: No ?Secondary Verification Process Completed: Yes ?Has Dressing in Place as Prescribed: Yes ?Patient Has Alerts: Yes ?Pain Present Now: No ?Patient Alerts: Patient on Blood Thinner ?type 2 diabetic ?partially blind ?Electronic Signature(s) ?Signed: 04/26/2021 3:41:20 PM By: Levora Dredge ?Entered By: Levora Dredge on 04/25/2021 09:07:22 ?Laura Mccarty, Laura Mccarty ([id number]) ?-------------------------------------------------------------------------------- ?Clinic Level of Care Assessment Details ?Patient Name: Laura Mccarty, Laura Mccarty ?Date of Service: 04/25/2021 9:00 AM ?Medical Record Number: [id number] ?Patient Account Number: [id number] ?Date of Birth/Sex: Mar 21, 1955 (66 y.o. F) ?Treating RN: Levora Dredge ?Primary Care Tahesha Skeet: Dillard Cannon Other Clinician: ?Referring Zayne Marovich: Dillard Cannon ?Treating Serenna Deroy/Extender: Kalman Shan ?Weeks in Treatment: 1 ?Clinic Level of Care Assessment Items ?TOOL 4 Quantity Score ?'[]'$  - Use when only an EandM is performed on FOLLOW-UP visit 0 ?ASSESSMENTS - Nursing Assessment / Reassessment ?'[]'$  - Reassessment of  Co-morbidities (includes updates in patient status) 0 ?X- 1 5 ?Reassessment of Adherence to Treatment Plan ?ASSESSMENTS - Wound and Skin Assessment / Reassessment ?'[]'$  - Simple Wound Assessment / Reassessment - one wound 0 ?X- 3 5 ?Complex Wound Assessment / Reassessment - multiple wounds ?'[]'$  - 0 ?Dermatologic / Skin Assessment (not related to wound area) ?ASSESSMENTS - Focused Assessment ?'[]'$  - Circumferential Edema Measurements - multi extremities 0 ?'[]'$  - 0 ?Nutritional Assessment / Counseling / Intervention ?'[]'$  - 0 ?Lower Extremity Assessment (monofilament, tuning fork, pulses) ?'[]'$  - 0 ?Peripheral Arterial Disease Assessment (using hand held doppler) ?ASSESSMENTS - Ostomy and/or Continence Assessment and Care ?'[]'$  - Incontinence Assessment and Management 0 ?'[]'$  - 0 ?Ostomy Care Assessment and Management (repouching, etc.) ?PROCESS - Coordination of Care ?X - Simple Patient / Family Education for ongoing care 1 15 ?'[]'$  - 0 ?Complex (extensive) Patient / Family Education for ongoing care ?'[]'$  - 0 ?Staff obtains Consents, Records, Test Results / Process Orders ?'[]'$  - 0 ?Staff telephones HHA, Nursing Homes / Clarify orders / etc ?'[]'$  - 0 ?Routine Transfer to another Facility (non-emergent condition) ?'[]'$  - 0 ?Routine Hospital Admission (non-emergent condition) ?'[]'$  - 0 ?New Admissions / Biomedical engineer / Ordering NPWT, Apligraf, etc. ?'[]'$  - 0 ?Emergency Hospital Admission (emergent condition) ?X- 1 10 ?Simple Discharge Coordination ?'[]'$  - 0 ?Complex (extensive) Discharge Coordination ?PROCESS - Special Needs ?'[]'$  - Pediatric / Minor Patient Management 0 ?'[]'$  - 0 ?Isolation Patient Management ?'[]'$  - 0 ?Hearing / Language / Visual special needs ?'[]'$  - 0 ?Assessment of Community assistance (transportation, D/C planning, etc.) ?'[]'$  - 0 ?Additional assistance / Altered mentation ?'[]'$  - 0 ?Support Surface(s) Assessment (bed, cushion, seat, etc.) ?INTERVENTIONS - Wound Cleansing / Measurement ?Laura Mccarty, Laura Mccarty ([id number]) ?'[]'$  -  0 ?Simple Wound Cleansing - one wound ?X- 3 5 ?Complex Wound Cleansing - multiple wounds ?X- 1 5 ?Wound Imaging (photographs - any number of wounds) ?'[]'$  - 0 ?Wound Tracing (instead of photographs) ?'[]'$  - 0 ?Simple Wound Measurement -  one wound ?X- 3 5 ?Complex Wound Measurement - multiple wounds ?INTERVENTIONS - Wound Dressings ?'[]'$  - Small Wound Dressing one or multiple wounds 0 ?X- 3 15 ?Medium Wound Dressing one or multiple wounds ?'[]'$  - 0 ?Large Wound Dressing one or multiple wounds ?X- 1 5 ?Application of Medications - topical ?'[]'$  - 0 ?Application of Medications - injection ?INTERVENTIONS - Miscellaneous ?'[]'$  - External ear exam 0 ?'[]'$  - 0 ?Specimen Collection (cultures, biopsies, blood, body fluids, etc.) ?'[]'$  - 0 ?Specimen(s) / Culture(s) sent or taken to Lab for analysis ?'[]'$  - 0 ?Patient Transfer (multiple staff / Civil Service fast streamer / Similar devices) ?'[]'$  - 0 ?Simple Staple / Suture removal (25 or less) ?'[]'$  - 0 ?Complex Staple / Suture removal (26 or more) ?'[]'$  - 0 ?Hypo / Hyperglycemic Management (close monitor of Blood Glucose) ?'[]'$  - 0 ?Ankle / Brachial Index (ABI) - do not check if billed separately ?X- 1 5 ?Vital Signs ?Has the patient been seen at the hospital within the last three years: Yes ?Total Score: 135 ?Level Of Care: New/Established - Level ?4 ?Electronic Signature(s) ?Signed: 04/26/2021 3:41:20 PM By: Levora Dredge ?Entered By: Levora Dredge on 04/25/2021 09:45:55 ?Laura Mccarty, Laura Mccarty ([id number]) ?-------------------------------------------------------------------------------- ?Encounter Discharge Information Details ?Patient Name: Laura Mccarty, Laura Mccarty ?Date of Service: 04/25/2021 9:00 AM ?Medical Record Number: [id number] ?Patient Account Number: [id number] ?Date of Birth/Sex: 1955-06-20 (66 y.o. F) ?Treating RN: Levora Dredge ?Primary Care Nahal Wanless: Dillard Cannon Other Clinician: ?Referring Bobbe Quilter: Dillard Cannon ?Treating Myisha Pickerel/Extender: Kalman Shan ?Weeks in Treatment: 1 ?Encounter Discharge  Information Items ?Discharge Condition: Stable ?Ambulatory Status: Wheelchair ?Discharge Destination: Home ?Transportation: Private Auto ?Accompanied By: husband ?Schedule Follow-up Appointment: Yes ?Clinical Summary of Care: ?Electronic Signature(s) ?Signed: 04/25/2021 10:32:22 AM By: Levora Dredge ?Entered By: Levora Dredge on 04/25/2021 10:32:22 ?Laura Mccarty, Laura Mccarty ([id number]) ?-------------------------------------------------------------------------------- ?Lower Extremity Assessment Details ?Patient Name: Laura Mccarty, Laura Mccarty ?Date of Service: 04/25/2021 9:00 AM ?Medical Record Number: [id number] ?Patient Account Number: [id number] ?Date of Birth/Sex: 11/18/1955 (65 y.o. F) ?Treating RN: Levora Dredge ?Primary Care Nasser Ku: Dillard Cannon Other Clinician: ?Referring Nirel Babler: Dillard Cannon ?Treating Geena Weinhold/Extender: Kalman Shan ?Weeks in Treatment: 1 ?Edema Assessment ?Assessed: [Left: No] [Right: No] ?Edema: [Left: Yes] [Right: Yes] ?Calf ?Left: Right: ?Point of Measurement: 34 cm From Medial Instep 70 cm 70 cm ?Ankle ?Left: Right: ?Point of Measurement: 10 cm From Medial Instep 37.5 cm 40 cm ?Electronic Signature(s) ?Signed: 04/26/2021 3:41:20 PM By: Levora Dredge ?Entered By: Levora Dredge on 04/25/2021 09:32:13 ?Laura Mccarty, Laura Mccarty ([id number]) ?-------------------------------------------------------------------------------- ?Multi Wound Chart Details ?Patient Name: Laura Mccarty, Laura Mccarty ?Date of Service: 04/25/2021 9:00 AM ?Medical Record Number: [id number] ?Patient Account Number: [id number] ?Date of Birth/Sex: 1955-07-26 (66 y.o. F) ?Treating RN: Levora Dredge ?Primary Care Shalia Bartko: Dillard Cannon Other Clinician: ?Referring Karinna Beadles: Dillard Cannon ?Treating Kenitra Leventhal/Extender: Kalman Shan ?Weeks in Treatment: 1 ?Vital Signs ?Height(in): 64 ?Pulse(bpm): 60 ?Weight(lbs): 360 ?Blood Pressure(mmHg): 156/63 ?Body Mass Index(BMI): 61.8 ?Temperature(??F): 97.5 ?Respiratory Rate(breaths/min):  18 ?Photos: ?Wound Location: Right Foot Left Toe Third Left Toe Fifth ?Wounding Event: Gradually Appeared Gradually Appeared Gradually Appeared ?Primary Etiology: Diabetic Wound/Ulcer of the Lower Diabetic Wound/Ulcer of the Lower Diab

## 2021-05-02 ENCOUNTER — Ambulatory Visit: Payer: Medicare HMO | Admitting: Physician Assistant

## 2021-05-03 ENCOUNTER — Encounter (HOSPITAL_BASED_OUTPATIENT_CLINIC_OR_DEPARTMENT_OTHER): Payer: Medicare HMO | Admitting: Internal Medicine

## 2021-05-03 ENCOUNTER — Other Ambulatory Visit: Payer: Self-pay

## 2021-05-03 DIAGNOSIS — L97512 Non-pressure chronic ulcer of other part of right foot with fat layer exposed: Secondary | ICD-10-CM | POA: Diagnosis not present

## 2021-05-03 DIAGNOSIS — E11621 Type 2 diabetes mellitus with foot ulcer: Secondary | ICD-10-CM | POA: Diagnosis not present

## 2021-05-03 DIAGNOSIS — L97522 Non-pressure chronic ulcer of other part of left foot with fat layer exposed: Secondary | ICD-10-CM

## 2021-05-03 DIAGNOSIS — I890 Lymphedema, not elsewhere classified: Secondary | ICD-10-CM

## 2021-05-03 NOTE — Progress Notes (Signed)
Laura, Mccarty ([id number]) ?Visit Report for 05/03/2021 ?Chief Complaint Document Details ?Patient Name: Laura Mccarty, Laura Mccarty ?Date of Service: 05/03/2021 2:30 PM ?Medical Record Number: [id number] ?Patient Account Number: [id number] ?Date of Birth/Sex: 12-13-1955 (66 y.o. F) ?Treating RN: Donnamarie Poag ?Primary Care Provider: Dillard Cannon Other Clinician: ?Referring Provider: Dillard Cannon ?Treating Provider/Extender: Kalman Shan ?Weeks in Treatment: 2 ?Information Obtained from: Patient ?Chief Complaint ?Left foot ulcer ?Electronic Signature(s) ?Signed: 05/03/2021 3:11:57 PM By: Kalman Shan DO ?Entered By: Kalman Shan on 05/03/2021 14:44:11 ?MALYNDA, SMOLINSKI ([id number]) ?-------------------------------------------------------------------------------- ?HPI Details ?Patient Name: Laura, Mccarty ?Date of Service: 05/03/2021 2:30 PM ?Medical Record Number: [id number] ?Patient Account Number: [id number] ?Date of Birth/Sex: December 25, 1955 (66 y.o. F) ?Treating RN: Donnamarie Poag ?Primary Care Provider: Dillard Cannon Other Clinician: ?Referring Provider: Dillard Cannon ?Treating Provider/Extender: Kalman Shan ?Weeks in Treatment: 2 ?History of Present Illness ?HPI Description: 04/18/2021 upon evaluation today patient presents for initial inspection here in the clinic regarding issues that she has been ?having with her bilateral lower extremities and feet. The right plantar foot ulcer she has had for about a year she tells me. With that being said ?the left plantar foot ulcer which is actually more on the toe of the third toe and fifth toes are much more new in fact she was not aware that was ?there until we saw them today. Fortunately I do not see any evidence of active infection locally or systemically which is great news. No fever chills ?noted the patient does have several chronic major medical problems that do contribute to the issue going on at hand. ?Patient does have a history of diabetes mellitus type 2,  lymphedema, hypertension, congestive heart failure, and chronic kidney disease stage III. ?She does have lymphedema pumps though she tells me she is not really able to use them due to how much the bother and hurt her. ?3/14; patient presents for follow-up. She has home health who comes out twice weekly to help with dressing changes. She has no issues or ?complaints today. She denies signs of infection. ?3/22; patient presents for follow-up. She reports increased weeping to the left heel. She has lymphedema pumps but is currently not using them. ?She is scheduled to have ABIs on 3/29. She currently denies signs of infection. ?Electronic Signature(s) ?Signed: 05/03/2021 3:11:57 PM By: Kalman Shan DO ?Entered By: Kalman Shan on 05/03/2021 14:45:27 ?GENINE, BECKETT ([id number]) ?-------------------------------------------------------------------------------- ?Physical Exam Details ?Patient Name: Laura, Mccarty ?Date of Service: 05/03/2021 2:30 PM ?Medical Record Number: [id number] ?Patient Account Number: [id number] ?Date of Birth/Sex: 12/28/1955 (66 y.o. F) ?Treating RN: Donnamarie Poag ?Primary Care Provider: Dillard Cannon Other Clinician: ?Referring Provider: Dillard Cannon ?Treating Provider/Extender: Kalman Shan ?Weeks in Treatment: 2 ?Constitutional ?. ?Cardiovascular ?Marland Kitchen ?Psychiatric ?Marland Kitchen ?Notes ?Right foot: To the plantar aspect there is an open wound with granulation tissue and scant nonviable tissue. No surrounding signs of infection. He ?is left foot: To the third toe there is an open wound with granulation tissue present. To the fifth toe there is a scab over the previous wound site. ?There is an open wound to the left calcaneus limited to skin breakdown and weeping. No surrounding signs of infection. ?Electronic Signature(s) ?Signed: 05/03/2021 3:11:57 PM By: Kalman Shan DO ?Entered By: Kalman Shan on 05/03/2021 14:46:41 ?GENEVRA, ORNE  ([id number]) ?-------------------------------------------------------------------------------- ?Physician Orders Details ?Patient Name: Laura, Mccarty ?Date of Service: 05/03/2021 2:30 PM ?Medical Record Number: [id number] ?Patient Account Number: [id number] ?Date of Birth/Sex: 12-Oct-1955 (66 y.o. F) ?Treating RN: Donnamarie Poag ?Primary Care Provider: Dillard Cannon Other Clinician: ?Referring Provider: Hallquist,  Vinnie Level ?Treating Provider/Extender: Kalman Shan ?Weeks in Treatment: 2 ?Verbal / Phone Orders: No ?Diagnosis Coding ?Follow-up Appointments ?o Return Appointment in 1 week. ?o Nurse Visit as needed ?Home Health ?[place] to use lymphedema pumps ?o [place] for wound care. May utilize formulary equivalent dressing for wound treatment orders unless ?otherwise specified. Home Health Nurse may visit PRN to address patientos wound care needs. - 2 x week by home health and ?once weekly at wound clinic ?o Scheduled days for dressing changes to be completed; exception, patient has scheduled wound care visit that day. ?o **Please direct any NON-WOUND related issues/requests for orders to patient's Primary Care Physician. **If current ?dressing causes regression in wound condition, may D/C ordered dressing product/s and apply Normal Saline Moist ?Dressing daily until next [place] or Other MD appointment. **Notify [facility] of regression in ?wound condition at [phone]. ?Bathing/ Shower/ Hygiene ?o May shower; gently cleanse wound with antibacterial soap, rinse and pat dry prior to dressing wounds - or keep dressings dry ?if not changed after shower ?o May shower with wound dressing protected with water repellent cover or cast protector. ?o No tub bath. ?Anesthetic (Use 'Patient Medications' Section for Anesthetic Order Entry) ?o Lidocaine applied to wound bed ?Edema Control - Lymphedema / Segmental Compressive Device /  Other ?o Elevate, Exercise Daily and Avoid Standing for Long Periods of Time. ?o Elevate leg(s) parallel to the floor when sitting. ?o Compression Pump: Use compression pump on left lower extremity for 60 minutes, twice daily. ?o Compression Pump: Use compression pump on right lower extremity for 60 minutes, twice daily. ?o DO YOUR BEST to sleep in the bed at night. DO NOT sleep in your recliner. Long hours of sitting in a recliner leads to ?swelling of the legs and/or potential wounds on your backside. ?o Other: - Keep vascular appt on 05/10/21 ?Additional Orders / Instructions ?o Follow Nutritious Diet and Increase Protein Intake ?o Other: - Take diuretic as prescribed ?Wound Treatment ?Wound #1 - Foot Wound Laterality: Plantar, Right ?Cleanser: Normal Saline 3 x Per Week/30 Days ?Discharge Instructions: Wash your hands with soap and water. Remove old dressing, discard into plastic bag and place into trash. ?Cleanse the wound with Normal Saline prior to applying a clean dressing using gauze sponges, not tissues or cotton balls. Do not scrub ?or use excessive force. Pat dry using gauze sponges, not tissue or cotton balls. ?Primary Dressing: Silvercel 4 1/4x 4 1/4 (in/in) 3 x Per Week/30 Days ?Discharge Instructions: Apply to wound areas ?Secondary Dressing: ABD Pad 5x9 (in/in) 3 x Per Week/30 Days ?Discharge Instructions: Applied over silver cell and placed on areas of weeping ?Secondary Dressing: Kerlix 4.5 x 4.1 (in/yd) 3 x Per Week/30 Days ?Discharge Instructions: Apply Kerlix 4.5 x 4.1 (in/yd) as instructed ?Secured With: ACE WRAP - 58M ACE Elastic Bandage With VELCRO Brand Closure, 4 (in) 3 x Per Week/30 Days ?Wound #2 - Toe Third Wound Laterality: Left ?Cleanser: Normal Saline 3 x Per Week/30 Days ?DESARAI, BARRACK ([id number]) ?Discharge Instructions: Wash your hands with soap and water. Remove old dressing, discard into plastic bag and place into trash. ?Cleanse the wound with Normal Saline prior  to applying a clean dressing using gauze sponges, not tissues or cotton balls. Do not scrub ?or use excessive force. Pat dry using gauze sponges, not tissue or cotton balls. ?Primary Dressing: Silvercel 4 1/4x 4 1/4 (in/in) 3 x Per Week/30 Days ?Discharge Ins

## 2021-05-03 NOTE — Progress Notes (Signed)
JENESE, MISCHKE ([id number]) ?Visit Report for 05/03/2021 ?Arrival Information Details ?Patient Name: LATALIA, ETZLER ?Date of Service: 05/03/2021 2:30 PM ?Medical Record Number: [id number] ?Patient Account Number: [id number] ?Date of Birth/Sex: [birth year] (66 y.o. F) ?Treating RN: Donnamarie Poag ?Primary Care Heliodoro Domagalski: Dillard Cannon Other Clinician: ?Referring Delshawn Stech: Dillard Cannon ?Treating Perlie Scheuring/Extender: Kalman Shan ?Weeks in Treatment: 2 ?Visit Information History Since Last Visit ?Added or deleted any medications: No ?Patient Arrived: Wheel Chair ?Had a fall or experienced change in No ?Arrival Time: 14:08 ?activities of daily living that may affect ?Accompanied By: husband ?risk of falls: ?Transfer Assistance: Manual ?Hospitalized since last visit: No ?Patient Identification Verified: Yes ?Has Dressing in Place as Prescribed: Yes ?Secondary Verification Process Completed: Yes ?Has Compression in Place as Prescribed: Yes ?Patient Requires Transmission-Based No ?Pain Present Now: No ?Precautions: ?Patient Has Alerts: Yes ?Patient Alerts: Patient on Blood ?Thinner ?type 2 diabetic ?partially blind ?Electronic Signature(s) ?Signed: 05/03/2021 4:03:21 PM By: Donnamarie Poag ?Entered ByDonnamarie Poag on 05/03/2021 14:11:04 ?NICOLA, HEINEMANN ([id number]) ?-------------------------------------------------------------------------------- ?Clinic Level of Care Assessment Details ?Patient Name: GELISA, TIEKEN ?Date of Service: 05/03/2021 2:30 PM ?Medical Record Number: [id number] ?Patient Account Number: [id number] ?Date of Birth/Sex: 30-Oct-1955 (66 y.o. F) ?Treating RN: Donnamarie Poag ?Primary Care Lesette Frary: Dillard Cannon Other Clinician: ?Referring Mashal Slavick: Dillard Cannon ?Treating Saranne Crislip/Extender: Kalman Shan ?Weeks in Treatment: 2 ?Clinic Level of Care Assessment Items ?TOOL 4 Quantity Score ?'[]'$  - Use when only an EandM is performed on FOLLOW-UP visit 0 ?ASSESSMENTS - Nursing Assessment / Reassessment ?'[]'$  -  Reassessment of Co-morbidities (includes updates in patient status) 0 ?'[]'$  - 0 ?Reassessment of Adherence to Treatment Plan ?ASSESSMENTS - Wound and Skin Assessment / Reassessment ?'[]'$  - Simple Wound Assessment / Reassessment - one wound 0 ?X- 4 5 ?Complex Wound Assessment / Reassessment - multiple wounds ?'[]'$  - 0 ?Dermatologic / Skin Assessment (not related to wound area) ?ASSESSMENTS - Focused Assessment ?'[]'$  - Circumferential Edema Measurements - multi extremities 0 ?'[]'$  - 0 ?Nutritional Assessment / Counseling / Intervention ?'[]'$  - 0 ?Lower Extremity Assessment (monofilament, tuning fork, pulses) ?'[]'$  - 0 ?Peripheral Arterial Disease Assessment (using hand held doppler) ?ASSESSMENTS - Ostomy and/or Continence Assessment and Care ?'[]'$  - Incontinence Assessment and Management 0 ?'[]'$  - 0 ?Ostomy Care Assessment and Management (repouching, etc.) ?PROCESS - Coordination of Care ?X - Simple Patient / Family Education for ongoing care 1 15 ?'[]'$  - 0 ?Complex (extensive) Patient / Family Education for ongoing care ?X- 1 10 ?Staff obtains Consents, Records, Test Results / Process Orders ?X- 1 10 ?Staff telephones HHA, Nursing Homes / Clarify orders / etc ?'[]'$  - 0 ?Routine Transfer to another Facility (non-emergent condition) ?'[]'$  - 0 ?Routine Hospital Admission (non-emergent condition) ?'[]'$  - 0 ?New Admissions / Biomedical engineer / Ordering NPWT, Apligraf, etc. ?'[]'$  - 0 ?Emergency Hospital Admission (emergent condition) ?X- 1 10 ?Simple Discharge Coordination ?'[]'$  - 0 ?Complex (extensive) Discharge Coordination ?PROCESS - Special Needs ?'[]'$  - Pediatric / Minor Patient Management 0 ?'[]'$  - 0 ?Isolation Patient Management ?'[]'$  - 0 ?Hearing / Language / Visual special needs ?'[]'$  - 0 ?Assessment of Community assistance (transportation, D/C planning, etc.) ?'[]'$  - 0 ?Additional assistance / Altered mentation ?'[]'$  - 0 ?Support Surface(s) Assessment (bed, cushion, seat, etc.) ?INTERVENTIONS - Wound Cleansing / Measurement ?LIVY, ROSS  ([id number]) ?'[]'$  - 0 ?Simple Wound Cleansing - one wound ?X- 4 5 ?Complex Wound Cleansing - multiple wounds ?X- 1 5 ?Wound Imaging (photographs - any number of wounds) ?'[]'$  - 0 ?Wound Tracing (instead of photographs) ?'[]'$  - 0 ?  Simple Wound Measurement - one wound ?X- 4 5 ?Complex Wound Measurement - multiple wounds ?INTERVENTIONS - Wound Dressings ?X - Small Wound Dressing one or multiple wounds 4 10 ?'[]'$  - 0 ?Medium Wound Dressing one or multiple wounds ?'[]'$  - 0 ?Large Wound Dressing one or multiple wounds ?X- 1 5 ?Application of Medications - topical ?'[]'$  - 0 ?Application of Medications - injection ?INTERVENTIONS - Miscellaneous ?'[]'$  - External ear exam 0 ?'[]'$  - 0 ?Specimen Collection (cultures, biopsies, blood, body fluids, etc.) ?'[]'$  - 0 ?Specimen(s) / Culture(s) sent or taken to Lab for analysis ?'[]'$  - 0 ?Patient Transfer (multiple staff / Civil Service fast streamer / Similar devices) ?'[]'$  - 0 ?Simple Staple / Suture removal (25 or less) ?'[]'$  - 0 ?Complex Staple / Suture removal (26 or more) ?'[]'$  - 0 ?Hypo / Hyperglycemic Management (close monitor of Blood Glucose) ?'[]'$  - 0 ?Ankle / Brachial Index (ABI) - do not check if billed separately ?X- 1 5 ?Vital Signs ?Has the patient been seen at the hospital within the last three years: Yes ?Total Score: 160 ?Level Of Care: New/Established - Level ?5 ?Electronic Signature(s) ?Signed: 05/03/2021 4:03:21 PM By: Donnamarie Poag ?Entered ByDonnamarie Poag on 05/03/2021 14:44:37 ?ODELLE, KOSIER ([id number]) ?-------------------------------------------------------------------------------- ?Encounter Discharge Information Details ?Patient Name: SHEY, YOTT ?Date of Service: 05/03/2021 2:30 PM ?Medical Record Number: [id number] ?Patient Account Number: [id number] ?Date of Birth/Sex: Mar 19, 1955 (66 y.o. F) ?Treating RN: Donnamarie Poag ?Primary Care Lathen Seal: Dillard Cannon Other Clinician: ?Referring Intisar Claudio: Dillard Cannon ?Treating Spyridon Hornstein/Extender: Kalman Shan ?Weeks in Treatment: 2 ?Encounter  Discharge Information Items ?Discharge Condition: Stable ?Ambulatory Status: Wheelchair ?Discharge Destination: Home Health ?Telephoned: No ?Orders Sent: Yes ?Transportation: Private Auto ?Accompanied By: husband ?Schedule Follow-up Appointment: Yes ?Clinical Summary of Care: ?Electronic Signature(s) ?Signed: 05/03/2021 4:03:21 PM By: Donnamarie Poag ?Entered ByDonnamarie Poag on 05/03/2021 14:58:59 ?AZILE, MINARDI ([id number]) ?-------------------------------------------------------------------------------- ?Lower Extremity Assessment Details ?Patient Name: JANAYSIA, MCLEROY ?Date of Service: 05/03/2021 2:30 PM ?Medical Record Number: [id number] ?Patient Account Number: [id number] ?Date of Birth/Sex: 1955-03-25 (66 y.o. F) ?Treating RN: Donnamarie Poag ?Primary Care Evangelos Paulino: Dillard Cannon Other Clinician: ?Referring Kinney Sackmann: Dillard Cannon ?Treating Daliyah Sramek/Extender: Kalman Shan ?Weeks in Treatment: 2 ?Edema Assessment ?Assessed: [Left: Yes] [Right: Yes] ?Edema: [Left: Yes] [Right: Yes] ?Calf ?Left: Right: ?Point of Measurement: 34 cm From Medial Instep 70 cm 70 cm ?Ankle ?Left: Right: ?Point of Measurement: 10 cm From Medial Instep 40.5 cm 39.5 cm ?Vascular Assessment ?Pulses: ?Dorsalis Pedis ?Palpable: [Left:No] [Right:No] ?Notes ?unable to assess pulses with extreme bilateral pedal edema; [facility] appt 05/10/21 ?Electronic Signature(s) ?Signed: 05/03/2021 4:03:21 PM By: Donnamarie Poag ?Entered ByDonnamarie Poag on 05/03/2021 14:31:02 ?MARISSIA, BLACKHAM ([id number]) ?-------------------------------------------------------------------------------- ?Multi Wound Chart Details ?Patient Name: HINDA, LINDOR ?Date of Service: 05/03/2021 2:30 PM ?Medical Record Number: [id number] ?Patient Account Number: [id number] ?Date of Birth/Sex: February 09, 1956 (66 y.o. F) ?Treating RN: Donnamarie Poag ?Primary Care Deuce Paternoster: Dillard Cannon Other Clinician: ?Referring Soila Printup: Dillard Cannon ?Treating Brockton Mckesson/Extender: Kalman Shan ?Weeks in  Treatment: 2 ?Vital Signs ?Height(in): 64 ?Pulse(bpm): 79 ?Weight(lbs): 360 ?Blood Pressure(mmHg): 198/98 ?Body Mass Index(BMI): 61.8 ?Temperature(??F): 98.1 ?Respiratory Rate(breaths/min): 22 ?Photos: ?Wound Location: Right,

## 2021-05-08 ENCOUNTER — Other Ambulatory Visit
Admission: RE | Admit: 2021-05-08 | Discharge: 2021-05-08 | Disposition: A | Discharge status: Home / Self Care | Payer: Medicare HMO | Source: Ambulatory Visit | Attending: Physician Assistant | Admitting: Physician Assistant

## 2021-05-08 ENCOUNTER — Encounter: Payer: Medicare HMO | Admitting: Physician Assistant

## 2021-05-08 ENCOUNTER — Other Ambulatory Visit: Payer: Self-pay

## 2021-05-08 DIAGNOSIS — B999 Unspecified infectious disease: Secondary | ICD-10-CM | POA: Diagnosis present

## 2021-05-08 DIAGNOSIS — E11621 Type 2 diabetes mellitus with foot ulcer: Secondary | ICD-10-CM | POA: Diagnosis not present

## 2021-05-08 NOTE — Progress Notes (Addendum)
IDALI, LAFEVER ([id number]) ?Visit Report for 05/08/2021 ?Chief Complaint Document Details ?Patient Name: Laura Mccarty, Laura Mccarty ?Date of Service: 05/08/2021 10:30 AM ?Medical Record Number: [id number] ?Patient Account Number: [id number] ?Date of Birth/Sex: 1956/01/18 (66 y.o. F) ?Treating RN: Levora Dredge ?Primary Care Provider: Dillard Cannon Other Clinician: ?Referring Provider: Dillard Cannon ?Treating Provider/Extender: Jeri Cos ?Weeks in Treatment: 2 ?Information Obtained from: Patient ?Chief Complaint ?Left foot ulcer ?Electronic Signature(s) ?Signed: 05/08/2021 10:32:09 AM By: Worthy Keeler PA-C ?Entered By: Worthy Keeler on 05/08/2021 10:32:09 ?JAYONA, MCCAIG ([id number]) ?-------------------------------------------------------------------------------- ?Debridement Details ?Patient Name: Laura, Mccarty ?Date of Service: 05/08/2021 10:30 AM ?Medical Record Number: [id number] ?Patient Account Number: [id number] ?Date of Birth/Sex: 24-Mar-1955 (66 y.o. F) ?Treating RN: Levora Dredge ?Primary Care Provider: Dillard Cannon Other Clinician: ?Referring Provider: Dillard Cannon ?Treating Provider/Extender: Jeri Cos ?Weeks in Treatment: 2 ?Debridement Performed for ?Wound #1 Right,Plantar Foot ?Assessment: ?Performed By: Physician Tommie Sams., PA-C ?Debridement Type: Debridement ?Severity of Tissue Pre Debridement: Fat layer exposed ?Level of Consciousness (Pre- ?Awake and Alert ?procedure): ?Pre-procedure Verification/Time Out ?Yes - 11:23 ?Taken: ?Total Area Debrided (L x W): 2 (cm) x 1 (cm) = 2 (cm?) ?Tissue and other material ?Non-Viable, Callus ?debrided: ?Level: Non-Viable Tissue ?Debridement Description: Selective/Open Wound ?Instrument: Curette ?Bleeding: Moderate ?Hemostasis Achieved: Pressure ?Response to Treatment: Procedure was tolerated well ?Level of Consciousness (Post- ?Awake and Alert ?procedure): ?Post Debridement Measurements of Total Wound ?Length: (cm) 2 ?Width: (cm) 1 ?Depth: (cm)  0.4 ?Volume: (cm?) 0.628 ?Character of Wound/Ulcer Post Debridement: Stable ?Severity of Tissue Post Debridement: Fat layer exposed ?Post Procedure Diagnosis ?Same as Pre-procedure ?Electronic Signature(s) ?Signed: 05/08/2021 3:21:13 PM By: Levora Dredge ?Signed: 05/08/2021 3:51:56 PM By: Worthy Keeler PA-C ?Entered By: Levora Dredge on 05/08/2021 11:30:25 ?LAKEYSHIA, TUCKERMAN ([id number]) ?-------------------------------------------------------------------------------- ?HPI Details ?Patient Name: Laura, Mccarty ?Date of Service: 05/08/2021 10:30 AM ?Medical Record Number: [id number] ?Patient Account Number: [id number] ?Date of Birth/Sex: 1955-11-15 (66 y.o. F) ?Treating RN: Levora Dredge ?Primary Care Provider: Dillard Cannon Other Clinician: ?Referring Provider: Dillard Cannon ?Treating Provider/Extender: Jeri Cos ?Weeks in Treatment: 2 ?History of Present Illness ?HPI Description: 04/18/2021 upon evaluation today patient presents for initial inspection here in the clinic regarding issues that she has been ?having with her bilateral lower extremities and feet. The right plantar foot ulcer she has had for about a year she tells me. With that being said ?the left plantar foot ulcer which is actually more on the toe of the third toe and fifth toes are much more new in fact she was not aware that was ?there until we saw them today. Fortunately I do not see any evidence of active infection locally or systemically which is great news. No fever chills ?noted the patient does have several chronic major medical problems that do contribute to the issue going on at hand. ?Patient does have a history of diabetes mellitus type 2, lymphedema, hypertension, congestive heart failure, and chronic kidney disease stage III. ?She does have lymphedema pumps though she tells me she is not really able to use them due to how much the bother and hurt her. ?3/14; patient presents for follow-up. She has home health who comes out twice  weekly to help with dressing changes. She has no issues or ?complaints today. She denies signs of infection. ?3/22; patient presents for follow-up. She reports increased weeping to the left heel. She has lymphedema pumps but is currently not using them. ?She is scheduled to have ABIs on 3/29. She currently denies signs of infection. ?05/08/2021 upon evaluation patient appears  to be doing worse in regard to her right foot ulcer. The other wounds on her left foot and heel all ?seem to have closed she still has some weeping on the left heel region but this is not an open wound at this point. Fortunately I do not see any ?signs of infection here. On the right foot this is a different story I think we will get a probably need to see about looking into a culture today and ?then subsequently were also going to look into getting her started on an antibiotic as well. ?Electronic Signature(s) ?Signed: 05/08/2021 3:39:33 PM By: Worthy Keeler PA-C ?Entered By: Worthy Keeler on 05/08/2021 15:39:33 ?RAHI, CHANDONNET ([id number]) ?-------------------------------------------------------------------------------- ?Physical Exam Details ?Patient Name: Laura, Mccarty ?Date of Service: 05/08/2021 10:30 AM ?Medical Record Number: [id number] ?Patient Account Number: [id number] ?Date of Birth/Sex: Nov 26, 1955 (66 y.o. F) ?Treating RN: Levora Dredge ?Primary Care Provider: Dillard Cannon Other Clinician: ?Referring Provider: Dillard Cannon ?Treating Provider/Extender: Jeri Cos ?Weeks in Treatment: 2 ?Constitutional ?Obese and well-hydrated in no acute distress. ?Respiratory ?normal breathing without difficulty. ?Psychiatric ?this patient is able to make decisions and demonstrates good insight into disease process. Alert and Oriented x 3. pleasant and cooperative. ?Notes ?Upon inspection patient's wound bed did require some sharp debridement lightly just to remove some of the callus which was trapping fluid ?underneath. She tolerated  that debridement today without any pain and minimal bleeding noted obviously. I did not want to be too aggressive as ?we are still awaiting the arterial study which is forthcoming. ?Electronic Signature(s) ?Signed: 05/08/2021 3:39:59 PM By: Worthy Keeler PA-C ?Entered By: Worthy Keeler on 05/08/2021 15:39:59 ?EZME, DUCH ([id number]) ?-------------------------------------------------------------------------------- ?Physician Orders Details ?Patient Name: DOSSIE, OCANAS ?Date of Service: 05/08/2021 10:30 AM ?Medical Record Number: [id number] ?Patient Account Number: [id number] ?Date of Birth/Sex: Feb 21, 1955 (66 y.o. F) ?Treating RN: Levora Dredge ?Primary Care Provider: Dillard Cannon Other Clinician: ?Referring Provider: Dillard Cannon ?Treating Provider/Extender: Jeri Cos ?Weeks in Treatment: 2 ?Verbal / Phone Orders: No ?Diagnosis Coding ?ICD-10 Coding ?Code Description ?L97.512 Non-pressure chronic ulcer of other part of right foot with fat layer exposed ?O67.124 Non-pressure chronic ulcer of other part of left foot with fat layer exposed ?E11.621 Type 2 diabetes mellitus with foot ulcer ?I89.0 Lymphedema, not elsewhere classified ?I10 Essential (primary) hypertension ?I50.42 Chronic combined systolic (congestive) and diastolic (congestive) heart failure ?N18.30 Chronic kidney disease, stage 3 unspecified ?Follow-up Appointments ?o Return Appointment in 1 week. ?o Nurse Visit as needed ?Home Health ?[place] to use lymphedema pumps ?o [facility] for wound care. May utilize formulary equivalent dressing for wound treatment orders unless ?otherwise specified. Home Health Nurse may visit PRN to address patientos wound care needs. - 2 x week by home health and ?once weekly at wound clinic ?o Scheduled days for dressing changes to be completed; exception, patient has scheduled wound care visit that day. ?o **Please direct any NON-WOUND related  issues/requests for orders to patient's Primary Care Physician. **If current ?dressing causes regression in wound condition, may D/C ordered dressing product/s and apply Normal Saline Moist ?Dressing daily until next

## 2021-05-08 NOTE — Progress Notes (Signed)
Laura Mccarty, Laura Mccarty ([id number]) ?Visit Report for 05/08/2021 ?Arrival Information Details ?Patient Name: Laura Mccarty, Laura Mccarty ?Date of Service: 05/08/2021 10:30 AM ?Medical Record Number: [id number] ?Patient Account Number: [id number] ?Date of Birth/Sex: 08/25/1955 (67 y.o. F) ?Treating RN: Levora Dredge ?Primary Care Demarkis Gheen: Dillard Cannon Other Clinician: ?Referring Kamoria Lucien: Dillard Cannon ?Treating Kendall Justo/Extender: Jeri Cos ?Weeks in Treatment: 2 ?Visit Information History Since Last Visit ?Added or deleted any medications: No ?Patient Arrived: Wheel Chair ?Any new allergies or adverse reactions: No ?Arrival Time: 10:31 ?Had a fall or experienced change in No ?Accompanied By: husband ?activities of daily living that may affect ?Transfer Assistance: EasyPivot Patient Lift ?risk of falls: ?Patient Identification Verified: Yes ?Hospitalized since last visit: No ?Secondary Verification Process Completed: Yes ?Has Dressing in Place as Prescribed: Yes ?Patient Requires Transmission-Based No ?Has Compression in Place as Prescribed: Yes ?Precautions: ?Pain Present Now: No ?Patient Has Alerts: Yes ?Patient Alerts: Patient on Blood ?Thinner ?type 2 diabetic ?partially blind ?Electronic Signature(s) ?Signed: 05/08/2021 3:21:13 PM By: Levora Dredge ?Entered By: Levora Dredge on 05/08/2021 10:31:49 ?Laura Mccarty, Laura Mccarty ([id number]) ?-------------------------------------------------------------------------------- ?Clinic Level of Care Assessment Details ?Patient Name: Laura Mccarty ?Date of Service: 05/08/2021 10:30 AM ?Medical Record Number: [id number] ?Patient Account Number: [id number] ?Date of Birth/Sex: 01/14/1956 (65 y.o. F) ?Treating RN: Levora Dredge ?Primary Care Langley Flatley: Dillard Cannon Other Clinician: ?Referring Shaya Altamura: Dillard Cannon ?Treating Rosalind Guido/Extender: Jeri Cos ?Weeks in Treatment: 2 ?Clinic Level of Care Assessment Items ?TOOL 1 Quantity Score ?'[]'$  - Use when EandM and Procedure is performed on  INITIAL visit 0 ?ASSESSMENTS - Nursing Assessment / Reassessment ?'[]'$  - General Physical Exam (combine w/ comprehensive assessment (listed just below) when performed on new ?0 ?pt. evals) ?'[]'$  - 0 ?Comprehensive Assessment (HX, ROS, Risk Assessments, Wounds Hx, etc.) ?ASSESSMENTS - Wound and Skin Assessment / Reassessment ?'[]'$  - Dermatologic / Skin Assessment (not related to wound area) 0 ?ASSESSMENTS - Ostomy and/or Continence Assessment and Care ?'[]'$  - Incontinence Assessment and Management 0 ?'[]'$  - 0 ?Ostomy Care Assessment and Management (repouching, etc.) ?PROCESS - Coordination of Care ?'[]'$  - Simple Patient / Family Education for ongoing care 0 ?'[]'$  - 0 ?Complex (extensive) Patient / Family Education for ongoing care ?'[]'$  - 0 ?Staff obtains Consents, Records, Test Results / Process Orders ?'[]'$  - 0 ?Staff telephones HHA, Nursing Homes / Clarify orders / etc ?'[]'$  - 0 ?Routine Transfer to another Facility (non-emergent condition) ?'[]'$  - 0 ?Routine Hospital Admission (non-emergent condition) ?'[]'$  - 0 ?New Admissions / Biomedical engineer / Ordering NPWT, Apligraf, etc. ?'[]'$  - 0 ?Emergency Hospital Admission (emergent condition) ?PROCESS - Special Needs ?'[]'$  - Pediatric / Minor Patient Management 0 ?'[]'$  - 0 ?Isolation Patient Management ?'[]'$  - 0 ?Hearing / Language / Visual special needs ?'[]'$  - 0 ?Assessment of Community assistance (transportation, D/C planning, etc.) ?'[]'$  - 0 ?Additional assistance / Altered mentation ?'[]'$  - 0 ?Support Surface(s) Assessment (bed, cushion, seat, etc.) ?INTERVENTIONS - Miscellaneous ?'[]'$  - External ear exam 0 ?'[]'$  - 0 ?Patient Transfer (multiple staff / Civil Service fast streamer / Similar devices) ?'[]'$  - 0 ?Simple Staple / Suture removal (25 or less) ?'[]'$  - 0 ?Complex Staple / Suture removal (26 or more) ?'[]'$  - 0 ?Hypo/Hyperglycemic Management (do not check if billed separately) ?'[]'$  - 0 ?Ankle / Brachial Index (ABI) - do not check if billed separately ?Has the patient been seen at the hospital within the last three  years: Yes ?Total Score: 0 ?Level Of Care: ____ ?Laura Mccarty, Laura Mccarty ([id number]) ?Electronic Signature(s) ?Signed: 05/08/2021 3:21:13 PM By: Levora Dredge ?Entered By: Levora Dredge on  05/08/2021 11:55:17 ?Laura Mccarty, Laura Mccarty ([id number]) ?-------------------------------------------------------------------------------- ?Encounter Discharge Information Details ?Patient Name: Laura Mccarty, Laura Mccarty ?Date of Service: 05/08/2021 10:30 AM ?Medical Record Number: [id number] ?Patient Account Number: [id number] ?Date of Birth/Sex: Jul 04, 1955 (66 y.o. F) ?Treating RN: Levora Dredge ?Primary Care Jeannine Pennisi: Dillard Cannon Other Clinician: ?Referring Daishia Fetterly: Dillard Cannon ?Treating Kiron Osmun/Extender: Jeri Cos ?Weeks in Treatment: 2 ?Encounter Discharge Information Items Post Procedure Vitals ?Discharge Condition: Stable ?Temperature (?F): 97.7 ?Ambulatory Status: Wheelchair ?Pulse (bpm): 80 ?Discharge Destination: Home ?Respiratory Rate (breaths/min): 18 ?Transportation: Private Auto ?Blood Pressure (mmHg): 158/77 ?Accompanied By: husband ?Schedule Follow-up Appointment: Yes ?Clinical Summary of Care: ?Electronic Signature(s) ?Signed: 05/08/2021 11:58:31 AM By: Levora Dredge ?Entered By: Levora Dredge on 05/08/2021 11:58:31 ?Laura Mccarty, Laura Mccarty ([id number]) ?-------------------------------------------------------------------------------- ?Lower Extremity Assessment Details ?Patient Name: Laura Mccarty, Laura Mccarty ?Date of Service: 05/08/2021 10:30 AM ?Medical Record Number: [id number] ?Patient Account Number: [id number] ?Date of Birth/Sex: 1955/10/10 (66 y.o. F) ?Treating RN: Levora Dredge ?Primary Care Antionetta Ator: Dillard Cannon Other Clinician: ?Referring Sefora Tietje: Dillard Cannon ?Treating Caral Whan/Extender: Jeri Cos ?Weeks in Treatment: 2 ?Edema Assessment ?Assessed: [Left: No] [Right: No] ?Edema: [Left: Yes] [Right: Yes] ?Calf ?Left: Right: ?Point of Measurement: 34 cm From Medial Instep 67 cm 68 cm ?Ankle ?Left: Right: ?Point of  Measurement: 10 cm From Medial Instep 38.5 cm 39.5 cm ?Electronic Signature(s) ?Signed: 05/08/2021 3:21:13 PM By: Levora Dredge ?Entered By: Levora Dredge on 05/08/2021 10:58:38 ?Laura Mccarty, Laura Mccarty ([id number]) ?-------------------------------------------------------------------------------- ?Multi Wound Chart Details ?Patient Name: Laura Mccarty, Laura Mccarty ?Date of Service: 05/08/2021 10:30 AM ?Medical Record Number: [id number] ?Patient Account Number: [id number] ?Date of Birth/Sex: [birth year] (66 y.o. F) ?Treating RN: Levora Dredge ?Primary Care Emelina Hinch: Dillard Cannon Other Clinician: ?Referring Anysha Frappier: Dillard Cannon ?Treating Festus Pursel/Extender: Jeri Cos ?Weeks in Treatment: 2 ?Vital Signs ?Height(in): 64 ?Pulse(bpm): 80 ?Weight(lbs): 360 ?Blood Pressure(mmHg): 158/77 ?Body Mass Index(BMI): 61.8 ?Temperature(??F): 97.7 ?Respiratory Rate(breaths/min): 18 ?Photos: ?Wound Location: Right, Plantar Foot Left Toe Third Left Toe Fifth ?Wounding Event: Gradually Appeared Gradually Appeared Gradually Appeared ?Primary Etiology: Diabetic Wound/Ulcer of the Lower Diabetic Wound/Ulcer of the Lower Diabetic Wound/Ulcer of the Lower ?Extremity Extremity Extremity ?Secondary Etiology: Lymphedema Lymphedema Lymphedema ?Comorbid History: Chronic sinus problems/congestion, Chronic sinus problems/congestion, Chronic sinus problems/congestion, ?Lymphedema, Congestive Heart Lymphedema, Congestive Heart Lymphedema, Congestive Heart ?Failure, Hypertension, Type II Failure, Hypertension, Type II Failure, Hypertension, Type II ?Diabetes, Neuropathy Diabetes, Neuropathy Diabetes, Neuropathy ?Date Acquired: 04/13/2020 04/18/2021 04/18/2021 ?Weeks of Treatment: '2 2 2 '$ ?Wound Status: Open Open Open ?Wound Recurrence: No No No ?Clustered Wound: No No No ?Measurements L x W x D (cm) 2x1x0.4 0.1x0.1x0.1 0.1x0.1x0.1 ?Area (cm?) : 1.571 0.008 0.008 ?Volume (cm?) : 0.628 0.001 0.001 ?% Reduction in Area: -100.10% 94.90% 88.70% ?% Reduction in Volume:  -166.10% 96.80% 85.70% ?Starting Position 1 (o'clock): 12 ?Ending Position 1 (o'clock): 12 ?Maximum Distance 1 (cm): 1.3 ?Undermining: Yes No No ?Classification: Grade 2 Grade 3 Grade 3 ?Exudate Amount: Medium Mediu

## 2021-05-09 ENCOUNTER — Ambulatory Visit: Payer: Medicare HMO | Admitting: Physician Assistant

## 2021-05-10 ENCOUNTER — Ambulatory Visit (INDEPENDENT_AMBULATORY_CARE_PROVIDER_SITE_OTHER): Payer: Medicare HMO

## 2021-05-10 ENCOUNTER — Other Ambulatory Visit: Payer: Self-pay

## 2021-05-10 DIAGNOSIS — L97509 Non-pressure chronic ulcer of other part of unspecified foot with unspecified severity: Secondary | ICD-10-CM | POA: Diagnosis not present

## 2021-05-10 DIAGNOSIS — L97512 Non-pressure chronic ulcer of other part of right foot with fat layer exposed: Secondary | ICD-10-CM

## 2021-05-10 DIAGNOSIS — L97122 Non-pressure chronic ulcer of left thigh with fat layer exposed: Secondary | ICD-10-CM | POA: Diagnosis not present

## 2021-05-10 DIAGNOSIS — E13621 Other specified diabetes mellitus with foot ulcer: Secondary | ICD-10-CM | POA: Diagnosis not present

## 2021-05-11 LAB — AEROBIC CULTURE W GRAM STAIN (SUPERFICIAL SPECIMEN)

## 2021-05-15 ENCOUNTER — Encounter: Payer: Medicare HMO | Attending: Physician Assistant | Admitting: Physician Assistant

## 2021-05-15 DIAGNOSIS — L97512 Non-pressure chronic ulcer of other part of right foot with fat layer exposed: Secondary | ICD-10-CM | POA: Insufficient documentation

## 2021-05-15 DIAGNOSIS — I130 Hypertensive heart and chronic kidney disease with heart failure and stage 1 through stage 4 chronic kidney disease, or unspecified chronic kidney disease: Secondary | ICD-10-CM | POA: Insufficient documentation

## 2021-05-15 DIAGNOSIS — I5042 Chronic combined systolic (congestive) and diastolic (congestive) heart failure: Secondary | ICD-10-CM | POA: Diagnosis not present

## 2021-05-15 DIAGNOSIS — E11621 Type 2 diabetes mellitus with foot ulcer: Secondary | ICD-10-CM | POA: Diagnosis not present

## 2021-05-15 DIAGNOSIS — L97522 Non-pressure chronic ulcer of other part of left foot with fat layer exposed: Secondary | ICD-10-CM | POA: Insufficient documentation

## 2021-05-15 DIAGNOSIS — N183 Chronic kidney disease, stage 3 unspecified: Secondary | ICD-10-CM | POA: Diagnosis not present

## 2021-05-15 DIAGNOSIS — I890 Lymphedema, not elsewhere classified: Secondary | ICD-10-CM | POA: Diagnosis not present

## 2021-05-15 NOTE — Progress Notes (Addendum)
LUMA, CLOPPER ([id number]) ?Visit Report for 05/15/2021 ?Arrival Information Details ?Patient Name: Laura Mccarty, Laura Mccarty ?Date of Service: 05/15/2021 2:15 PM ?Medical Record Number: [id number] ?Patient Account Number: [id number] ?Date of Birth/Sex: Sep 15, 1955 (66 y.o. F) ?Treating RN: Levora Dredge ?Primary Care Eulla Kochanowski: Dillard Cannon Other Clinician: ?Referring Kynleigh Artz: Dillard Cannon ?Treating Gara Kincade/Extender: Jeri Cos ?Weeks in Treatment: 3 ?Visit Information History Since Last Visit ?Added or deleted any medications: No ?Patient Arrived: Wheel Chair ?Any new allergies or adverse reactions: No ?Arrival Time: 14:09 ?Had a fall or experienced change in No ?Accompanied By: husband ?activities of daily living that may affect ?Transfer Assistance: EasyPivot Patient Lift ?risk of falls: ?Patient Identification Verified: Yes ?Hospitalized since last visit: No ?Secondary Verification Process Completed: Yes ?Has Dressing in Place as Prescribed: Yes ?Patient Requires Transmission-Based No ?Has Compression in Place as Prescribed: Yes ?Precautions: ?Pain Present Now: Yes ?Patient Has Alerts: Yes ?Patient Alerts: Patient on Blood Thinner ?type 2 diabetic ?partially blind ?05/10/21 ABI R 1.01 L ?0.9 ?Electronic Signature(s) ?Signed: 05/16/2021 8:20:21 AM By: Levora Dredge ?Previous Signature: 05/15/2021 4:40:06 PM Version By: Levora Dredge ?Entered By: Levora Dredge on 05/16/2021 08:20:21 ?TEREZA, GILHAM ([id number]) ?-------------------------------------------------------------------------------- ?Clinic Level of Care Assessment Details ?Patient Name: Laura Mccarty ?Date of Service: 05/15/2021 2:15 PM ?Medical Record Number: [id number] ?Patient Account Number: [id number] ?Date of Birth/Sex: 09/16/1955 (66 y.o. F) ?Treating RN: Levora Dredge ?Primary Care Croix Presley: Dillard Cannon Other Clinician: ?Referring Kaiea Esselman: Dillard Cannon ?Treating Camari Wisham/Extender: Jeri Cos ?Weeks in Treatment: 3 ?Clinic Level of Care  Assessment Items ?TOOL 4 Quantity Score ?'[]'$  - Use when only an EandM is performed on FOLLOW-UP visit 0 ?ASSESSMENTS - Nursing Assessment / Reassessment ?'[]'$  - Reassessment of Co-morbidities (includes updates in patient status) 0 ?X- 1 5 ?Reassessment of Adherence to Treatment Plan ?ASSESSMENTS - Wound and Skin Assessment / Reassessment ?X - Simple Wound Assessment / Reassessment - one wound 1 5 ?'[]'$  - 0 ?Complex Wound Assessment / Reassessment - multiple wounds ?'[]'$  - 0 ?Dermatologic / Skin Assessment (not related to wound area) ?ASSESSMENTS - Focused Assessment ?'[]'$  - Circumferential Edema Measurements - multi extremities 0 ?'[]'$  - 0 ?Nutritional Assessment / Counseling / Intervention ?'[]'$  - 0 ?Lower Extremity Assessment (monofilament, tuning fork, pulses) ?'[]'$  - 0 ?Peripheral Arterial Disease Assessment (using hand held doppler) ?ASSESSMENTS - Ostomy and/or Continence Assessment and Care ?'[]'$  - Incontinence Assessment and Management 0 ?'[]'$  - 0 ?Ostomy Care Assessment and Management (repouching, etc.) ?PROCESS - Coordination of Care ?X - Simple Patient / Family Education for ongoing care 1 15 ?'[]'$  - 0 ?Complex (extensive) Patient / Family Education for ongoing care ?'[]'$  - 0 ?Staff obtains Consents, Records, Test Results / Process Orders ?'[]'$  - 0 ?Staff telephones HHA, Nursing Homes / Clarify orders / etc ?'[]'$  - 0 ?Routine Transfer to another Facility (non-emergent condition) ?'[]'$  - 0 ?Routine Hospital Admission (non-emergent condition) ?'[]'$  - 0 ?New Admissions / Biomedical engineer / Ordering NPWT, Apligraf, etc. ?'[]'$  - 0 ?Emergency Hospital Admission (emergent condition) ?X- 1 10 ?Simple Discharge Coordination ?'[]'$  - 0 ?Complex (extensive) Discharge Coordination ?PROCESS - Special Needs ?'[]'$  - Pediatric / Minor Patient Management 0 ?'[]'$  - 0 ?Isolation Patient Management ?'[]'$  - 0 ?Hearing / Language / Visual special needs ?'[]'$  - 0 ?Assessment of Community assistance (transportation, D/C planning, etc.) ?'[]'$  - 0 ?Additional  assistance / Altered mentation ?'[]'$  - 0 ?Support Surface(s) Assessment (bed, cushion, seat, etc.) ?INTERVENTIONS - Wound Cleansing / Measurement ?ROBIE, OATS ([id number]) ?X- 1 5 ?Simple Wound Cleansing - one wound ?'[]'$  - 0 ?Complex Wound Cleansing -  multiple wounds ?X- 1 5 ?Wound Imaging (photographs - any number of wounds) ?'[]'$  - 0 ?Wound Tracing (instead of photographs) ?X- 1 5 ?Simple Wound Measurement - one wound ?'[]'$  - 0 ?Complex Wound Measurement - multiple wounds ?INTERVENTIONS - Wound Dressings ?X - Small Wound Dressing one or multiple wounds 1 10 ?'[]'$  - 0 ?Medium Wound Dressing one or multiple wounds ?'[]'$  - 0 ?Large Wound Dressing one or multiple wounds ?'[]'$  - 0 ?Application of Medications - topical ?'[]'$  - 0 ?Application of Medications - injection ?INTERVENTIONS - Miscellaneous ?'[]'$  - External ear exam 0 ?'[]'$  - 0 ?Specimen Collection (cultures, biopsies, blood, body fluids, etc.) ?'[]'$  - 0 ?Specimen(s) / Culture(s) sent or taken to Lab for analysis ?'[]'$  - 0 ?Patient Transfer (multiple staff / Civil Service fast streamer / Similar devices) ?'[]'$  - 0 ?Simple Staple / Suture removal (25 or less) ?'[]'$  - 0 ?Complex Staple / Suture removal (26 or more) ?'[]'$  - 0 ?Hypo / Hyperglycemic Management (close monitor of Blood Glucose) ?'[]'$  - 0 ?Ankle / Brachial Index (ABI) - do not check if billed separately ?X- 1 5 ?Vital Signs ?Has the patient been seen at the hospital within the last three years: Yes ?Total Score: 65 ?Level Of Care: New/Established - Level ?2 ?Electronic Signature(s) ?Signed: 05/15/2021 4:40:06 PM By: Levora Dredge ?Entered By: Levora Dredge on 05/15/2021 16:01:10 ?AINARA, ELDRIDGE ([id number]) ?-------------------------------------------------------------------------------- ?Encounter Discharge Information Details ?Patient Name: Laura Mccarty ?Date of Service: 05/15/2021 2:15 PM ?Medical Record Number: [id number] ?Patient Account Number: [id number] ?Date of Birth/Sex: May 02, 1955 (66 y.o. F) ?Treating RN: Levora Dredge ?Primary Care  Navdeep Fessenden: Dillard Cannon Other Clinician: ?Referring Jillien Yakel: Dillard Cannon ?Treating Rees Matura/Extender: Jeri Cos ?Weeks in Treatment: 3 ?Encounter Discharge Information Items ?Discharge Condition: Stable ?Ambulatory Status: Wheelchair ?Discharge Destination: Home ?Transportation: Private Auto ?Accompanied By: husband ?Schedule Follow-up Appointment: Yes ?Clinical Summary of Care: ?Electronic Signature(s) ?Signed: 05/15/2021 4:02:45 PM By: Levora Dredge ?Entered By: Levora Dredge on 05/15/2021 16:02:45 ?KELLEIGH, SKERRITT ([id number]) ?-------------------------------------------------------------------------------- ?Lower Extremity Assessment Details ?Patient Name: SHATIKA, GRINNELL ?Date of Service: 05/15/2021 2:15 PM ?Medical Record Number: [id number] ?Patient Account Number: [id number] ?Date of Birth/Sex: 27-Nov-1955 (66 y.o. F) ?Treating RN: Levora Dredge ?Primary Care Jatin Naumann: Dillard Cannon Other Clinician: ?Referring Loula Marcella: Dillard Cannon ?Treating Echo Allsbrook/Extender: Jeri Cos ?Weeks in Treatment: 3 ?Edema Assessment ?Assessed: [Left: No] [Right: No] ?Edema: [Left: Yes] [Right: Yes] ?Calf ?Left: Right: ?Point of Measurement: 34 cm From Medial Instep 67 cm 69 cm ?Ankle ?Left: Right: ?Point of Measurement: 10 cm From Medial Instep 39 cm 39.5 cm ?Electronic Signature(s) ?Signed: 05/15/2021 4:40:06 PM By: Levora Dredge ?Entered By: Levora Dredge on 05/15/2021 14:32:29 ?CORAL, TIMME ([id number]) ?-------------------------------------------------------------------------------- ?Multi Wound Chart Details ?Patient Name: BRYLIN, STOPPER ?Date of Service: 05/15/2021 2:15 PM ?Medical Record Number: [id number] ?Patient Account Number: [id number] ?Date of Birth/Sex: 09/07/1955 (66 y.o. F) ?Treating RN: Levora Dredge ?Primary Care Azizi Bally: Dillard Cannon Other Clinician: ?Referring Sander Speckman: Dillard Cannon ?Treating Journii Nierman/Extender: Jeri Cos ?Weeks in Treatment: 3 ?Vital Signs ?Height(in):  64 ?Pulse(bpm): 60 ?Weight(lbs): 360 ?Blood Pressure(mmHg): 157/58 ?Body Mass Index(BMI): 61.8 ?Temperature(??F): 97.5 ?Respiratory Rate(breaths/min): 18 ?Photos: [N/A:N/A] ?Wound Location: Right, Plantar Foot N/A N/A ?Wound

## 2021-05-15 NOTE — Progress Notes (Signed)
JANECIA, PALAU ([id number]) ?Visit Report for 05/15/2021 ?Chief Complaint Document Details ?Patient Name: Laura Mccarty, Laura Mccarty ?Date of Service: 05/15/2021 2:15 PM ?Medical Record Number: [id number] ?Patient Account Number: [id number] ?Date of Birth/Sex: 28-Sep-1955 (66 y.o. F) ?Treating RN: Levora Dredge ?Primary Care Provider: Dillard Cannon Other Clinician: ?Referring Provider: Dillard Cannon ?Treating Provider/Extender: Jeri Cos ?Weeks in Treatment: 3 ?Information Obtained from: Patient ?Chief Complaint ?Left foot ulcer ?Electronic Signature(s) ?Signed: 05/15/2021 2:16:32 PM By: Worthy Keeler PA-C ?Entered By: Worthy Keeler on 05/15/2021 14:16:31 ?ZANYA, LINDO ([id number]) ?-------------------------------------------------------------------------------- ?HPI Details ?Patient Name: Laura Mccarty, Laura Mccarty ?Date of Service: 05/15/2021 2:15 PM ?Medical Record Number: [id number] ?Patient Account Number: [id number] ?Date of Birth/Sex: [birth year] (66 y.o. F) ?Treating RN: Levora Dredge ?Primary Care Provider: Dillard Cannon Other Clinician: ?Referring Provider: Dillard Cannon ?Treating Provider/Extender: Jeri Cos ?Weeks in Treatment: 3 ?History of Present Illness ?HPI Description: 04/18/2021 upon evaluation today patient presents for initial inspection here in the clinic regarding issues that she has been ?having with her bilateral lower extremities and feet. The right plantar foot ulcer she has had for about a year she tells me. With that being said ?the left plantar foot ulcer which is actually more on the toe of the third toe and fifth toes are much more new in fact she was not aware that was ?there until we saw them today. Fortunately I do not see any evidence of active infection locally or systemically which is great news. No fever chills ?noted the patient does have several chronic major medical problems that do contribute to the issue going on at hand. ?Patient does have a history of diabetes mellitus type 2,  lymphedema, hypertension, congestive heart failure, and chronic kidney disease stage III. ?She does have lymphedema pumps though she tells me she is not really able to use them due to how much the bother and hurt her. ?3/14; patient presents for follow-up. She has home health who comes out twice weekly to help with dressing changes. She has no issues or ?complaints today. She denies signs of infection. ?3/22; patient presents for follow-up. She reports increased weeping to the left heel. She has lymphedema pumps but is currently not using them. ?She is scheduled to have ABIs on 3/29. She currently denies signs of infection. ?05/08/2021 upon evaluation patient appears to be doing worse in regard to her right foot ulcer. The other wounds on her left foot and heel all ?seem to have closed she still has some weeping on the left heel region but this is not an open wound at this point. Fortunately I do not see any ?signs of infection here. On the right foot this is a different story I think we will get a probably need to see about looking into a culture today and ?then subsequently were also going to look into getting her started on an antibiotic as well. ?05/15/2021 Upon evaluation today patient appears to be doing well with regard to her right plantar foot ulcer. Overall I am very pleased with where ?we stand and I think she is doing much better even than last week. I do not see any signs of active infection locally or systemically at this time ?which is great news. No fevers, chills, nausea, vomiting, or diarrhea. ?Electronic Signature(s) ?Signed: 05/15/2021 2:45:05 PM By: Worthy Keeler PA-C ?Entered By: Worthy Keeler on 05/15/2021 14:45:04 ?Laura Mccarty, Laura Mccarty ([id number]) ?-------------------------------------------------------------------------------- ?Physical Exam Details ?Patient Name: Laura Mccarty, Laura Mccarty ?Date of Service: 05/15/2021 2:15 PM ?Medical Record Number: [id number] ?Patient Account Number: [id number] ?Date of Birth/Sex:  12-19-1955 (66 y.o. F) ?Treating RN: Levora Dredge ?Primary Care Provider: Dillard Cannon Other Clinician: ?Referring Provider: Dillard Cannon ?Treating Provider/Extender: Jeri Cos ?Weeks in Treatment: 3 ?Constitutional ?Well-nourished and well-hydrated in no acute distress. ?Respiratory ?normal breathing without difficulty. ?Psychiatric ?this patient is able to make decisions and demonstrates good insight into disease process. Alert and Oriented x 3. pleasant and cooperative. ?Notes ?Upon inspection patient's wound bed actually showed evidence of good granulation and epithelization at this point. Fortunately I do not see any ?evidence of infection locally or systemically which is great news and overall I am extremely happy with where we stand today. ?Electronic Signature(s) ?Signed: 05/15/2021 2:45:21 PM By: Worthy Keeler PA-C ?Entered By: Worthy Keeler on 05/15/2021 14:45:21 ?Laura Mccarty, Laura Mccarty ([id number]) ?-------------------------------------------------------------------------------- ?Physician Orders Details ?Patient Name: Laura Mccarty, Laura Mccarty ?Date of Service: 05/15/2021 2:15 PM ?Medical Record Number: [id number] ?Patient Account Number: [id number] ?Date of Birth/Sex: 02/27/1955 (66 y.o. F) ?Treating RN: Levora Dredge ?Primary Care Provider: Dillard Cannon Other Clinician: ?Referring Provider: Dillard Cannon ?Treating Provider/Extender: Jeri Cos ?Weeks in Treatment: 3 ?Verbal / Phone Orders: No ?Diagnosis Coding ?ICD-10 Coding ?Code Description ?L97.512 Non-pressure chronic ulcer of other part of right foot with fat layer exposed ?J03.009 Non-pressure chronic ulcer of other part of left foot with fat layer exposed ?E11.621 Type 2 diabetes mellitus with foot ulcer ?I89.0 Lymphedema, not elsewhere classified ?I10 Essential (primary) hypertension ?I50.42 Chronic combined systolic (congestive) and diastolic (congestive) heart failure ?N18.30 Chronic kidney disease, stage 3 unspecified ?Follow-up  Appointments ?o Return Appointment in 1 week. ?o Nurse Visit as needed ?Home Health ?[place] to use lymphedema pumps ?o [facility] for wound care. May utilize formulary equivalent dressing for wound treatment orders unless ?otherwise specified. Home Health Nurse may visit [facility] to address patientos wound care needs. - 2 x week by home health and ?once weekly at wound clinic ?o Scheduled days for dressing changes to be completed; exception, patient has scheduled wound care visit that day. ?o **Please direct any NON-WOUND related issues/requests for orders to patient's Primary Care Physician. **If current ?dressing causes regression in wound condition, may D/C ordered dressing product/s and apply Normal Saline Moist ?Dressing daily until next [place] or Other MD appointment. **Notify [facility] of regression in ?wound condition at [phone]. ?Bathing/ Shower/ Hygiene ?o May shower; gently cleanse wound with antibacterial soap, rinse and pat dry prior to dressing wounds - or keep dressings dry ?if not changed after shower ?o May shower with wound dressing protected with water repellent cover or cast protector. ?o No tub bath. ?Anesthetic (Use 'Patient Medications' Section for Anesthetic Order Entry) ?o Lidocaine applied to wound bed ?Edema Control - Lymphedema / Segmental Compressive Device / Other ?o Elevate, Exercise Daily and Avoid Standing for Long Periods of Time. ?o Elevate leg(s) parallel to the floor when sitting. ?o Compression Pump: Use compression pump on left lower extremity for 60 minutes, twice daily. ?o Compression Pump: Use compression pump on right lower extremity for 60 minutes, twice daily. ?o DO YOUR BEST to sleep in the bed at night. DO NOT sleep in your recliner. Long hours of sitting in a recliner leads to ?swelling of the legs and/or potential wounds on your backside. ?o Other: - Keep vascular  appt on 05/10/21 ?Additional Orders / Instructions ?o Follow Nutritious Diet and Increase Protein Intake ?o Other: - Take diuretic as prescribed ?Medications-Please add to medication list. ?o P.O. Antibiotics -

## 2021-05-22 ENCOUNTER — Encounter: Payer: Medicare HMO | Admitting: Physician Assistant

## 2021-05-22 DIAGNOSIS — L97512 Non-pressure chronic ulcer of other part of right foot with fat layer exposed: Secondary | ICD-10-CM | POA: Diagnosis not present

## 2021-05-22 NOTE — Progress Notes (Signed)
FLORICE, HINDLE ([id number]) ?Visit Report for 05/22/2021 ?Arrival Information Details ?Patient Name: Laura Mccarty, Laura Mccarty ?Date of Service: 05/22/2021 2:15 PM ?Medical Record Number: [id number] ?Patient Account Number: [id number] ?Date of Birth/Sex: [birth year] (66 y.o. F) ?Treating RN: Levora Dredge ?Primary Care Jeret Goyer: Dillard Cannon Other Clinician: ?Referring Ariyan Brisendine: Dillard Cannon ?Treating Tomothy Eddins/Extender: Jeri Cos ?Weeks in Treatment: 4 ?Visit Information History Since Last Visit ?Added or deleted any medications: No ?Patient Arrived: Wheel Chair ?Any new allergies or adverse reactions: No ?Arrival Time: 14:12 ?Had a fall or experienced change in No ?Accompanied By: husband ?activities of daily living that may affect ?Transfer Assistance: EasyPivot Patient Lift ?risk of falls: ?Patient Identification Verified: Yes ?Hospitalized since last visit: No ?Secondary Verification Process Completed: Yes ?Has Dressing in Place as Prescribed: Yes ?Patient Requires Transmission-Based No ?Pain Present Now: No ?Precautions: ?Patient Has Alerts: Yes ?Patient Alerts: Patient on Blood Thinner ?type 2 diabetic ?partially blind ?05/10/21 ABI R 1.01 L ?0.9 ?Electronic Signature(s) ?Signed: 05/22/2021 4:36:08 PM By: Levora Dredge ?Entered By: Levora Dredge on 05/22/2021 14:13:24 ?Laura Mccarty, Laura Mccarty ([id number]) ?-------------------------------------------------------------------------------- ?Clinic Level of Care Assessment Details ?Patient Name: Laura Mccarty ?Date of Service: 05/22/2021 2:15 PM ?Medical Record Number: [id number] ?Patient Account Number: [id number] ?Date of Birth/Sex: [birth year] (66 y.o. F) ?Treating RN: Levora Dredge ?Primary Care Chivonne Rascon: Dillard Cannon Other Clinician: ?Referring Britini Garcilazo: Dillard Cannon ?Treating Yaw Escoto/Extender: Jeri Cos ?Weeks in Treatment: 4 ?Clinic Level of Care Assessment Items ?TOOL 4 Quantity Score ?'[]'$  - Use when only an EandM is performed on FOLLOW-UP visit  0 ?ASSESSMENTS - Nursing Assessment / Reassessment ?'[]'$  - Reassessment of Co-morbidities (includes updates in patient status) 0 ?'[]'$  - 0 ?Reassessment of Adherence to Treatment Plan ?ASSESSMENTS - Wound and Skin Assessment / Reassessment ?'[]'$  - Simple Wound Assessment / Reassessment - one wound 0 ?'[]'$  - 0 ?Complex Wound Assessment / Reassessment - multiple wounds ?'[]'$  - 0 ?Dermatologic / Skin Assessment (not related to wound area) ?ASSESSMENTS - Focused Assessment ?'[]'$  - Circumferential Edema Measurements - multi extremities 0 ?'[]'$  - 0 ?Nutritional Assessment / Counseling / Intervention ?'[]'$  - 0 ?Lower Extremity Assessment (monofilament, tuning fork, pulses) ?'[]'$  - 0 ?Peripheral Arterial Disease Assessment (using hand held doppler) ?ASSESSMENTS - Ostomy and/or Continence Assessment and Care ?'[]'$  - Incontinence Assessment and Management 0 ?'[]'$  - 0 ?Ostomy Care Assessment and Management (repouching, etc.) ?PROCESS - Coordination of Care ?'[]'$  - Simple Patient / Family Education for ongoing care 0 ?'[]'$  - 0 ?Complex (extensive) Patient / Family Education for ongoing care ?'[]'$  - 0 ?Staff obtains Consents, Records, Test Results / Process Orders ?'[]'$  - 0 ?Staff telephones HHA, Nursing Homes / Clarify orders / etc ?'[]'$  - 0 ?Routine Transfer to another Facility (non-emergent condition) ?'[]'$  - 0 ?Routine Hospital Admission (non-emergent condition) ?'[]'$  - 0 ?New Admissions / Biomedical engineer / Ordering NPWT, Apligraf, etc. ?'[]'$  - 0 ?Emergency Hospital Admission (emergent condition) ?'[]'$  - 0 ?Simple Discharge Coordination ?'[]'$  - 0 ?Complex (extensive) Discharge Coordination ?PROCESS - Special Needs ?'[]'$  - Pediatric / Minor Patient Management 0 ?'[]'$  - 0 ?Isolation Patient Management ?'[]'$  - 0 ?Hearing / Language / Visual special needs ?'[]'$  - 0 ?Assessment of Community assistance (transportation, D/C planning, etc.) ?'[]'$  - 0 ?Additional assistance / Altered mentation ?'[]'$  - 0 ?Support Surface(s) Assessment (bed, cushion, seat, etc.) ?INTERVENTIONS -  Wound Cleansing / Measurement ?Laura Mccarty, Laura Mccarty ([id number]) ?'[]'$  - 0 ?Simple Wound Cleansing - one wound ?'[]'$  - 0 ?Complex Wound Cleansing - multiple wounds ?'[]'$  - 0 ?Wound Imaging (photographs - any number of wounds) ?'[]'$  - 0 ?Wound  Tracing (instead of photographs) ?'[]'$  - 0 ?Simple Wound Measurement - one wound ?'[]'$  - 0 ?Complex Wound Measurement - multiple wounds ?INTERVENTIONS - Wound Dressings ?'[]'$  - Small Wound Dressing one or multiple wounds 0 ?'[]'$  - 0 ?Medium Wound Dressing one or multiple wounds ?'[]'$  - 0 ?Large Wound Dressing one or multiple wounds ?'[]'$  - 0 ?Application of Medications - topical ?'[]'$  - 0 ?Application of Medications - injection ?INTERVENTIONS - Miscellaneous ?'[]'$  - External ear exam 0 ?'[]'$  - 0 ?Specimen Collection (cultures, biopsies, blood, body fluids, etc.) ?'[]'$  - 0 ?Specimen(s) / Culture(s) sent or taken to Lab for analysis ?'[]'$  - 0 ?Patient Transfer (multiple staff / Civil Service fast streamer / Similar devices) ?'[]'$  - 0 ?Simple Staple / Suture removal (25 or less) ?'[]'$  - 0 ?Complex Staple / Suture removal (26 or more) ?'[]'$  - 0 ?Hypo / Hyperglycemic Management (close monitor of Blood Glucose) ?'[]'$  - 0 ?Ankle / Brachial Index (ABI) - do not check if billed separately ?'[]'$  - 0 ?Vital Signs ?Has the patient been seen at the hospital within the last three years: Yes ?Total Score: 0 ?Level Of Care: ____ ?Electronic Signature(s) ?Signed: 05/22/2021 4:36:08 PM By: Levora Dredge ?Entered By: Levora Dredge on 05/22/2021 15:51:26 ?Laura Mccarty, Laura Mccarty ([id number]) ?-------------------------------------------------------------------------------- ?Encounter Discharge Information Details ?Patient Name: Laura Mccarty, Laura Mccarty ?Date of Service: 05/22/2021 2:15 PM ?Medical Record Number: [id number] ?Patient Account Number: [id number] ?Date of Birth/Sex: July 13, 1955 (66 y.o. F) ?Treating RN: Levora Dredge ?Primary Care Delrose Rohwer: Dillard Cannon Other Clinician: ?Referring Nikol Lemar: Dillard Cannon ?Treating Fujie Dickison/Extender: Jeri Cos ?Weeks in  Treatment: 4 ?Encounter Discharge Information Items Post Procedure Vitals ?Discharge Condition: Stable ?Temperature (?F): 97.5 ?Ambulatory Status: Wheelchair ?Pulse (bpm): 97 ?Discharge Destination: Home ?Respiratory Rate (breaths/min): 18 ?Transportation: Private Auto ?Blood Pressure (mmHg): 152/79 ?Accompanied By: husband ?Schedule Follow-up Appointment: Yes ?Clinical Summary of Care: Patient Declined ?Electronic Signature(s) ?Signed: 05/22/2021 3:53:23 PM By: Levora Dredge ?Entered By: Levora Dredge on 05/22/2021 15:53:22 ?Laura Mccarty, Laura Mccarty ([id number]) ?-------------------------------------------------------------------------------- ?Lower Extremity Assessment Details ?Patient Name: Laura Mccarty, Laura Mccarty ?Date of Service: 05/22/2021 2:15 PM ?Medical Record Number: [id number] ?Patient Account Number: [id number] ?Date of Birth/Sex: 1955/06/16 (66 y.o. F) ?Treating RN: Levora Dredge ?Primary Care Qasim Diveley: Dillard Cannon Other Clinician: ?Referring Devery Odwyer: Dillard Cannon ?Treating Rabecka Brendel/Extender: Jeri Cos ?Weeks in Treatment: 4 ?Edema Assessment ?Assessed: [Left: No] [Right: No] ?Edema: [Left: Yes] [Right: Yes] ?Calf ?Left: Right: ?Point of Measurement: 34 cm From Medial Instep 66 cm 71 cm ?Ankle ?Left: Right: ?Point of Measurement: 10 cm From Medial Instep 39 cm 40.5 cm ?Electronic Signature(s) ?Signed: 05/22/2021 4:36:08 PM By: Levora Dredge ?Entered By: Levora Dredge on 05/22/2021 14:35:25 ?Laura Mccarty, Laura Mccarty ([id number]) ?-------------------------------------------------------------------------------- ?Multi Wound Chart Details ?Patient Name: Laura Mccarty, Laura Mccarty ?Date of Service: 05/22/2021 2:15 PM ?Medical Record Number: [id number] ?Patient Account Number: [id number] ?Date of Birth/Sex: 03-Sep-1955 (66 y.o. F) ?Treating RN: Levora Dredge ?Primary Care Earsel Shouse: Dillard Cannon Other Clinician: ?Referring Alejandra Barna: Dillard Cannon ?Treating Han Vejar/Extender: Jeri Cos ?Weeks in Treatment: 4 ?Vital  Signs ?Height(in): 64 ?Pulse(bpm): 97 ?Weight(lbs): 360 ?Blood Pressure(mmHg): 152/79 ?Body Mass Index(BMI): 61.8 ?Temperature(??F): 97.5 ?Respiratory Rate(breaths/min): 18 ?Photos: [N/A:N/A] ?Wound Location: Right, Plantar Foot N

## 2021-05-22 NOTE — Progress Notes (Addendum)
Laura Mccarty, Laura Mccarty ([id number]) ?Visit Report for 05/22/2021 ?Chief Complaint Document Details ?Patient Name: Laura Mccarty, Laura Mccarty ?Date of Service: 05/22/2021 2:15 PM ?Medical Record Number: [id number] ?Patient Account Number: [id number] ?Date of Birth/Sex: [birth year] (66 y.o. F) ?Treating RN: Levora Dredge ?Primary Care Provider: Dillard Cannon Other Clinician: ?Referring Provider: Dillard Cannon ?Treating Provider/Extender: Jeri Cos ?Weeks in Treatment: 4 ?Information Obtained from: Patient ?Chief Complaint ?Left foot ulcer ?Electronic Signature(s) ?Signed: 05/22/2021 2:37:51 PM By: Worthy Keeler PA-C ?Entered By: Worthy Keeler on 05/22/2021 14:37:51 ?Laura Mccarty, Laura Mccarty ([id number]) ?-------------------------------------------------------------------------------- ?Debridement Details ?Patient Name: Laura Mccarty ?Date of Service: 05/22/2021 2:15 PM ?Medical Record Number: [id number] ?Patient Account Number: [id number] ?Date of Birth/Sex: 1955-09-21 (66 y.o. F) ?Treating RN: Levora Dredge ?Primary Care Provider: Dillard Cannon Other Clinician: ?Referring Provider: Dillard Cannon ?Treating Provider/Extender: Jeri Cos ?Weeks in Treatment: 4 ?Debridement Performed for ?Wound #1 Right,Plantar Foot ?Assessment: ?Performed By: Physician Tommie Sams., PA-C ?Debridement Type: Debridement ?Severity of Tissue Pre Debridement: Fat layer exposed ?Level of Consciousness (Pre- ?Awake and Alert ?procedure): ?Pre-procedure Verification/Time Out ?Yes - 14:43 ?Taken: ?Total Area Debrided (L x W): 0.8 (cm) x 0.8 (cm) = 0.64 (cm?) ?Tissue and other material ?Viable, Non-Viable, Callus, Slough, Subcutaneous, Biofilm, Slough ?debrided: ?Level: Skin/Subcutaneous Tissue ?Debridement Description: Excisional ?Instrument: Curette ?Bleeding: Moderate ?Hemostasis Achieved: Pressure ?Response to Treatment: Procedure was tolerated well ?Level of Consciousness (Post- ?Awake and Alert ?procedure): ?Post Debridement Measurements of Total  Wound ?Length: (cm) 0.8 ?Width: (cm) 0.8 ?Depth: (cm) 0.2 ?Volume: (cm?) 0.101 ?Character of Wound/Ulcer Post Debridement: Stable ?Severity of Tissue Post Debridement: Fat layer exposed ?Post Procedure Diagnosis ?Same as Pre-procedure ?Electronic Signature(s) ?Signed: 05/22/2021 4:36:08 PM By: Levora Dredge ?Signed: 05/22/2021 4:48:03 PM By: Worthy Keeler PA-C ?Entered By: Levora Dredge on 05/22/2021 14:49:05 ?Laura Mccarty, Laura Mccarty ([id number]) ?-------------------------------------------------------------------------------- ?HPI Details ?Patient Name: Laura Mccarty, Laura Mccarty ?Date of Service: 05/22/2021 2:15 PM ?Medical Record Number: [id number] ?Patient Account Number: [id number] ?Date of Birth/Sex: 05-Dec-1955 (66 y.o. F) ?Treating RN: Levora Dredge ?Primary Care Provider: Dillard Cannon Other Clinician: ?Referring Provider: Dillard Cannon ?Treating Provider/Extender: Jeri Cos ?Weeks in Treatment: 4 ?History of Present Illness ?HPI Description: 04/18/2021 upon evaluation today patient presents for initial inspection here in the clinic regarding issues that she has been ?having with her bilateral lower extremities and feet. The right plantar foot ulcer she has had for about a year she tells me. With that being said ?the left plantar foot ulcer which is actually more on the toe of the third toe and fifth toes are much more new in fact she was not aware that was ?there until we saw them today. Fortunately I do not see any evidence of active infection locally or systemically which is great news. No fever chills ?noted the patient does have several chronic major medical problems that do contribute to the issue going on at hand. ?Patient does have a history of diabetes mellitus type 2, lymphedema, hypertension, congestive heart failure, and chronic kidney disease stage III. ?She does have lymphedema pumps though she tells me she is not really able to use them due to how much the bother and hurt her. ?3/14; patient presents for  follow-up. She has home health who comes out twice weekly to help with dressing changes. She has no issues or ?complaints today. She denies signs of infection. ?3/22; patient presents for follow-up. She reports increased weeping to the left heel. She has lymphedema pumps but is currently not using them. ?She is scheduled to have ABIs on 3/29. She currently denies signs of infection. ?05/08/2021  upon evaluation patient appears to be doing worse in regard to her right foot ulcer. The other wounds on her left foot and heel all ?seem to have closed she still has some weeping on the left heel region but this is not an open wound at this point. Fortunately I do not see any ?signs of infection here. On the right foot this is a different story I think we will get a probably need to see about looking into a culture today and ?then subsequently were also going to look into getting her started on an antibiotic as well. ?05/15/2021 Upon evaluation today patient appears to be doing well with regard to her right plantar foot ulcer. Overall I am very pleased with where ?we stand and I think she is doing much better even than last week. I do not see any signs of active infection locally or systemically at this time ?which is great news. No fevers, chills, nausea, vomiting, or diarrhea. ?05-22-2021 upon evaluation today patient's legs actually appear to be doing about the same. We did review her arterial study which showed ?triphasic waveforms and otherwise completely normal ABIs and TBI readings which is great news. She has no limitations here from the standpoint ?of healing. With that being said she does unfortunately continue to have issues here with some weeping on the lateral aspect of her ankle on the ?left. She also has a plantar foot wound on the right. ?Electronic Signature(s) ?Signed: 05/22/2021 4:12:42 PM By: Worthy Keeler PA-C ?Entered By: Worthy Keeler on 05/22/2021 16:12:41 ?Laura Mccarty, Laura Mccarty  ([id number]) ?-------------------------------------------------------------------------------- ?Physical Exam Details ?Patient Name: Laura Mccarty, Laura Mccarty ?Date of Service: 05/22/2021 2:15 PM ?Medical Record Number: [id number] ?Patient Account Number: [id number] ?Date of Birth/Sex: [birth year] (66 y.o. F) ?Treating RN: Levora Dredge ?Primary Care Provider: Dillard Cannon Other Clinician: ?Referring Provider: Dillard Cannon ?Treating Provider/Extender: Jeri Cos ?Weeks in Treatment: 4 ?Constitutional ?Obese and well-hydrated in no acute distress. ?Respiratory ?normal breathing without difficulty. ?Psychiatric ?this patient is able to make decisions and demonstrates good insight into disease process. Alert and Oriented x 3. pleasant and cooperative. ?Notes ?Patient's wound on the right does show some signs of callus buildup and a little bit of slough buildup as well Georgina Peer go ahead and see what we can ?do about clearing this away today. She tolerated that debridement without complication and postdebridement the wound bed appears to be doing ?much better which is great news. ?Electronic Signature(s) ?Signed: 05/22/2021 4:12:59 PM By: Worthy Keeler PA-C ?Entered By: Worthy Keeler on 05/22/2021 16:12:58 ?Laura Mccarty, Laura Mccarty ([id number]) ?-------------------------------------------------------------------------------- ?Physician Orders Details ?Patient Name: Laura Mccarty, Laura Mccarty ?Date of Service: 05/22/2021 2:15 PM ?Medical Record Number: [id number] ?Patient Account Number: [id number] ?Date of Birth/Sex: 19-Dec-1955 (66 y.o. 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

## 2021-05-29 ENCOUNTER — Encounter: Payer: Medicare HMO | Admitting: Physician Assistant

## 2021-05-29 DIAGNOSIS — L97512 Non-pressure chronic ulcer of other part of right foot with fat layer exposed: Secondary | ICD-10-CM | POA: Diagnosis not present

## 2021-05-29 NOTE — Progress Notes (Addendum)
ALLA, SLOMA ([id number]) ?Visit Report for 05/29/2021 ?Arrival Information Details ?Patient Name: Laura Mccarty, Laura Mccarty ?Date of Service: 05/29/2021 2:15 PM ?Medical Record Number: [id number] ?Patient Account Number: [id number] ?Date of Birth/Sex: [birth year] (66 y.o. F) ?Treating RN: Levora Dredge ?Primary Care Vondra Aldredge: Dillard Cannon Other Clinician: ?Referring Liliana Brentlinger: Dillard Cannon ?Treating Margarie Mcguirt/Extender: Jeri Cos ?Weeks in Treatment: 5 ?Visit Information History Since Last Visit ?Added or deleted any medications: No ?Patient Arrived: Wheel Chair ?Any new allergies or adverse reactions: No ?Arrival Time: 14:13 ?Had a fall or experienced change in No ?Accompanied By: husband ?activities of daily living that may affect ?Transfer Assistance: None ?risk of falls: ?Patient Identification Verified: Yes ?Hospitalized since last visit: No ?Secondary Verification Process Completed: Yes ?Has Dressing in Place as Prescribed: Yes ?Patient Requires Transmission-Based No ?Pain Present Now: No ?Precautions: ?Patient Has Alerts: Yes ?Patient Alerts: Patient on Blood Thinner ?type 2 diabetic ?partially blind ?05/10/21 ABI R 1.01 L ?0.9 ?Electronic Signature(s) ?Signed: 05/29/2021 4:49:52 PM By: Levora Dredge ?Entered By: Levora Dredge on 05/29/2021 14:13:34 ?Laura Mccarty, Laura Mccarty ([id number]) ?-------------------------------------------------------------------------------- ?Clinic Level of Care Assessment Details ?Patient Name: Laura Mccarty, Laura Mccarty ?Date of Service: 05/29/2021 2:15 PM ?Medical Record Number: [id number] ?Patient Account Number: [id number] ?Date of Birth/Sex: [birth year] (66 y.o. F) ?Treating RN: Levora Dredge ?Primary Care Laury Huizar: Dillard Cannon Other Clinician: ?Referring Ayona Yniguez: Dillard Cannon ?Treating Asuna Peth/Extender: Jeri Cos ?Weeks in Treatment: 5 ?Clinic Level of Care Assessment Items ?TOOL 1 Quantity Score ?'[]'$  - Use when EandM and Procedure is performed on INITIAL visit 0 ?ASSESSMENTS - Nursing  Assessment / Reassessment ?'[]'$  - General Physical Exam (combine w/ comprehensive assessment (listed just below) when performed on new ?0 ?pt. evals) ?'[]'$  - 0 ?Comprehensive Assessment (HX, ROS, Risk Assessments, Wounds Hx, etc.) ?ASSESSMENTS - Wound and Skin Assessment / Reassessment ?'[]'$  - Dermatologic / Skin Assessment (not related to wound area) 0 ?ASSESSMENTS - Ostomy and/or Continence Assessment and Care ?'[]'$  - Incontinence Assessment and Management 0 ?'[]'$  - 0 ?Ostomy Care Assessment and Management (repouching, etc.) ?PROCESS - Coordination of Care ?'[]'$  - Simple Patient / Family Education for ongoing care 0 ?'[]'$  - 0 ?Complex (extensive) Patient / Family Education for ongoing care ?'[]'$  - 0 ?Staff obtains Consents, Records, Test Results / Process Orders ?'[]'$  - 0 ?Staff telephones HHA, Nursing Homes / Clarify orders / etc ?'[]'$  - 0 ?Routine Transfer to another Facility (non-emergent condition) ?'[]'$  - 0 ?Routine Hospital Admission (non-emergent condition) ?'[]'$  - 0 ?New Admissions / Biomedical engineer / Ordering NPWT, Apligraf, etc. ?'[]'$  - 0 ?Emergency Hospital Admission (emergent condition) ?PROCESS - Special Needs ?'[]'$  - Pediatric / Minor Patient Management 0 ?'[]'$  - 0 ?Isolation Patient Management ?'[]'$  - 0 ?Hearing / Language / Visual special needs ?'[]'$  - 0 ?Assessment of Community assistance (transportation, D/C planning, etc.) ?'[]'$  - 0 ?Additional assistance / Altered mentation ?'[]'$  - 0 ?Support Surface(s) Assessment (bed, cushion, seat, etc.) ?INTERVENTIONS - Miscellaneous ?'[]'$  - External ear exam 0 ?'[]'$  - 0 ?Patient Transfer (multiple staff / Civil Service fast streamer / Similar devices) ?'[]'$  - 0 ?Simple Staple / Suture removal (25 or less) ?'[]'$  - 0 ?Complex Staple / Suture removal (26 or more) ?'[]'$  - 0 ?Hypo/Hyperglycemic Management (do not check if billed separately) ?'[]'$  - 0 ?Ankle / Brachial Index (ABI) - do not check if billed separately ?Has the patient been seen at the hospital within the last three years: Yes ?Total Score: 0 ?Level Of  Care: ____ ?Laura Mccarty, Laura Mccarty ([id number]) ?Electronic Signature(s) ?Signed: 05/29/2021 4:49:52 PM By: Levora Dredge ?Entered By: Levora Dredge on 05/29/2021 15:06:07 ?Piscitelli,  Pryor Montes ([id number]) ?-------------------------------------------------------------------------------- ?Encounter Discharge Information Details ?Patient Name: Laura Mccarty, Laura Mccarty ?Date of Service: 05/29/2021 2:15 PM ?Medical Record Number: [id number] ?Patient Account Number: [id number] ?Date of Birth/Sex: 11-Jan-1956 (66 y.o. F) ?Treating RN: Levora Dredge ?Primary Care Sid Greener: Dillard Cannon Other Clinician: ?Referring Petros Ahart: Dillard Cannon ?Treating Issaac Shipper/Extender: Jeri Cos ?Weeks in Treatment: 5 ?Encounter Discharge Information Items Post Procedure Vitals ?Discharge Condition: Stable ?Temperature (?F): 97.9 ?Ambulatory Status: Wheelchair ?Pulse (bpm): 98 ?Discharge Destination: Home ?Respiratory Rate (breaths/min): 18 ?Transportation: Private Auto ?Blood Pressure (mmHg): 122/75 ?Accompanied By: friend ?Schedule Follow-up Appointment: Yes ?Clinical Summary of Care: Patient Declined ?Electronic Signature(s) ?Signed: 05/29/2021 4:24:06 PM By: Levora Dredge ?Entered By: Levora Dredge on 05/29/2021 16:24:05 ?DAJE, STARK ([id number]) ?-------------------------------------------------------------------------------- ?Lower Extremity Assessment Details ?Patient Name: Laura Mccarty, Laura Mccarty ?Date of Service: 05/29/2021 2:15 PM ?Medical Record Number: [id number] ?Patient Account Number: [id number] ?Date of Birth/Sex: 1956-01-31 (66 y.o. F) ?Treating RN: Levora Dredge ?Primary Care Allahna Husband: Dillard Cannon Other Clinician: ?Referring Charnetta Wulff: Dillard Cannon ?Treating Greg Eckrich/Extender: Jeri Cos ?Weeks in Treatment: 5 ?Edema Assessment ?Assessed: [Left: No] [Right: No] ?Edema: [Left: Yes] [Right: Yes] ?Calf ?Left: Right: ?Point of Measurement: 34 cm From Medial Instep 67.5 cm 70 cm ?Ankle ?Left: Right: ?Point of Measurement: 10 cm From Medial  Instep 38.5 cm 39.5 cm ?Electronic Signature(s) ?Signed: 05/29/2021 4:49:52 PM By: Levora Dredge ?Entered By: Levora Dredge on 05/29/2021 14:37:32 ?Laura Mccarty, Laura Mccarty ([id number]) ?-------------------------------------------------------------------------------- ?Multi Wound Chart Details ?Patient Name: Laura Mccarty, Laura Mccarty ?Date of Service: 05/29/2021 2:15 PM ?Medical Record Number: [id number] ?Patient Account Number: [id number] ?Date of Birth/Sex: 01-Mar-1955 (66 y.o. F) ?Treating RN: Levora Dredge ?Primary Care Fareed Fung: Dillard Cannon Other Clinician: ?Referring Sigourney Portillo: Dillard Cannon ?Treating Mally Gavina/Extender: Jeri Cos ?Weeks in Treatment: 5 ?Vital Signs ?Height(in): 64 ?Pulse(bpm): 66 ?Weight(lbs): 360 ?Blood Pressure(mmHg): 197/66 ?Body Mass Index(BMI): 61.8 ?Temperature(??F): 97.5 ?Respiratory Rate(breaths/min): 18 ?Photos: [N/A:N/A] ?Wound Location: Right, Plantar Foot Left Calcaneus N/A ?Wounding Event: Gradually Appeared Gradually Appeared N/A ?Primary Etiology: Diabetic Wound/Ulcer of the Lower Lymphedema N/A ?Extremity ?Secondary Etiology: Lymphedema Pressure Ulcer N/A ?Comorbid History: Chronic sinus problems/congestion, Chronic sinus problems/congestion, N/A ?Lymphedema, Congestive Heart Lymphedema, Congestive Heart ?Failure, Hypertension, Type II Failure, Hypertension, Type II ?Diabetes, Neuropathy Diabetes, Neuropathy ?Date Acquired: 04/13/2020 05/03/2021 N/A ?Weeks of Treatment: 5 3 N/A ?Wound Status: Open Open N/A ?Wound Recurrence: No No N/A ?Clustered Wound: No Yes N/A ?Measurements L x W x D (cm) 1.3x0.8x0.3 6x5.5x0.1 N/A ?Area (cm?) : 0.817 25.918 N/A ?Volume (cm?) : 0.245 2.592 N/A ?% Reduction in Area: -4.10% -724.90% N/A ?% Reduction in Volume: -3.80% -725.50% N/A ?Classification: Grade 2 Partial Thickness N/A ?Exudate Amount: Medium Medium N/A ?Exudate Type: Serosanguineous Serosanguineous N/A ?Exudate Color: red, brown red, brown N/A ?Granulation Amount: Small (1-33%) Medium (34-66%)  N/A ?Granulation Quality: Pink Pink N/A ?Necrotic Amount: Large (67-100%) Medium (34-66%) N/A ?Exposed Structures: ?Fat Layer (Subcutaneous Tissue): ?Fat Layer (Subcutaneous Tissue): N/A ?Yes Yes ?Fascia: No ?

## 2021-05-29 NOTE — Progress Notes (Addendum)
AHLIYAH, NIENOW ([id number]) ?Visit Report for 05/29/2021 ?Chief Complaint Document Details ?Patient Name: Laura Mccarty, Laura Mccarty ?Date of Service: 05/29/2021 2:15 PM ?Medical Record Number: [id number] ?Patient Account Number: [id number] ?Date of Birth/Sex: [birth year] (66 y.o. F) ?Treating RN: Levora Dredge ?Primary Care Provider: Dillard Cannon Other Clinician: ?Referring Provider: Dillard Cannon ?Treating Provider/Extender: Jeri Cos ?Weeks in Treatment: 5 ?Information Obtained from: Patient ?Chief Complaint ?Left foot ulcer ?Electronic Signature(s) ?Signed: 05/29/2021 2:12:45 PM By: Worthy Keeler PA-C ?Entered By: Worthy Keeler on 05/29/2021 14:12:45 ?Laura Mccarty, Laura Mccarty ([id number]) ?-------------------------------------------------------------------------------- ?Debridement Details ?Patient Name: Laura Mccarty, Laura Mccarty ?Date of Service: 05/29/2021 2:15 PM ?Medical Record Number: [id number] ?Patient Account Number: [id number] ?Date of Birth/Sex: Jun 15, 1955 (66 y.o. F) ?Treating RN: Levora Dredge ?Primary Care Provider: Dillard Cannon Other Clinician: ?Referring Provider: Dillard Cannon ?Treating Provider/Extender: Jeri Cos ?Weeks in Treatment: 5 ?Debridement Performed for ?Wound #1 Right,Plantar Foot ?Assessment: ?Performed By: Physician Tommie Sams., PA-C ?Debridement Type: Debridement ?Severity of Tissue Pre Debridement: Fat layer exposed ?Level of Consciousness (Pre- ?Awake and Alert ?procedure): ?Pre-procedure Verification/Time Out ?Yes - 14:47 ?Taken: ?Total Area Debrided (L x W): 1.3 (cm) x 0.8 (cm) = 1.04 (cm?) ?Tissue and other material ?Viable, Non-Viable, Callus, Slough, Subcutaneous, [place] ?debrided: ?Level: Skin/Subcutaneous Tissue ?Debridement Description: Excisional ?Instrument: Curette ?Bleeding: Moderate ?Hemostasis Achieved: Pressure ?Response to Treatment: Procedure was tolerated well ?Level of Consciousness (Post- ?Awake and Alert ?procedure): ?Post Debridement Measurements of Total Wound ?Length:  (cm) 1.3 ?Width: (cm) 0.8 ?Depth: (cm) 0.3 ?Volume: (cm?) 0.245 ?Character of Wound/Ulcer Post Debridement: Stable ?Severity of Tissue Post Debridement: Fat layer exposed ?Post Procedure Diagnosis ?Same as Pre-procedure ?Electronic Signature(s) ?Signed: 05/29/2021 4:40:44 PM By: Worthy Keeler PA-C ?Signed: 05/29/2021 4:49:52 PM By: Levora Dredge ?Entered By: Levora Dredge on 05/29/2021 14:50:09 ?Laura Mccarty, Laura Mccarty ([id number]) ?-------------------------------------------------------------------------------- ?HPI Details ?Patient Name: Laura Mccarty, Laura Mccarty ?Date of Service: 05/29/2021 2:15 PM ?Medical Record Number: [id number] ?Patient Account Number: [id number] ?Date of Birth/Sex: March 19, 1955 (66 y.o. F) ?Treating RN: Levora Dredge ?Primary Care Provider: Dillard Cannon Other Clinician: ?Referring Provider: Dillard Cannon ?Treating Provider/Extender: Jeri Cos ?Weeks in Treatment: 5 ?History of Present Illness ?HPI Description: 04/18/2021 upon evaluation today patient presents for initial inspection here in the clinic regarding issues that she has been ?having with her bilateral lower extremities and feet. The right plantar foot ulcer she has had for about a year she tells me. With that being said ?the left plantar foot ulcer which is actually more on the toe of the third toe and fifth toes are much more new in fact she was not aware that was ?there until we saw them today. Fortunately I do not see any evidence of active infection locally or systemically which is great news. No fever chills ?noted the patient does have several chronic major medical problems that do contribute to the issue going on at hand. ?Patient does have a history of diabetes mellitus type 2, lymphedema, hypertension, congestive heart failure, and chronic kidney disease stage III. ?She does have lymphedema pumps though she tells me she is not really able to use them due to how much the bother and hurt her. ?3/14; patient presents for follow-up. She  has home health who comes out twice weekly to help with dressing changes. She has no issues or ?complaints today. She denies signs of infection. ?3/22; patient presents for follow-up. She reports increased weeping to the left heel. She has lymphedema pumps but is currently not using them. ?She is scheduled to have ABIs on 3/29. She currently denies signs of infection. ?05/08/2021 upon  evaluation patient appears to be doing worse in regard to her right foot ulcer. The other wounds on her left foot and heel all ?seem to have closed she still has some weeping on the left heel region but this is not an open wound at this point. Fortunately I do not see any ?signs of infection here. On the right foot this is a different story I think we will get a probably need to see about looking into a culture today and ?then subsequently were also going to look into getting her started on an antibiotic as well. ?05/15/2021 Upon evaluation today patient appears to be doing well with regard to her right plantar foot ulcer. Overall I am very pleased with where ?we stand and I think she is doing much better even than last week. I do not see any signs of active infection locally or systemically at this time ?which is great news. No fevers, chills, nausea, vomiting, or diarrhea. ?05-22-2021 upon evaluation today patient's legs actually appear to be doing about the same. We did review her arterial study which showed ?triphasic waveforms and otherwise completely normal ABIs and TBI readings which is great news. She has no limitations here from the standpoint ?of healing. With that being said she does unfortunately continue to have issues here with some weeping on the lateral aspect of her ankle on the ?left. She also has a plantar foot wound on the right. ?05-29-2021 upon evaluation today patient appears to be doing well with regard to her right leg I really feel like this is showing signs of significant ?improvement which is great news. There  does not appear to be any evidence of active infection locally or systemically which is excellent as well. ?Overall I think that we are headed in the right direction. With that being said on the right foot I do see some evidence of some bruising around the ?edges of her wound this is also where she tells me she is hurting mostly. I believe this is likely due to pressure probably from when she is sitting ?up and does not have her feet elevated. Nonetheless we discussed some ways to try to prevent this from happening going forward one of the ?biggest I think is going to be utilizing a donut pillow in order to span the area where the wound is so it does not touch the floor at all. ?Electronic Signature(s) ?Signed: 05/29/2021 2:52:46 PM By: Worthy Keeler PA-C ?Entered By: Worthy Keeler on 05/29/2021 14:52:46 ?Laura Mccarty, Laura Mccarty ([id number]) ?-------------------------------------------------------------------------------- ?Physical Exam Details ?Patient Name: Laura Mccarty, Laura Mccarty ?Date of Service: 05/29/2021 2:15 PM ?Medical Record Number: [id number] ?Patient Account Number: [id number] ?Date of Birth/Sex: 09/27/1955 (66 y.o. F) ?Treating RN: Levora Dredge ?Primary Care Provider: Dillard Cannon Other Clinician: ?Referring Provider: Dillard Cannon ?Treating Provider/Extender: Jeri Cos ?Weeks in Treatment: 5 ?Constitutional ?Well-nourished and well-hydrated in no acute distress. ?Respiratory ?normal breathing without difficulty. ?Psychiatric ?this patient is able to make decisions and demonstrates good insight into disease process. Alert and Oriented x 3. pleasant and cooperative. ?Notes ?Upon inspection patient's wound bed actually showed signs of good granulation and epithelization for the most part. Overall I think that we are ?headed in the right direction. I did perform some debridement in regard to the right foot. She tolerated this today without complication and ?postdebridement wound bed appears to be doing much  better. ?Electronic Signature(s) ?Signed: 05/29/2021 2:53:06 PM By: Worthy Keeler PA-C ?Entered By: Worthy Keeler on 05/29/2021 14:53:06 ?Laura Mccarty, Laura Mccarty ([id number]) ?--------------------------------------

## 2021-06-05 ENCOUNTER — Encounter: Payer: Medicare HMO | Admitting: Physician Assistant

## 2021-06-05 DIAGNOSIS — L97512 Non-pressure chronic ulcer of other part of right foot with fat layer exposed: Secondary | ICD-10-CM | POA: Diagnosis not present

## 2021-06-05 NOTE — Progress Notes (Addendum)
Laura Mccarty ([id number]) ?Visit Report for 06/05/2021 ?Chief Complaint Document Details ?Patient Name: Laura, Mccarty ?Date of Service: 06/05/2021 2:15 PM ?Medical Record Number: [id number] ?Patient Account Number: [id number] ?Date of Birth/Sex: 09/27/1955 (66 y.o. F) ?Treating RN: Levora Dredge ?Primary Care Provider: Dillard Cannon Other Clinician: ?Referring Provider: Dillard Cannon ?Treating Provider/Extender: Jeri Cos ?Weeks in Treatment: 6 ?Information Obtained from: Patient ?Chief Complaint ?Left foot ulcer ?Electronic Signature(s) ?Signed: 06/05/2021 2:46:21 PM By: Worthy Keeler PA-C ?Entered By: Worthy Keeler on 06/05/2021 14:46:21 ?Laura, Mccarty ([id number]) ?-------------------------------------------------------------------------------- ?Debridement Details ?Patient Name: Laura, Mccarty ?Date of Service: 06/05/2021 2:15 PM ?Medical Record Number: [id number] ?Patient Account Number: [id number] ?Date of Birth/Sex: 1955-11-13 (66 y.o. F) ?Treating RN: Levora Dredge ?Primary Care Provider: Dillard Cannon Other Clinician: ?Referring Provider: Dillard Cannon ?Treating Provider/Extender: Jeri Cos ?Weeks in Treatment: 6 ?Debridement Performed for ?Wound #1 Right,Plantar Foot ?Assessment: ?Performed By: Physician Tommie Sams., PA-C ?Debridement Type: Debridement ?Severity of Tissue Pre Debridement: Fat layer exposed ?Level of Consciousness (Pre- ?Awake and Alert ?procedure): ?Pre-procedure Verification/Time Out ?Yes - 14:49 ?Taken: ?Pain Control: Lidocaine 4% Topical Solution ?Total Area Debrided (L x W): 0.9 (cm) x 0.6 (cm) = 0.54 (cm?) ?Tissue and other material ?Viable, Non-Viable, Callus, Slough, Subcutaneous, Urich ?debrided: ?Level: Skin/Subcutaneous Tissue ?Debridement Description: Excisional ?Instrument: Curette ?Bleeding: Minimum ?Hemostasis Achieved: Pressure ?Response to Treatment: Procedure was tolerated well ?Level of Consciousness (Post- ?Awake and Alert ?procedure): ?Post  Debridement Measurements of Total Wound ?Length: (cm) 0.9 ?Width: (cm) 0.6 ?Depth: (cm) 0.2 ?Volume: (cm?) 0.085 ?Character of Wound/Ulcer Post Debridement: Stable ?Severity of Tissue Post Debridement: Fat layer exposed ?Post Procedure Diagnosis ?Same as Pre-procedure ?Electronic Signature(s) ?Signed: 06/05/2021 4:22:54 PM By: Levora Dredge ?Signed: 06/05/2021 5:11:41 PM By: Worthy Keeler PA-C ?Entered By: Levora Dredge on 06/05/2021 14:50:34 ?Laura, Mccarty ([id number]) ?-------------------------------------------------------------------------------- ?HPI Details ?Patient Name: Laura, Mccarty ?Date of Service: 06/05/2021 2:15 PM ?Medical Record Number: [id number] ?Patient Account Number: [id number] ?Date of Birth/Sex: 03-08-1955 (66 y.o. F) ?Treating RN: Levora Dredge ?Primary Care Provider: Dillard Cannon Other Clinician: ?Referring Provider: Dillard Cannon ?Treating Provider/Extender: Jeri Cos ?Weeks in Treatment: 6 ?History of Present Illness ?HPI Description: 04/18/2021 upon evaluation today patient presents for initial inspection here in the clinic regarding issues that she has been ?having with her bilateral lower extremities and feet. The right plantar foot ulcer she has had for about a year she tells me. With that being said ?the left plantar foot ulcer which is actually more on the toe of the third toe and fifth toes are much more new in fact she was not aware that was ?there until we saw them today. Fortunately I do not see any evidence of active infection locally or systemically which is great news. No fever chills ?noted the patient does have several chronic major medical problems that do contribute to the issue going on at hand. ?Patient does have a history of diabetes mellitus type 2, lymphedema, hypertension, congestive heart failure, and chronic kidney disease stage III. ?She does have lymphedema pumps though she tells me she is not really able to use them due to how much the bother and hurt  her. ?3/14; patient presents for follow-up. She has home health who comes out twice weekly to help with dressing changes. She has no issues or ?complaints today. She denies signs of infection. ?3/22; patient presents for follow-up. She reports increased weeping to the left heel. She has lymphedema pumps but is currently not using them. ?She is scheduled to have ABIs on 3/29. She currently  denies signs of infection. ?05/08/2021 upon evaluation patient appears to be doing worse in regard to her right foot ulcer. The other wounds on her left foot and heel all ?seem to have closed she still has some weeping on the left heel region but this is not an open wound at this point. Fortunately I do not see any ?signs of infection here. On the right foot this is a different story I think we will get a probably need to see about looking into a culture today and ?then subsequently were also going to look into getting her started on an antibiotic as well. ?05/15/2021 Upon evaluation today patient appears to be doing well with regard to her right plantar foot ulcer. Overall I am very pleased with where ?we stand and I think she is doing much better even than last week. I do not see any signs of active infection locally or systemically at this time ?which is great news. No fevers, chills, nausea, vomiting, or diarrhea. ?05-22-2021 upon evaluation today patient's legs actually appear to be doing about the same. We did review her arterial study which showed ?triphasic waveforms and otherwise completely normal ABIs and TBI readings which is great news. She has no limitations here from the standpoint ?of healing. With that being said she does unfortunately continue to have issues here with some weeping on the lateral aspect of her ankle on the ?left. She also has a plantar foot wound on the right. ?05-29-2021 upon evaluation today patient appears to be doing well with regard to her right leg I really feel like this is showing signs of  significant ?improvement which is great news. There does not appear to be any evidence of active infection locally or systemically which is excellent as well. ?Overall I think that we are headed in the right direction. With that being said on the right foot I do see some evidence of some bruising around the ?edges of her wound this is also where she tells me she is hurting mostly. I believe this is likely due to pressure probably from when she is sitting ?up and does not have her feet elevated. Nonetheless we discussed some ways to try to prevent this from happening going forward one of the ?biggest I think is going to be utilizing a donut pillow in order to span the area where the wound is so it does not touch the floor at all. ?06-05-2021 upon evaluation today patient appears to be doing better in a lot of regards at this point in regard to her lower extremities. She does ?have some callus still to remove around the area of the foot plantar warts she does still have an open wound on the side. With that being said on ?the right lower extremity the area of weeping is actually doing significantly better which is great news. ?Electronic Signature(s) ?Signed: 06/05/2021 5:01:36 PM By: Worthy Keeler PA-C ?Entered By: Worthy Keeler on 06/05/2021 17:01:36 ?Laura, Mccarty ([id number]) ?-------------------------------------------------------------------------------- ?Physical Exam Details ?Patient Name: Laura, Mccarty ?Date of Service: 06/05/2021 2:15 PM ?Medical Record Number: [id number] ?Patient Account Number: [id number] ?Date of Birth/Sex: 06-06-1955 (66 y.o. F) ?Treating RN: Levora Dredge ?Primary Care Provider: Dillard Cannon Other Clinician: ?Referring Provider: Dillard Cannon ?Treating Provider/Extender: Jeri Cos ?Weeks in Treatment: 6 ?Constitutional ?Obese and well-hydrated in no acute distress. ?Respiratory ?normal breathing without difficulty. ?Psychiatric ?this patient is able to make decisions and  demonstrates good insight into disease process. Alert and Oriented x 3. pleasant and cooperative. ?Notes ?  Upon inspection patient's wound bed actually showed signs of good granulation physician at this point. [place]

## 2021-06-05 NOTE — Progress Notes (Signed)
MAURISA, TESMER ([id number]) ?Visit Report for 06/05/2021 ?Arrival Information Details ?Patient Name: Laura Mccarty, Laura Mccarty ?Date of Service: 06/05/2021 2:15 PM ?Medical Record Number: [id number] ?Patient Account Number: [id number] ?Date of Birth/Sex: 10-31-1955 (66 y.o. F) ?Treating RN: Levora Dredge ?Primary Care Kynlee Koenigsberg: Dillard Cannon Other Clinician: ?Referring Yannis Gumbs: Dillard Cannon ?Treating Manus Weedman/Extender: Jeri Cos ?Weeks in Treatment: 6 ?Visit Information History Since Last Visit ?Added or deleted any medications: No ?Patient Arrived: Wheel Chair ?Any new allergies or adverse reactions: No ?Arrival Time: 14:04 ?Had a fall or experienced change in No ?Accompanied By: husband ?activities of daily living that may affect ?Transfer Assistance: EasyPivot Patient Lift ?risk of falls: ?Patient Identification Verified: Yes ?Hospitalized since last visit: No ?Secondary Verification Process Completed: Yes ?Has Dressing in Place as Prescribed: Yes ?Patient Requires Transmission-Based No ?Pain Present Now: No ?Precautions: ?Patient Has Alerts: Yes ?Patient Alerts: Patient on Blood Thinner ?type 2 diabetic ?partially blind ?05/10/21 ABI R 1.01 L ?0.9 ?Electronic Signature(s) ?Signed: 06/05/2021 4:22:54 PM By: Levora Dredge ?Entered By: Levora Dredge on 06/05/2021 14:05:35 ?Laura Mccarty, Laura Mccarty ([id number]) ?-------------------------------------------------------------------------------- ?Clinic Level of Care Assessment Details ?Patient Name: Laura Mccarty, Laura Mccarty ?Date of Service: 06/05/2021 2:15 PM ?Medical Record Number: [id number] ?Patient Account Number: [id number] ?Date of Birth/Sex: [birth year] (66 y.o. F) ?Treating RN: Levora Dredge ?Primary Care Alianys Chacko: Dillard Cannon Other Clinician: ?Referring Isidore Margraf: Dillard Cannon ?Treating Netanel Yannuzzi/Extender: Jeri Cos ?Weeks in Treatment: 6 ?Clinic Level of Care Assessment Items ?TOOL 1 Quantity Score ?'[]'$  - Use when EandM and Procedure is performed on INITIAL visit  0 ?ASSESSMENTS - Nursing Assessment / Reassessment ?'[]'$  - General Physical Exam (combine w/ comprehensive assessment (listed just below) when performed on new ?0 ?pt. evals) ?'[]'$  - 0 ?Comprehensive Assessment (HX, ROS, Risk Assessments, Wounds Hx, etc.) ?ASSESSMENTS - Wound and Skin Assessment / Reassessment ?'[]'$  - Dermatologic / Skin Assessment (not related to wound area) 0 ?ASSESSMENTS - Ostomy and/or Continence Assessment and Care ?'[]'$  - Incontinence Assessment and Management 0 ?'[]'$  - 0 ?Ostomy Care Assessment and Management (repouching, etc.) ?PROCESS - Coordination of Care ?'[]'$  - Simple Patient / Family Education for ongoing care 0 ?'[]'$  - 0 ?Complex (extensive) Patient / Family Education for ongoing care ?'[]'$  - 0 ?Staff obtains Consents, Records, Test Results / Process Orders ?'[]'$  - 0 ?Staff telephones HHA, Nursing Homes / Clarify orders / etc ?'[]'$  - 0 ?Routine Transfer to another Facility (non-emergent condition) ?'[]'$  - 0 ?Routine Hospital Admission (non-emergent condition) ?'[]'$  - 0 ?New Admissions / Biomedical engineer / Ordering NPWT, Apligraf, etc. ?'[]'$  - 0 ?Emergency Hospital Admission (emergent condition) ?PROCESS - Special Needs ?'[]'$  - Pediatric / Minor Patient Management 0 ?'[]'$  - 0 ?Isolation Patient Management ?'[]'$  - 0 ?Hearing / Language / Visual special needs ?'[]'$  - 0 ?Assessment of Community assistance (transportation, D/C planning, etc.) ?'[]'$  - 0 ?Additional assistance / Altered mentation ?'[]'$  - 0 ?Support Surface(s) Assessment (bed, cushion, seat, etc.) ?INTERVENTIONS - Miscellaneous ?'[]'$  - External ear exam 0 ?'[]'$  - 0 ?Patient Transfer (multiple staff / Civil Service fast streamer / Similar devices) ?'[]'$  - 0 ?Simple Staple / Suture removal (25 or less) ?'[]'$  - 0 ?Complex Staple / Suture removal (26 or more) ?'[]'$  - 0 ?Hypo/Hyperglycemic Management (do not check if billed separately) ?'[]'$  - 0 ?Ankle / Brachial Index (ABI) - do not check if billed separately ?Has the patient been seen at the hospital within the last three years:  Yes ?Total Score: 0 ?Level Of Care: ____ ?Laura Mccarty, Laura Mccarty ([id number]) ?Electronic Signature(s) ?Signed: 06/05/2021 4:41:34 PM By: Levora Dredge ?Entered By: Levora Dredge on 06/05/2021  16:33:58 ?Laura Mccarty, Laura Mccarty ([id number]) ?-------------------------------------------------------------------------------- ?Encounter Discharge Information Details ?Patient Name: Laura Mccarty, Laura Mccarty ?Date of Service: 06/05/2021 2:15 PM ?Medical Record Number: [id number] ?Patient Account Number: [id number] ?Date of Birth/Sex: 1955-08-23 (66 y.o. F) ?Treating RN: Levora Dredge ?Primary Care Jheremy Boger: Dillard Cannon Other Clinician: ?Referring Milley Vining: Dillard Cannon ?Treating Garvey Westcott/Extender: Jeri Cos ?Weeks in Treatment: 6 ?Encounter Discharge Information Items Post Procedure Vitals ?Discharge Condition: Stable ?Temperature (?F): 97.7 ?Ambulatory Status: Wheelchair ?Pulse (bpm): 67 ?Discharge Destination: Home ?Respiratory Rate (breaths/min): 18 ?Transportation: Private Auto ?Blood Pressure (mmHg): 164/74 ?Accompanied By: husband ?Schedule Follow-up Appointment: Yes ?Clinical Summary of Care: Patient Declined ?Electronic Signature(s) ?Signed: 06/05/2021 4:35:41 PM By: Levora Dredge ?Entered By: Levora Dredge on 06/05/2021 16:35:41 ?Laura Mccarty, Laura Mccarty ([id number]) ?-------------------------------------------------------------------------------- ?Lower Extremity Assessment Details ?Patient Name: Laura Mccarty, Laura Mccarty ?Date of Service: 06/05/2021 2:15 PM ?Medical Record Number: [id number] ?Patient Account Number: [id number] ?Date of Birth/Sex: 29-Jul-1955 (66 y.o. F) ?Treating RN: Levora Dredge ?Primary Care Tanessa Tidd: Dillard Cannon Other Clinician: ?Referring Maven Rosander: Dillard Cannon ?Treating Itzael Liptak/Extender: Jeri Cos ?Weeks in Treatment: 6 ?Edema Assessment ?Assessed: [Left: No] [Right: No] ?Edema: [Left: Yes] [Right: Yes] ?Calf ?Left: Right: ?Point of Measurement: 34 cm From Medial Instep 68 cm 68 cm ?Ankle ?Left: Right: ?Point of  Measurement: 10 cm From Medial Instep 37 cm 38.2 cm ?Electronic Signature(s) ?Signed: 06/05/2021 4:22:54 PM By: Levora Dredge ?Entered By: Levora Dredge on 06/05/2021 14:24:43 ?Laura Mccarty, Laura Mccarty ([id number]) ?-------------------------------------------------------------------------------- ?Multi Wound Chart Details ?Patient Name: Laura Mccarty, Laura Mccarty ?Date of Service: 06/05/2021 2:15 PM ?Medical Record Number: [id number] ?Patient Account Number: [id number] ?Date of Birth/Sex: 1955/11/15 (66 y.o. F) ?Treating RN: Levora Dredge ?Primary Care Suheyla Mortellaro: Dillard Cannon Other Clinician: ?Referring Dawn Convery: Dillard Cannon ?Treating Mayank Teuscher/Extender: Jeri Cos ?Weeks in Treatment: 6 ?Vital Signs ?Height(in): 64 ?Pulse(bpm): 67 ?Weight(lbs): 360 ?Blood Pressure(mmHg): 164/74 ?Body Mass Index(BMI): 61.8 ?Temperature(??F): 97.7 ?Respiratory Rate(breaths/min): 18 ?Photos: [N/A:N/A] ?Wound Location: Right, Plantar Foot Left Calcaneus N/A ?Wounding Event: Gradually Appeared Gradually Appeared N/A ?Primary Etiology: Diabetic Wound/Ulcer of the Lower Lymphedema N/A ?Extremity ?Secondary Etiology: Lymphedema Pressure Ulcer N/A ?Comorbid History: Chronic sinus problems/congestion, Chronic sinus problems/congestion, N/A ?Lymphedema, Congestive Heart Lymphedema, Congestive Heart ?Failure, Hypertension, Type II Failure, Hypertension, Type II ?Diabetes, Neuropathy Diabetes, Neuropathy ?Date Acquired: 04/13/2020 05/03/2021 N/A ?Weeks of Treatment: 6 4 N/A ?Wound Status: Open Open N/A ?Wound Recurrence: No No N/A ?Clustered Wound: No Yes N/A ?Measurements L x W x D (cm) 0.9x0.6x0.2 3x5.5x0.1 N/A ?Area (cm?) : 0.424 12.959 N/A ?Volume (cm?) : 0.085 1.296 N/A ?% Reduction in Area: 46.00% -312.40% N/A ?% Reduction in Volume: 64.00% -312.70% N/A ?Classification: Grade 2 Partial Thickness N/A ?Exudate Amount: Medium Medium N/A ?Exudate Type: Serosanguineous Serosanguineous N/A ?Exudate Color: red, brown red, brown N/A ?Granulation Amount: Medium  (34-66%) Medium (34-66%) N/A ?Granulation Quality: Pink Pink N/A ?Necrotic Amount: Medium (34-66%) Medium (34-66%) N/A ?Exposed Structures: ?Fat Layer (Subcutaneous Tissue): ?Fat Layer (Subcutaneous Tissue): N/A ?Yes

## 2021-06-12 ENCOUNTER — Encounter: Payer: Medicare HMO | Admitting: Physician Assistant

## 2021-06-19 ENCOUNTER — Encounter: Payer: Medicare HMO | Admitting: Internal Medicine

## 2021-06-26 ENCOUNTER — Encounter: Payer: Medicare HMO | Attending: Physician Assistant | Admitting: Physician Assistant

## 2021-06-26 DIAGNOSIS — L97522 Non-pressure chronic ulcer of other part of left foot with fat layer exposed: Secondary | ICD-10-CM | POA: Insufficient documentation

## 2021-06-26 DIAGNOSIS — E1140 Type 2 diabetes mellitus with diabetic neuropathy, unspecified: Secondary | ICD-10-CM | POA: Diagnosis not present

## 2021-06-26 DIAGNOSIS — I5042 Chronic combined systolic (congestive) and diastolic (congestive) heart failure: Secondary | ICD-10-CM | POA: Insufficient documentation

## 2021-06-26 DIAGNOSIS — I890 Lymphedema, not elsewhere classified: Secondary | ICD-10-CM | POA: Insufficient documentation

## 2021-06-26 DIAGNOSIS — E11621 Type 2 diabetes mellitus with foot ulcer: Secondary | ICD-10-CM | POA: Diagnosis not present

## 2021-06-26 DIAGNOSIS — L97512 Non-pressure chronic ulcer of other part of right foot with fat layer exposed: Secondary | ICD-10-CM | POA: Diagnosis present

## 2021-06-26 DIAGNOSIS — I110 Hypertensive heart disease with heart failure: Secondary | ICD-10-CM | POA: Diagnosis not present

## 2021-06-26 NOTE — Progress Notes (Addendum)
Laura, Mccarty ([id number]) ?Visit Report for 06/26/2021 ?Chief Complaint Document Details ?Patient Name: Laura Mccarty, Laura Mccarty ?Date of Service: 06/26/2021 11:15 AM ?Medical Record Number: [id number] ?Patient Account Number: [id number] ?Date of Birth/Sex: 02-Dec-1955 (66 y.o. F) ?Treating RN: Levora Dredge ?Primary Care Provider: Dillard Cannon Other Clinician: ?Referring Provider: Dillard Cannon ?Treating Provider/Extender: Jeri Cos ?Weeks in Treatment: 9 ?Information Obtained from: Patient ?Chief Complaint ?Left foot ulcer ?Electronic Signature(s) ?Signed: 06/26/2021 11:06:42 AM By: Worthy Keeler PA-C ?Entered By: Worthy Keeler on 06/26/2021 11:06:42 ?Laura, Mccarty ([id number]) ?-------------------------------------------------------------------------------- ?Debridement Details ?Patient Name: Laura, Mccarty ?Date of Service: 06/26/2021 11:15 AM ?Medical Record Number: [id number] ?Patient Account Number: [id number] ?Date of Birth/Sex: 16-Aug-1955 (66 y.o. F) ?Treating RN: Levora Dredge ?Primary Care Provider: Dillard Cannon Other Clinician: ?Referring Provider: Dillard Cannon ?Treating Provider/Extender: Jeri Cos ?Weeks in Treatment: 9 ?Debridement Performed for ?Wound #1 Right,Plantar Foot ?Assessment: ?Performed By: Physician Tommie Sams., PA-C ?Debridement Type: Debridement ?Severity of Tissue Pre Debridement: Fat layer exposed ?Level of Consciousness (Pre- ?Awake and Alert ?procedure): ?Pre-procedure Verification/Time Out ?Yes - 11:40 ?Taken: ?Pain Control: Lidocaine 4% Topical Solution ?Total Area Debrided (L x W): 0.5 (cm) x 0.5 (cm) = 0.25 (cm?) ?Tissue and other material ?Viable, Non-Viable, Callus, Slough, Subcutaneous, Dell ?debrided: ?Level: Skin/Subcutaneous Tissue ?Debridement Description: Excisional ?Instrument: Curette ?Bleeding: Minimum ?Hemostasis Achieved: Pressure ?Response to Treatment: Procedure was tolerated well ?Level of Consciousness (Post- ?Awake and Alert ?procedure): ?Post  Debridement Measurements of Total Wound ?Length: (cm) 0.5 ?Width: (cm) 0.5 ?Depth: (cm) 0.2 ?Volume: (cm?) 0.039 ?Character of Wound/Ulcer Post Debridement: Stable ?Severity of Tissue Post Debridement: Fat layer exposed ?Post Procedure Diagnosis ?Same as Pre-procedure ?Electronic Signature(s) ?Signed: 06/26/2021 4:33:56 PM By: Levora Dredge ?Signed: 06/26/2021 5:23:27 PM By: Worthy Keeler PA-C ?Entered By: Levora Dredge on 06/26/2021 11:42:55 ?MANDE, AUVIL ([id number]) ?-------------------------------------------------------------------------------- ?HPI Details ?Patient Name: Laura, Mccarty ?Date of Service: 06/26/2021 11:15 AM ?Medical Record Number: [id number] ?Patient Account Number: [id number] ?Date of Birth/Sex: Jan 18, 1956 (66 y.o. F) ?Treating RN: Levora Dredge ?Primary Care Provider: Dillard Cannon Other Clinician: ?Referring Provider: Dillard Cannon ?Treating Provider/Extender: Jeri Cos ?Weeks in Treatment: 9 ?History of Present Illness ?HPI Description: 04/18/2021 upon evaluation today patient presents for initial inspection here in the clinic regarding issues that she has been ?having with her bilateral lower extremities and feet. The right plantar foot ulcer she has had for about a year she tells me. With that being said ?the left plantar foot ulcer which is actually more on the toe of the third toe and fifth toes are much more new in fact she was not aware that was ?there until we saw them today. Fortunately I do not see any evidence of active infection locally or systemically which is great news. No fever chills ?noted the patient does have several chronic major medical problems that do contribute to the issue going on at hand. ?Patient does have a history of diabetes mellitus type 2, lymphedema, hypertension, congestive heart failure, and chronic kidney disease stage III. ?She does have lymphedema pumps though she tells me she is not really able to use them due to how much the bother and  hurt her. ?3/14; patient presents for follow-up. She has home health who comes out twice weekly to help with dressing changes. She has no issues or ?complaints today. She denies signs of infection. ?3/22; patient presents for follow-up. She reports increased weeping to the left heel. She has lymphedema pumps but is currently not using them. ?She is scheduled to have ABIs on 3/29. She currently  denies signs of infection. ?05/08/2021 upon evaluation patient appears to be doing worse in regard to her right foot ulcer. The other wounds on her left foot and heel all ?seem to have closed she still has some weeping on the left heel region but this is not an open wound at this point. Fortunately I do not see any ?signs of infection here. On the right foot this is a different story I think we will get a probably need to see about looking into a culture today and ?then subsequently were also going to look into getting her started on an antibiotic as well. ?05/15/2021 Upon evaluation today patient appears to be doing well with regard to her right plantar foot ulcer. Overall I am very pleased with where ?we stand and I think she is doing much better even than last week. I do not see any signs of active infection locally or systemically at this time ?which is great news. No fevers, chills, nausea, vomiting, or diarrhea. ?05-22-2021 upon evaluation today patient's legs actually appear to be doing about the same. We did review her arterial study which showed ?triphasic waveforms and otherwise completely normal ABIs and TBI readings which is great news. She has no limitations here from the standpoint ?of healing. With that being said she does unfortunately continue to have issues here with some weeping on the lateral aspect of her ankle on the ?left. She also has a plantar foot wound on the right. ?05-29-2021 upon evaluation today patient appears to be doing well with regard to her right leg I really feel like this is showing signs of  significant ?improvement which is great news. There does not appear to be any evidence of active infection locally or systemically which is excellent as well. ?Overall I think that we are headed in the right direction. With that being said on the right foot I do see some evidence of some bruising around the ?edges of her wound this is also where she tells me she is hurting mostly. I believe this is likely due to pressure probably from when she is sitting ?up and does not have her feet elevated. Nonetheless we discussed some ways to try to prevent this from happening going forward one of the ?biggest I think is going to be utilizing a donut pillow in order to span the area where the wound is so it does not touch the floor at all. ?06-05-2021 upon evaluation today patient appears to be doing better in a lot of regards at this point in regard to her lower extremities. She does ?have some callus still to remove around the area of the foot plantar warts she does still have an open wound on the side. With that being said on ?the right lower extremity the area of weeping is actually doing significantly better which is great news. ?06-26-2021 upon evaluation today patient appears to be doing well with regard to her wound specially the plantar foot ulcer is actually doing ?significantly better which is great news. Fortunately there does not appear to be any evidence of active infection locally or systemically which is ?great news. No fevers, chills, nausea, vomiting, or diarrhea. Unfortunately she continues to have pain which I feel like may be more likely to be ?neuropathic in nature versus anything else as I see no evidence of wounds on her heels and her heels she tells me sometimes her 10 out of 10 ?pain when she walks. Otherwise she still has some weeping from the lower  extremities in general. ?Electronic Signature(s) ?Signed: 06/26/2021 1:05:57 PM By: Worthy Keeler PA-C ?Entered By: Worthy Keeler on 06/26/2021  13:05:57 ?DIANIA, CO ([id number]) ?-------------------------------------------------------------------------------- ?Physical Exam Details ?Patient Name: JEVAEH, SHAMS ?Date of Service: 06/26/2021 11:15 AM ?[facility]

## 2021-06-26 NOTE — Progress Notes (Addendum)
JALAYA, SARVER ([id number]) ?Visit Report for 06/26/2021 ?Arrival Information Details ?Patient Name: Laura Mccarty, Laura Mccarty ?Date of Service: 06/26/2021 11:15 AM ?Medical Record Number: [id number] ?Patient Account Number: [id number] ?Date of Birth/Sex: [birth year] (66 y.o. F) ?Treating RN: Levora Dredge ?Primary Care Davonne Baby: Dillard Cannon Other Clinician: ?Referring Navea Woodrow: Dillard Cannon ?Treating Shavy Beachem/Extender: Jeri Cos ?Weeks in Treatment: 9 ?Visit Information History Since Last Visit ?Added or deleted any medications: No ?Patient Arrived: Wheel Chair ?Any new allergies or adverse reactions: No ?Arrival Time: 11:07 ?Had a fall or experienced change in No ?Accompanied By: husband ?activities of daily living that may affect ?Transfer Assistance: EasyPivot Patient Lift ?risk of falls: ?Patient Identification Verified: Yes ?Hospitalized since last visit: No ?Secondary Verification Process Completed: Yes ?Has Dressing in Place as Prescribed: Yes ?Patient Requires Transmission-Based No ?Pain Present Now: No ?Precautions: ?Patient Has Alerts: Yes ?Patient Alerts: Patient on Blood Thinner ?type 2 diabetic ?partially blind ?05/10/21 ABI R 1.01 L ?0.9 ?Electronic Signature(s) ?Signed: 06/26/2021 4:33:56 PM By: Levora Dredge ?Entered By: Levora Dredge on 06/26/2021 11:11:09 ?Laura Mccarty, Laura Mccarty ([id number]) ?-------------------------------------------------------------------------------- ?Clinic Level of Care Assessment Details ?Patient Name: Laura Mccarty, Laura Mccarty ?Date of Service: 06/26/2021 11:15 AM ?Medical Record Number: [id number] ?Patient Account Number: [id number] ?Date of Birth/Sex: 08-01-1955 (66 y.o. F) ?Treating RN: Levora Dredge ?Primary Care Cashay Manganelli: Dillard Cannon Other Clinician: ?Referring Curstin Schmale: Dillard Cannon ?Treating Kamla Skilton/Extender: Jeri Cos ?Weeks in Treatment: 9 ?Clinic Level of Care Assessment Items ?TOOL 1 Quantity Score ?'[]'$  - Use when EandM and Procedure is performed on INITIAL visit  0 ?ASSESSMENTS - Nursing Assessment / Reassessment ?'[]'$  - General Physical Exam (combine w/ comprehensive assessment (listed just below) when performed on new ?0 ?pt. evals) ?'[]'$  - 0 ?Comprehensive Assessment (HX, ROS, Risk Assessments, Wounds Hx, etc.) ?ASSESSMENTS - Wound and Skin Assessment / Reassessment ?'[]'$  - Dermatologic / Skin Assessment (not related to wound area) 0 ?ASSESSMENTS - Ostomy and/or Continence Assessment and Care ?'[]'$  - Incontinence Assessment and Management 0 ?'[]'$  - 0 ?Ostomy Care Assessment and Management (repouching, etc.) ?PROCESS - Coordination of Care ?'[]'$  - Simple Patient / Family Education for ongoing care 0 ?'[]'$  - 0 ?Complex (extensive) Patient / Family Education for ongoing care ?'[]'$  - 0 ?Staff obtains Consents, Records, Test Results / Process Orders ?'[]'$  - 0 ?Staff telephones HHA, Nursing Homes / Clarify orders / etc ?'[]'$  - 0 ?Routine Transfer to another Facility (non-emergent condition) ?'[]'$  - 0 ?Routine Hospital Admission (non-emergent condition) ?'[]'$  - 0 ?New Admissions / Biomedical engineer / Ordering NPWT, Apligraf, etc. ?'[]'$  - 0 ?Emergency Hospital Admission (emergent condition) ?PROCESS - Special Needs ?'[]'$  - Pediatric / Minor Patient Management 0 ?'[]'$  - 0 ?Isolation Patient Management ?'[]'$  - 0 ?Hearing / Language / Visual special needs ?'[]'$  - 0 ?Assessment of Community assistance (transportation, D/C planning, etc.) ?'[]'$  - 0 ?Additional assistance / Altered mentation ?'[]'$  - 0 ?Support Surface(s) Assessment (bed, cushion, seat, etc.) ?INTERVENTIONS - Miscellaneous ?'[]'$  - External ear exam 0 ?'[]'$  - 0 ?Patient Transfer (multiple staff / Civil Service fast streamer / Similar devices) ?'[]'$  - 0 ?Simple Staple / Suture removal (25 or less) ?'[]'$  - 0 ?Complex Staple / Suture removal (26 or more) ?'[]'$  - 0 ?Hypo/Hyperglycemic Management (do not check if billed separately) ?'[]'$  - 0 ?Ankle / Brachial Index (ABI) - do not check if billed separately ?Has the patient been seen at the hospital within the last three years:  Yes ?Total Score: 0 ?Level Of Care: ____ ?IEISHA, GAO ([id number]) ?Electronic Signature(s) ?Signed: 06/26/2021 4:33:56 PM By: Levora Dredge ?Entered By: Levora Dredge on 06/26/2021  12:01:11 ?Laura Mccarty, Laura Mccarty ([id number]) ?-------------------------------------------------------------------------------- ?Encounter Discharge Information Details ?Patient Name: Laura Mccarty, Laura Mccarty ?Date of Service: 06/26/2021 11:15 AM ?Medical Record Number: [id number] ?Patient Account Number: [id number] ?Date of Birth/Sex: 08/01/1955 (66 y.o. F) ?Treating RN: Levora Dredge ?Primary Care Lexx Monte: Dillard Cannon Other Clinician: ?Referring Jimeka Balan: Dillard Cannon ?Treating Kenn Rekowski/Extender: Jeri Cos ?Weeks in Treatment: 9 ?Encounter Discharge Information Items Post Procedure Vitals ?Discharge Condition: Stable ?Temperature (?F): 97.7 ?Ambulatory Status: Wheelchair ?Pulse (bpm): 56 ?Discharge Destination: Home ?Respiratory Rate (breaths/min): 18 ?Transportation: Private Auto ?Blood Pressure (mmHg): 143/50 ?Accompanied By: husband ?Schedule Follow-up Appointment: Yes ?Clinical Summary of Care: Patient Declined ?Electronic Signature(s) ?Signed: 06/26/2021 4:33:56 PM By: Levora Dredge ?Entered By: Levora Dredge on 06/26/2021 12:02:07 ?Laura Mccarty, Laura Mccarty ([id number]) ?-------------------------------------------------------------------------------- ?Lower Extremity Assessment Details ?Patient Name: Laura Mccarty, Laura Mccarty ?Date of Service: 06/26/2021 11:15 AM ?Medical Record Number: [id number] ?Patient Account Number: [id number] ?Date of Birth/Sex: 1955-07-17 (66 y.o. F) ?Treating RN: Levora Dredge ?Primary Care Meshell Abdulaziz: Dillard Cannon Other Clinician: ?Referring Jaquelin Meaney: Dillard Cannon ?Treating Kimisha Eunice/Extender: Jeri Cos ?Weeks in Treatment: 9 ?Edema Assessment ?Assessed: [Left: No] [Right: No] ?Edema: [Left: Yes] [Right: Yes] ?Calf ?Left: Right: ?Point of Measurement: 34 cm From Medial Instep 73 cm 74.2 cm ?Ankle ?Left: Right: ?Point  of Measurement: 10 cm From Medial Instep 37.2 cm 38.7 cm ?Electronic Signature(s) ?Signed: 06/26/2021 4:33:56 PM By: Levora Dredge ?Entered By: Levora Dredge on 06/26/2021 11:31:36 ?Laura Mccarty, Laura Mccarty ([id number]) ?-------------------------------------------------------------------------------- ?Multi Wound Chart Details ?Patient Name: Laura Mccarty, Laura Mccarty ?Date of Service: 06/26/2021 11:15 AM ?Medical Record Number: [id number] ?Patient Account Number: [id number] ?Date of Birth/Sex: 03-Jan-1956 (66 y.o. F) ?Treating RN: Levora Dredge ?Primary Care Caralynn Gelber: Dillard Cannon Other Clinician: ?Referring Phinehas Grounds: Dillard Cannon ?Treating Yanky Vanderburg/Extender: Jeri Cos ?Weeks in Treatment: 9 ?Vital Signs ?Height(in): 64 ?Pulse(bpm): 56 ?Weight(lbs): 360 ?Blood Pressure(mmHg): 143/50 ?Body Mass Index(BMI): 61.8 ?Temperature(??F): 97.7 ?Respiratory Rate(breaths/min): 18 ?Photos: [N/A:N/A] ?Wound Location: Right, Plantar Foot Left Calcaneus N/A ?Wounding Event: Gradually Appeared Gradually Appeared N/A ?Primary Etiology: Diabetic Wound/Ulcer of the Lower Lymphedema N/A ?Extremity ?Secondary Etiology: Lymphedema Pressure Ulcer N/A ?Comorbid History: Chronic sinus problems/congestion, Chronic sinus problems/congestion, N/A ?Lymphedema, Congestive Heart Lymphedema, Congestive Heart ?Failure, Hypertension, Type II Failure, Hypertension, Type II ?Diabetes, Neuropathy Diabetes, Neuropathy ?Date Acquired: 04/13/2020 05/03/2021 N/A ?Weeks of Treatment: 9 7 N/A ?Wound Status: Open Open N/A ?Wound Recurrence: No No N/A ?Clustered Wound: No Yes N/A ?Measurements L x W x D (cm) 0.5x0.5x0.2 2.5x2.5x0.1 N/A ?Area (cm?) : 0.196 4.909 N/A ?Volume (cm?) : 0.039 0.491 N/A ?% Reduction in Area: 75.00% -56.20% N/A ?% Reduction in Volume: 83.50% -56.40% N/A ?Starting Position 1 (o'clock): 12 ?Ending Position 1 (o'clock): 12 ?Maximum Distance 1 (cm): 0.3 ?Undermining: Yes No N/A ?Classification: Grade 2 Partial Thickness N/A ?Exudate Amount: Medium  Medium N/A ?Exudate Type: Serosanguineous Serosanguineous N/A ?Exudate Color: red, brown red, brown N/A ?Granulation Amount: Medium (34-66%) Small (1-33%) N/A ?Granulation Quality: Pink Pink N/A ?Necrotic Amount: Mediu

## 2021-07-03 ENCOUNTER — Encounter: Payer: Medicare HMO | Admitting: Physician Assistant

## 2021-07-05 ENCOUNTER — Ambulatory Visit: Payer: Medicare HMO | Admitting: Podiatry

## 2021-07-15 ENCOUNTER — Encounter: Payer: Self-pay | Admitting: Pulmonary Disease

## 2021-07-15 ENCOUNTER — Inpatient Hospital Stay: Payer: Medicare HMO

## 2021-07-15 ENCOUNTER — Emergency Department: Payer: Medicare HMO

## 2021-07-15 ENCOUNTER — Inpatient Hospital Stay
Admission: EM | Admit: 2021-07-15 | Discharge: 2021-08-12 | DRG: 308 | Disposition: E | Payer: Medicare HMO | Attending: Pulmonary Disease | Admitting: Pulmonary Disease

## 2021-07-15 DIAGNOSIS — I469 Cardiac arrest, cause unspecified: Secondary | ICD-10-CM | POA: Diagnosis present

## 2021-07-15 DIAGNOSIS — S2243XA Multiple fractures of ribs, bilateral, initial encounter for closed fracture: Secondary | ICD-10-CM | POA: Diagnosis present

## 2021-07-15 DIAGNOSIS — Z20822 Contact with and (suspected) exposure to covid-19: Secondary | ICD-10-CM | POA: Diagnosis present

## 2021-07-15 DIAGNOSIS — J9811 Atelectasis: Secondary | ICD-10-CM | POA: Diagnosis present

## 2021-07-15 DIAGNOSIS — E872 Acidosis, unspecified: Secondary | ICD-10-CM | POA: Diagnosis present

## 2021-07-15 DIAGNOSIS — W07XXXA Fall from chair, initial encounter: Secondary | ICD-10-CM | POA: Diagnosis present

## 2021-07-15 DIAGNOSIS — K7200 Acute and subacute hepatic failure without coma: Secondary | ICD-10-CM | POA: Diagnosis present

## 2021-07-15 DIAGNOSIS — Z955 Presence of coronary angioplasty implant and graft: Secondary | ICD-10-CM

## 2021-07-15 DIAGNOSIS — N1831 Chronic kidney disease, stage 3a: Secondary | ICD-10-CM | POA: Diagnosis present

## 2021-07-15 DIAGNOSIS — R042 Hemoptysis: Secondary | ICD-10-CM | POA: Diagnosis present

## 2021-07-15 DIAGNOSIS — J90 Pleural effusion, not elsewhere classified: Secondary | ICD-10-CM | POA: Diagnosis present

## 2021-07-15 DIAGNOSIS — G40409 Other generalized epilepsy and epileptic syndromes, not intractable, without status epilepticus: Secondary | ICD-10-CM | POA: Diagnosis not present

## 2021-07-15 DIAGNOSIS — I462 Cardiac arrest due to underlying cardiac condition: Secondary | ICD-10-CM | POA: Diagnosis present

## 2021-07-15 DIAGNOSIS — S2220XA Unspecified fracture of sternum, initial encounter for closed fracture: Secondary | ICD-10-CM | POA: Diagnosis present

## 2021-07-15 DIAGNOSIS — I5032 Chronic diastolic (congestive) heart failure: Secondary | ICD-10-CM | POA: Diagnosis present

## 2021-07-15 DIAGNOSIS — E875 Hyperkalemia: Secondary | ICD-10-CM | POA: Diagnosis present

## 2021-07-15 DIAGNOSIS — Y92008 Other place in unspecified non-institutional (private) residence as the place of occurrence of the external cause: Secondary | ICD-10-CM

## 2021-07-15 DIAGNOSIS — E785 Hyperlipidemia, unspecified: Secondary | ICD-10-CM | POA: Diagnosis present

## 2021-07-15 DIAGNOSIS — R569 Unspecified convulsions: Secondary | ICD-10-CM | POA: Diagnosis present

## 2021-07-15 DIAGNOSIS — I890 Lymphedema, not elsewhere classified: Secondary | ICD-10-CM | POA: Diagnosis present

## 2021-07-15 DIAGNOSIS — Z66 Do not resuscitate: Secondary | ICD-10-CM | POA: Diagnosis present

## 2021-07-15 DIAGNOSIS — Z7982 Long term (current) use of aspirin: Secondary | ICD-10-CM

## 2021-07-15 DIAGNOSIS — I4901 Ventricular fibrillation: Principal | ICD-10-CM | POA: Diagnosis present

## 2021-07-15 DIAGNOSIS — Z7902 Long term (current) use of antithrombotics/antiplatelets: Secondary | ICD-10-CM

## 2021-07-15 DIAGNOSIS — G253 Myoclonus: Secondary | ICD-10-CM | POA: Diagnosis present

## 2021-07-15 DIAGNOSIS — I2510 Atherosclerotic heart disease of native coronary artery without angina pectoris: Secondary | ICD-10-CM | POA: Diagnosis present

## 2021-07-15 DIAGNOSIS — Z7984 Long term (current) use of oral hypoglycemic drugs: Secondary | ICD-10-CM

## 2021-07-15 DIAGNOSIS — J80 Acute respiratory distress syndrome: Secondary | ICD-10-CM | POA: Diagnosis present

## 2021-07-15 DIAGNOSIS — Z79899 Other long term (current) drug therapy: Secondary | ICD-10-CM

## 2021-07-15 DIAGNOSIS — I4891 Unspecified atrial fibrillation: Secondary | ICD-10-CM | POA: Diagnosis present

## 2021-07-15 DIAGNOSIS — Z9049 Acquired absence of other specified parts of digestive tract: Secondary | ICD-10-CM

## 2021-07-15 DIAGNOSIS — J690 Pneumonitis due to inhalation of food and vomit: Secondary | ICD-10-CM | POA: Diagnosis present

## 2021-07-15 DIAGNOSIS — G931 Anoxic brain damage, not elsewhere classified: Secondary | ICD-10-CM | POA: Diagnosis present

## 2021-07-15 DIAGNOSIS — I130 Hypertensive heart and chronic kidney disease with heart failure and stage 1 through stage 4 chronic kidney disease, or unspecified chronic kidney disease: Secondary | ICD-10-CM | POA: Diagnosis present

## 2021-07-15 DIAGNOSIS — E1122 Type 2 diabetes mellitus with diabetic chronic kidney disease: Secondary | ICD-10-CM | POA: Diagnosis present

## 2021-07-15 DIAGNOSIS — N179 Acute kidney failure, unspecified: Secondary | ICD-10-CM | POA: Diagnosis present

## 2021-07-15 DIAGNOSIS — Z515 Encounter for palliative care: Secondary | ICD-10-CM

## 2021-07-15 DIAGNOSIS — R570 Cardiogenic shock: Secondary | ICD-10-CM | POA: Diagnosis present

## 2021-07-15 LAB — CBC
HCT: 34 % — ABNORMAL LOW (ref 36.0–46.0)
Hemoglobin: 9.3 g/dL — ABNORMAL LOW (ref 12.0–15.0)
MCH: 32 pg (ref 26.0–34.0)
MCHC: 27.4 g/dL — ABNORMAL LOW (ref 30.0–36.0)
MCV: 116.8 fL — ABNORMAL HIGH (ref 80.0–100.0)
Platelets: 229 10*3/uL (ref 150–400)
RBC: 2.91 MIL/uL — ABNORMAL LOW (ref 3.87–5.11)
RDW: 15.8 % — ABNORMAL HIGH (ref 11.5–15.5)
WBC: 22.1 10*3/uL — ABNORMAL HIGH (ref 4.0–10.5)
nRBC: 0.2 % (ref 0.0–0.2)

## 2021-07-15 LAB — SARS CORONAVIRUS 2 BY RT PCR: SARS Coronavirus 2 by RT PCR: NEGATIVE

## 2021-07-15 LAB — LACTIC ACID, PLASMA
Lactic Acid, Venous: 3.8 mmol/L (ref 0.5–1.9)
Lactic Acid, Venous: 3.8 mmol/L (ref 0.5–1.9)

## 2021-07-15 LAB — COMPREHENSIVE METABOLIC PANEL
ALT: 28 U/L (ref 0–44)
AST: 60 U/L — ABNORMAL HIGH (ref 15–41)
Albumin: 2.8 g/dL — ABNORMAL LOW (ref 3.5–5.0)
Alkaline Phosphatase: 162 U/L — ABNORMAL HIGH (ref 38–126)
Anion gap: 4 — ABNORMAL LOW (ref 5–15)
BUN: 41 mg/dL — ABNORMAL HIGH (ref 8–23)
CO2: 15 mmol/L — ABNORMAL LOW (ref 22–32)
Calcium: 10.6 mg/dL — ABNORMAL HIGH (ref 8.9–10.3)
Chloride: 120 mmol/L — ABNORMAL HIGH (ref 98–111)
Creatinine, Ser: 1.88 mg/dL — ABNORMAL HIGH (ref 0.44–1.00)
GFR, Estimated: 29 mL/min — ABNORMAL LOW (ref >60)
Glucose, Bld: 155 mg/dL — ABNORMAL HIGH (ref 70–99)
Potassium: 7.2 mmol/L (ref 3.5–5.1)
Sodium: 139 mmol/L (ref 135–145)
Total Bilirubin: 1 mg/dL (ref 0.3–1.2)
Total Protein: 7.1 g/dL (ref 6.5–8.1)

## 2021-07-15 LAB — BLOOD GAS, ARTERIAL
Acid-base deficit: 16.1 mmol/L — ABNORMAL HIGH (ref 0.0–2.0)
Bicarbonate: 13.3 mmol/L — ABNORMAL LOW (ref 20.0–28.0)
FIO2: 100 %
MECHVT: 550 mL
Mechanical Rate: 22
O2 Saturation: 96.7 %
PEEP: 10 cmH2O
Patient temperature: 37
pCO2 arterial: 44 mmHg (ref 32–48)
pH, Arterial: 7.09 — CL (ref 7.35–7.45)
pO2, Arterial: 91 mmHg (ref 83–108)

## 2021-07-15 LAB — TYPE AND SCREEN
ABO/RH(D): O POS
Antibody Screen: NEGATIVE

## 2021-07-15 LAB — PROTIME-INR
INR: >10 (ref 0.8–1.2)
INR: 1.5 — ABNORMAL HIGH (ref 0.8–1.2)
Prothrombin Time: >90 seconds — ABNORMAL HIGH (ref 11.4–15.2)
Prothrombin Time: 17.9 seconds — ABNORMAL HIGH (ref 11.4–15.2)

## 2021-07-15 LAB — GLUCOSE, CAPILLARY
Glucose-Capillary: 111 mg/dL — ABNORMAL HIGH (ref 70–99)
Glucose-Capillary: 155 mg/dL — ABNORMAL HIGH (ref 70–99)

## 2021-07-15 LAB — PROCALCITONIN: Procalcitonin: 0.25 ng/mL

## 2021-07-15 LAB — MAGNESIUM: Magnesium: 2.7 mg/dL — ABNORMAL HIGH (ref 1.7–2.4)

## 2021-07-15 LAB — FIBRINOGEN: Fibrinogen: 358 mg/dL (ref 210–475)

## 2021-07-15 LAB — POTASSIUM: Potassium: 6 mmol/L — ABNORMAL HIGH (ref 3.5–5.1)

## 2021-07-15 LAB — APTT: aPTT: >160 seconds (ref 24–36)

## 2021-07-15 LAB — TSH: TSH: 6.537 u[IU]/mL — ABNORMAL HIGH (ref 0.350–4.500)

## 2021-07-15 LAB — HEMOGLOBIN AND HEMATOCRIT, BLOOD
HCT: 28.2 % — ABNORMAL LOW (ref 36.0–46.0)
Hemoglobin: 8.1 g/dL — ABNORMAL LOW (ref 12.0–15.0)

## 2021-07-15 LAB — D-DIMER, QUANTITATIVE: D-Dimer, Quant: >20 ug/mL-FEU — ABNORMAL HIGH (ref 0.00–0.50)

## 2021-07-15 LAB — CBG MONITORING, ED: Glucose-Capillary: 165 mg/dL — ABNORMAL HIGH (ref 70–99)

## 2021-07-15 LAB — TROPONIN I (HIGH SENSITIVITY): Troponin I (High Sensitivity): 166 ng/L (ref <18)

## 2021-07-15 LAB — BRAIN NATRIURETIC PEPTIDE: B Natriuretic Peptide: 553.1 pg/mL — ABNORMAL HIGH (ref 0.0–100.0)

## 2021-07-15 LAB — T4, FREE: Free T4: 1.58 ng/dL — ABNORMAL HIGH (ref 0.61–1.12)

## 2021-07-15 LAB — PHOSPHORUS: Phosphorus: 6.6 mg/dL — ABNORMAL HIGH (ref 2.5–4.6)

## 2021-07-15 MED ORDER — DEXTROSE 50 % IV SOLN
1.0000 | Freq: Once | INTRAVENOUS | Status: AC
  Administered 2021-07-16: 50 mL via INTRAVENOUS
  Filled 2021-07-15: qty 50

## 2021-07-15 MED ORDER — INSULIN ASPART 100 UNIT/ML IV SOLN
10.0000 [IU] | Freq: Once | INTRAVENOUS | Status: DC
Start: 2021-07-15 — End: 2021-07-15

## 2021-07-15 MED ORDER — NOREPINEPHRINE 16 MG/250ML-% IV SOLN
0.0000 ug/min | INTRAVENOUS | Status: DC
  Administered 2021-07-16: 25 ug/min via INTRAVENOUS
  Administered 2021-07-16: 15 ug/min via INTRAVENOUS
  Filled 2021-07-15 (×2): qty 250

## 2021-07-15 MED ORDER — INSULIN ASPART 100 UNIT/ML IV SOLN
10.0000 [IU] | Freq: Once | INTRAVENOUS | Status: AC
  Administered 2021-07-16: 10 [IU] via INTRAVENOUS
  Filled 2021-07-15: qty 0.1

## 2021-07-15 MED ORDER — PROPOFOL 1000 MG/100ML IV EMUL: 5.0000 ug/kg/min | INTRAVENOUS | Status: DC

## 2021-07-15 MED ORDER — FENTANYL 2500MCG IN NS 250ML (10MCG/ML) PREMIX INFUSION
0.0000 ug/h | INTRAVENOUS | Status: DC
  Administered 2021-07-15: 50 ug/h via INTRAVENOUS
  Filled 2021-07-15: qty 250

## 2021-07-15 MED ORDER — CALCIUM CHLORIDE 10 % IV SOLN
INTRAVENOUS | Status: AC | PRN
  Administered 2021-07-15: 1 g via INTRAVENOUS

## 2021-07-15 MED ORDER — MIDAZOLAM BOLUS VIA INFUSION: 1.0000 mg | INTRAVENOUS | Status: DC | PRN

## 2021-07-15 MED ORDER — DEXTROSE 50 % IV SOLN: 1.0000 | Freq: Once | INTRAVENOUS | Status: DC

## 2021-07-15 MED ORDER — SODIUM BICARBONATE 8.4 % IV SOLN
INTRAVENOUS | Status: AC
  Filled 2021-07-15: qty 50

## 2021-07-15 MED ORDER — DOCUSATE SODIUM 50 MG/5ML PO LIQD
100.0000 mg | Freq: Two times a day (BID) | ORAL | Status: DC
  Administered 2021-07-16: 100 mg
  Filled 2021-07-15: qty 10

## 2021-07-15 MED ORDER — SODIUM BICARBONATE 8.4 % IV SOLN
50.0000 meq | Freq: Once | INTRAVENOUS | Status: AC
  Administered 2021-07-15: 50 meq via INTRAVENOUS

## 2021-07-15 MED ORDER — POLYETHYLENE GLYCOL 3350 17 G PO PACK
17.0000 g | PACK | Freq: Every day | ORAL | Status: DC
Start: 2021-07-16 — End: 2021-07-16
  Filled 2021-07-15: qty 1

## 2021-07-15 MED ORDER — DEXTROSE 50 % IV SOLN
1.0000 | Freq: Once | INTRAVENOUS | Status: AC
  Administered 2021-07-15: 50 mL via INTRAVENOUS
  Filled 2021-07-15: qty 50

## 2021-07-15 MED ORDER — LEVETIRACETAM IN NACL 500 MG/100ML IV SOLN
500.0000 mg | Freq: Once | INTRAVENOUS | Status: AC
Start: 2021-07-15 — End: 2021-07-15
  Administered 2021-07-15: 500 mg via INTRAVENOUS
  Filled 2021-07-15 (×2): qty 100

## 2021-07-15 MED ORDER — FAMOTIDINE 40 MG/5ML PO SUSR
20.0000 mg | Freq: Two times a day (BID) | ORAL | Status: DC
  Administered 2021-07-15 – 2021-07-16 (×2): 20 mg
  Filled 2021-07-15 (×3): qty 2.5

## 2021-07-15 MED ORDER — MIDAZOLAM HCL 2 MG/2ML IJ SOLN
1.0000 mg | Freq: Once | INTRAMUSCULAR | Status: AC
  Administered 2021-07-15: 1 mg via INTRAVENOUS
  Filled 2021-07-15: qty 2

## 2021-07-15 MED ORDER — FENTANYL BOLUS VIA INFUSION: 50.0000 ug | INTRAVENOUS | Status: DC | PRN

## 2021-07-15 MED ORDER — POLYETHYLENE GLYCOL 3350 17 G PO PACK
17.0000 g | PACK | Freq: Every day | ORAL | Status: DC | PRN
Start: 2021-07-15 — End: 2021-07-16

## 2021-07-15 MED ORDER — SODIUM CHLORIDE 0.9 % IV SOLN
250.0000 mg | Freq: Two times a day (BID) | INTRAVENOUS | Status: DC
  Administered 2021-07-16: 250 mg via INTRAVENOUS
  Filled 2021-07-15: qty 2.5

## 2021-07-15 MED ORDER — SUCCINYLCHOLINE CHLORIDE 20 MG/ML IJ SOLN
INTRAMUSCULAR | Status: AC | PRN
  Administered 2021-07-15: 130 mg via INTRAVENOUS

## 2021-07-15 MED ORDER — STERILE WATER FOR INJECTION IV SOLN
INTRAVENOUS | Status: DC
  Filled 2021-07-15: qty 1000
  Filled 2021-07-15 (×2): qty 150
  Filled 2021-07-15 (×2): qty 1000

## 2021-07-15 MED ORDER — SODIUM BICARBONATE 8.4 % IV SOLN
INTRAVENOUS | Status: AC | PRN
  Administered 2021-07-15: 50 meq via INTRAVENOUS

## 2021-07-15 MED ORDER — NOREPINEPHRINE 4 MG/250ML-% IV SOLN
5.0000 ug/min | INTRAVENOUS | Status: DC
  Administered 2021-07-15 (×2): 30 ug/min via INTRAVENOUS
  Administered 2021-07-15: 15 ug/min via INTRAVENOUS
  Filled 2021-07-15 (×3): qty 250

## 2021-07-15 MED ORDER — CHLORHEXIDINE GLUCONATE 0.12% ORAL RINSE (MEDLINE KIT)
15.0000 mL | Freq: Two times a day (BID) | OROMUCOSAL | Status: DC
  Administered 2021-07-16 (×2): 15 mL via OROMUCOSAL

## 2021-07-15 MED ORDER — MIDAZOLAM-SODIUM CHLORIDE 100-0.9 MG/100ML-% IV SOLN
INTRAVENOUS | Status: AC
  Administered 2021-07-15: 4 mg/h via INTRAVENOUS
  Filled 2021-07-15: qty 100

## 2021-07-15 MED ORDER — EPINEPHRINE 1 MG/10ML IJ SOSY
PREFILLED_SYRINGE | INTRAMUSCULAR | Status: AC | PRN
  Administered 2021-07-15: 1 mg via INTRAVENOUS

## 2021-07-15 MED ORDER — PROPOFOL 1000 MG/100ML IV EMUL
0.0000 ug/kg/min | INTRAVENOUS | Status: DC
  Administered 2021-07-15: 15 ug/kg/min via INTRAVENOUS

## 2021-07-15 MED ORDER — VANCOMYCIN HCL 2000 MG/400ML IV SOLN
2000.0000 mg | Freq: Once | INTRAVENOUS | Status: AC
  Administered 2021-07-15: 2000 mg via INTRAVENOUS
  Filled 2021-07-15: qty 400

## 2021-07-15 MED ORDER — LACTATED RINGERS IV BOLUS
1.5000 mL | Freq: Once | INTRAVENOUS | Status: AC
  Administered 2021-07-15: 1.5 mL via INTRAVENOUS

## 2021-07-15 MED ORDER — VASOPRESSIN 20 UNITS/100 ML INFUSION FOR SHOCK
0.0000 [IU]/min | INTRAVENOUS | Status: DC
  Administered 2021-07-15: 0.04 [IU]/min via INTRAVENOUS
  Administered 2021-07-15: 0.03 [IU]/min via INTRAVENOUS
  Administered 2021-07-16 (×2): 0.04 [IU]/min via INTRAVENOUS
  Filled 2021-07-15 (×4): qty 100

## 2021-07-15 MED ORDER — ATROPINE SULFATE 1 MG/ML IJ SOLN
INTRAMUSCULAR | Status: AC | PRN
  Administered 2021-07-15: 1 mg via INTRAVENOUS

## 2021-07-15 MED ORDER — SODIUM ZIRCONIUM CYCLOSILICATE 5 G PO PACK
10.0000 g | PACK | Freq: Three times a day (TID) | ORAL | Status: DC
Start: 2021-07-16 — End: 2021-07-16
  Administered 2021-07-16 (×2): 10 g
  Filled 2021-07-15 (×2): qty 2

## 2021-07-15 MED ORDER — FUROSEMIDE 10 MG/ML IJ SOLN
60.0000 mg | Freq: Once | INTRAMUSCULAR | Status: DC
  Filled 2021-07-15: qty 8

## 2021-07-15 MED ORDER — ORAL CARE MOUTH RINSE
15.0000 mL | OROMUCOSAL | Status: DC
Start: 2021-07-16 — End: 2021-07-16
  Administered 2021-07-16 (×7): 15 mL via OROMUCOSAL

## 2021-07-15 MED ORDER — INSULIN ASPART 100 UNIT/ML IV SOLN
10.0000 [IU] | Freq: Once | INTRAVENOUS | Status: AC
  Administered 2021-07-15: 10 [IU] via INTRAVENOUS
  Filled 2021-07-15: qty 0.1

## 2021-07-15 MED ORDER — ALBUMIN HUMAN 25 % IV SOLN
25.0000 g | Freq: Four times a day (QID) | INTRAVENOUS | Status: AC
  Administered 2021-07-16 (×2): 25 g via INTRAVENOUS
  Filled 2021-07-15 (×2): qty 100

## 2021-07-15 MED ORDER — MIDAZOLAM-SODIUM CHLORIDE 100-0.9 MG/100ML-% IV SOLN
0.5000 mg/h | INTRAVENOUS | Status: DC
  Administered 2021-07-16: 10 mg/h via INTRAVENOUS
  Filled 2021-07-15: qty 100

## 2021-07-15 MED ORDER — PIPERACILLIN-TAZOBACTAM 3.375 G IVPB 30 MIN
3.3750 g | Freq: Once | INTRAVENOUS | Status: AC
Start: 2021-07-15 — End: 2021-07-15
  Administered 2021-07-15: 3.375 g via INTRAVENOUS
  Filled 2021-07-15: qty 50

## 2021-07-15 MED ORDER — MIDAZOLAM HCL 2 MG/2ML IJ SOLN
1.0000 mg | Freq: Once | INTRAMUSCULAR | Status: AC
  Administered 2021-07-15: 1 mg via INTRAVENOUS

## 2021-07-15 MED ORDER — INSULIN ASPART 100 UNIT/ML IJ SOLN
0.0000 [IU] | INTRAMUSCULAR | Status: DC
  Administered 2021-07-16: 3 [IU] via SUBCUTANEOUS
  Administered 2021-07-16: 2 [IU] via SUBCUTANEOUS
  Administered 2021-07-16: 3 [IU] via SUBCUTANEOUS
  Filled 2021-07-15 (×2): qty 1

## 2021-07-15 MED ORDER — DOCUSATE SODIUM 100 MG PO CAPS
100.0000 mg | ORAL_CAPSULE | Freq: Two times a day (BID) | ORAL | Status: DC | PRN
Start: 2021-07-15 — End: 2021-07-16

## 2021-07-15 NOTE — ED Notes (Signed)
RN called lab to ask about labs not resulting. And if they could send someone to draw the other labs as ER staff has stuck the patient multiple times and cannot get any blood. Lab states "It might be after shift change before they come as they only have one lab tech". This RN informed her this was a cardiac arrest patient and needed to be bumped to the front of her lab draw list.

## 2021-07-15 NOTE — ED Triage Notes (Signed)
BIB [facility] from   Paced  440 witnessed arrrest by husband. FD did one shock EMS 3 shocks. lost pulses twice then regained pulses.  King red for airway 20 L Hand 300 amio 4 epi  100/48 180BGL 2 stents history HX of DM

## 2021-07-15 NOTE — H&P (Incomplete)
   NAME:  Clifton Kovacic, MRN:  [id number], DOB:  [birth year], LOS: 0 ADMISSION DATE:  07/17/2021, CONSULTATION DATE: 07/23/2021 REFERRING MD: Dr. Kerman Passey, CHIEF COMPLAINT: Cardiac Arrest   History of Present Illness:  ***  Pertinent  Medical History  ***  Significant Hospital Events: Including procedures, antibiotic start and stop dates in addition to other pertinent events     Interim History / Subjective:  ***  Objective   Blood pressure 102/67, pulse (!) 46, temperature 98.9 F (37.2 C), resp. rate (!) 22, SpO2 94 %.    Vent Mode: AC FiO2 (%):  [100 %] 100 % Set Rate:  [22 bmp] 22 bmp Vt Set:  [550 mL] 550 mL PEEP:  [10 cmH20] 10 cmH20  No intake or output data in the 24 hours ending 08/07/2021 1856 There were no vitals filed for this visit.  Examination: General: *** HENT: *** Lungs: *** Cardiovascular: *** Abdomen: *** Extremities: *** Neuro: *** GU: ***  Resolved Hospital Problem list   ***  Assessment & Plan:  ***  Best Practice (right click and "Reselect all SmartList Selections" daily)   Diet/type: {diet type:25684} DVT prophylaxis: {anticoagulation (Optional):25687} GI prophylaxis: {XL:24401} Lines: {Central Venous Access:25771} Foley:  {Central Venous Access:25691} Code Status:  {Code Status:26939} Last date of multidisciplinary goals of care discussion [***]  Labs   CBC: Recent Labs  Lab 08/01/2021 1654  WBC 22.1*  HGB 9.3*  HCT 34.0*  MCV 116.8*  PLT 027    Basic Metabolic Panel: No results for input(s): NA, K, CL, CO2, GLUCOSE, BUN, CREATININE, CALCIUM, MG, PHOS in the last 168 hours. GFR: CrCl cannot be calculated (No successful lab value found.). Recent Labs  Lab 07/27/2021 1654  WBC 22.1*    Liver Function Tests: No results for input(s): AST, ALT, ALKPHOS, BILITOT, PROT, ALBUMIN in the last 168 hours. No results for input(s): LIPASE, AMYLASE in the last 168 hours. No results for input(s): AMMONIA in the last 168 hours.  ABG     Component Value Date/Time   PHART 7.09 (LL) 08/06/2021 1802   PCO2ART 44 08/10/2021 1802   PO2ART 91 08/08/2021 1802   HCO3 13.3 (L) 07/13/2021 1802   ACIDBASEDEF 16.1 (H) 07/31/2021 1802   O2SAT 96.7 07/17/2021 1802     Coagulation Profile: No results for input(s): INR, PROTIME in the last 168 hours.  Cardiac Enzymes: No results for input(s): CKTOTAL, CKMB, CKMBINDEX, TROPONINI in the last 168 hours.  HbA1C: No results found for: HGBA1C  CBG: No results for input(s): GLUCAP in the last 168 hours.  Review of Systems:   ***  Past Medical History:  She,  has no past medical history on file.   Surgical History:  *** The histories are not reviewed yet. Please review them in the "History" navigator section and refresh this [place].   Social History:      Family History:  Her family history is not on file.   Allergies Not on File   Home Medications  Prior to Admission medications   Not on File     Critical care time: ***

## 2021-07-15 NOTE — ED Notes (Signed)
Family at bedside.  Family advised pt fell to the floor on her belly. Family called 911 bc they were unable to get her up and roll her over. Pt had stopped breathing. Family was able to get her rolled over and he gave rescue breaths while hr waited on EMS to get there.   Pt had not been sick. She does have a large wound on the back of her right calf that had been bloody. They had been trying to convince her to come to the ER.

## 2021-07-15 NOTE — H&P (Addendum)
NAME:  Laura Mccarty, MRN:  [id number], DOB:  1955/02/27, LOS: 1 ADMISSION DATE:  07-19-21, CONSULTATION DATE:  07-19-2021 REFERRING MD:  Dr. Lenard Lance, CHIEF COMPLAINT:  cardiac arrest   History of Present Illness:  66 year old female presenting to [facility] ED from home via EMS as CPR in progress on 07-19-2021.  Pertinent history obtained per ED documentation and husband's report bedside.  Per the patient's husband she has been in her normal state of health.  She has had increasing bilateral swelling in her legs due to her lymphedema, with subsequent skin breakdown.  The patient has been sleeping in a recliner and has had difficulty mobilizing outside of lifting herself slightly off the chair for bedpan placement.  About a week ago she fell on her right knee and family had difficulty getting her back up off the floor.  Husband also reports loose stools for the last 2 days as well as a productive cough for the last day with possibly green/brown sputum.  He reports she is chronically dyspneic with exertion. He denies fever/chills, cough, chest pain or dizziness. Earlier today he had placed her on the bedpan in her recliner.  When she finished she lifted herself up slightly so he can remove the bedpan and clean her.  When she sat back down she partially missed the seat of the chair.  Her husband attempted to block her from falling but was unable to do so because of the weight imbalance.  She slid to the floor possibly hitting her head or shoulder on the chair, ending up on her stomach face down.  At this point she was still conversing with her husband, requested a pillow for her head.  Her husband called EMS and was trying to mobilize her onto her side or back when he realized she had stopped talking.  He noticed her fingers were blue, ran to get a neighbor, and between the 2 people they positioned her on her back.  Once on her back he noticed her lips were blue and gave 2 rescue breaths just as the fire department was  arriving.  Per EMS report to EDP, the fire department provided CPR and ACLS care on arrival with 1 defibrillation for ventricular fibrillation.  Upon EMS arrival the patient remained in ventricular fibrillation requiring defibrillation x 3.  At that point they achieved ROSC, King airway in place.  Patient noticed to be bradycardic, atropine given with decent response and just prior to arrival at [facility] ED, EMS began pacing the patient transcutaneously.  During ACLS in the field patient received epinephrine, calcium & sodium bicarbonate.   Of note patient recently saw Dr. Juliann Pares 5/19 with concerns of for bradycardia, found patient in a junctional rhythm and stopped carvedilol. ED course: Upon arrival patient pulseless and CPR initiated.  Pupils noted to be 3 mm bilaterally and sluggish.  ACLS started with epinephrine given, ROSC achieved within 2 minutes.  Patient started on Levophed drip, was emergently intubated requiring mechanical ventilatory support. Medications given: Sodium bicarbonate amp, Levophed and propofol drip started, succinylcholine, atropine, calcium chloride, epinephrine Initial Vitals: 98.8, 18, 56, 78/68 and SPO2 96% on BVM Significant labs: (Labs/ Imaging personally reviewed) I, Cheryll Cockayne Rust-Chester, AGACNP-BC, personally viewed and interpreted this ECG. EKG Interpretation: Date: Jul 19, 2021, EKG Time: 17:38, Rate: 50, Rhythm: Junctional, QRS Axis: LAD, Intervals: Junctional , ST/T Wave abnormalities: nonspecific T wave abnormalities, Narrative Interpretation: Junctional rhythm with nonspecific T wave abnormalities Chemistry: Na+: 139, K+: 7.2, BUN/Cr.: 41/ 1.88, Serum CO2/ AG: 15/4, Ca:  10.6, Cl: 120, Mg: 2.7, Alk phos: 162, AST: 60 Hematology: WBC: 22.1, Hgb: 9.3, plt: 229  Troponin: 166, BNP: pending, Lactic/ PCT: 3.8/ 0.25,  INR: >10, PT: >90, aptt: >160 TSH: 6.537, T4: 1.58 Fibrinogen: 358 D-dimer: pending COVID-19 & Influenza A/B: negative Resp. viral 20 pathogen panel:  pending ABG: 7.09/ 44/ 91/ 13.3  CXR Jul 29, 2021: Diffuse bilateral airspace opacities/consolidation, right greater than left with possible small right pleural effusion. CT head without contrast July 29, 2021: No acute intracranial abnormality CT chest/abdomen/pelvis without contrast 2021/07/29: Diffuse patchy airspace opacities with prominent consolidation within the peribronchovascular and dependent portions of the lungs.  Findings likely represent combination of pulmonary edema and infection/inflammation.  Trace bilateral pleural effusions.  At least small volume upper abdominal high density free fluid extending into the pelvis.  Finding could represent hemoperitoneum versus ascites with limited evaluation of this noncontrast study.  Acute anterior left 5-9 and right 3-7 rib fractures.  Acute nondisplaced sternal fracture.  Uterine fibroids. Discussed finding of small volume of abdominal free fluid with Dr. Londell Moh, the radiologist available who stated that f/u with oral contrast would be low yield and that finding appeared not acutely concerning upon his evaluation  PCCM consulted for admission due to cardiac arrest with acute respiratory failure requiring mechanical ventilatory and vasopressor support.  Pertinent  Medical History  T2DM HTN CKD 3a Lymphedema CAD s/p PCI with stent placement ICM Diabetic ulcers HFpEF (LVEF 60% HLD  Significant Hospital Events: Including procedures, antibiotic start and stop dates in addition to other pertinent events   2021-07-29: Admit to ICU s/p cardiac arrest requiring emergent intubation  Interim History / Subjective:  Patient unresponsive, with some myoclonic like intermittent upper body jerking.  No other response to verbal/painful stimuli noted.  Spoke with husband and family bedside discussed plan of care all questions and concerns answered at this time. Discussed imaging results as well as rib fractures and sternal fractures. Patient's husband decided he  would not want CPR performed if his wife's heart were to stop, but would want to continue ACLS medications & defibrillations as needed. Objective   Blood pressure (!) 99/42, pulse (!) 45, temperature (!) 96.1 F (35.6 C), resp. rate 10, height 5\' 6"  (1.676 m), weight (!) 185 kg, SpO2 100 %.    Vent Mode: PRVC FiO2 (%):  [100 %] 100 % Set Rate:  [22 bmp] 22 bmp Vt Set:  [550 mL] 550 mL PEEP:  [10 cmH20] 10 cmH20 Plateau Pressure:  [36 cmH20] 36 cmH20   Intake/Output Summary (Last 24 hours) at 07/25/2021 0433 Last data filed at 07/13/2021 0429 Gross per 24 hour  Intake 197.6 ml  Output --  Net 197.6 ml   Filed Weights   07/23/2021 0000  Weight: (!) 185 kg    Examination: General: Adult female, critically ill, lying in bed intubated & sedated requiring mechanical ventilation, NAD HEENT: MM pink/moist, anicteric, facial edema, atraumatic- appears to be bleeding from chronic wound on nose, neck supple Neuro: RASS: -5, unable to follow commands, PERRL +3 & sluggish CV: s1s2 RRR, junctional bradycardia in 40's on monitor, no r/m/g Pulm: Regular, non labored on PRVC @ 100% fio2 & PEEP 10, breath sounds coarse-BUL & diminished-BLL GI: soft, rounded, bs x 4 GU: foley in place, no urine Skin: limited exam, chronic lymphedema BLE, wound on Left heel and Right posterior calf      Extremities: warm/dry, pulses + 2 R/P, +1 edema noted bilateral hands, non pitting chronic edema BLE  Resolved Hospital Problem list  Assessment & Plan:  Cardiac arrest: initial rhythm V-fib in the field per EMS report  Circulatory shock Bilateral Rib Fractures & Sternal Fracture PMHx: CAD s/p PCI, HFpEF, ICM 25-35 minutes downtime, 6-12 min before CPR initiated, received 4 defibrillations, suspected anoxic injury  UDS: pending - candidate for Normothermia Protocol, initiate per protocol if Temp > 37.5 - assess CVP Q 4 h - Continue vasopressors: levophed, add vasopressin, to maintain MAP > 65 -Albumin  supplementation - Echocardiogram ordered - Trend troponin, lactic - unable to administer IV contrast due to renal function > BLE [place] to r/o DVT - not a candidate for systemic anticoagulation due to hemoptysis and supratherapeutic INR - Cardiology consulted, appreciate input - Hold preadmission medication: lisinopril & torsemide  Acute hypoxic respiratory failure secondary to compressive atelectasis & suffocation in the setting of a fall PTA  PMHx: morbid obesity - Ventilator settings: PRVC 8 mL/kg, 100% FiO2, 10 PEEP - Wean PEEP and FiO2 for sats greater than 90% - Plateau pressures less than 30 cm H20 - VAP bundle in place - Intermittent chest x-ray & ABG - Daily WUA/ SBT as tolerated - Ensure adequate pulmonary hygiene  - F/u cultures, trend PCT -Bronchodilators as needed  Lactic acidosis in the setting of cardiac arrest Leukocytosis secondary to suspected aspiration pneumonia and potential RLE wound infection in the setting of cardiac arrest Lactic: 3.8, Baseline PCT: 0.25, UA: Pending -1.5 L LR bolus initiated, f/u BNP - Supplemental oxygen as needed, to maintain SpO2 > 90% - f/u cultures, trend lactic/ PCT - Daily CBC, monitor WBC/ fever curve - IV antibiotics: Zosyn & vancomycin  - Persistent hypotension consider stress dose steroids  - consult to WOC for chronic leg wounds  AGMA Hyperkalemia Acute Kidney Injury superimposed on CKD Stage 3b secondary to shock in the setting of cardiac arrest Baseline Cr: 1.2-1.6, Cr on admission:1.88, K+ 7.2 - insulin, D 50, sodium bicarbonate amp, f/u K+ - sodium bicarbonate infusion ordered - Strict I/O's: alert provider if UOP < 0.5 mL/kg/hr - gentle IVF hydration  - Daily BMP, replace electrolytes PRN - Avoid nephrotoxic agents as able, ensure adequate renal perfusion  Supratherapeutic INR Hemoptysis Confirmed with husband that the patient has not been on any anticoagulation, only Plavix.  INR greater than 10, PT greater than 90,  APTT greater than 160 -Due to concern for DIC, fibrinogen and D-dimer sent. Fibrinogen normal, d-dimer elevated - f/u INR in 4 hours, if still elevated consider vitamin K - CT chest showed consolidation and small bilateral pleural effusions  Transaminitis secondary to shock liver vs infectious process - CT abdomen/pelvis negative for infectious process, suspect shock liver - Trend hepatic function - avoid hepatotoxic agents  Suspected new onset myoclonic activity secondary to suspected anoxic injury in the setting of prolonged cardiac arrest At risk for anoxic encephalopathy. 25-35 minutes downtime, 6-12 min before CPR initiated, received 4 defibrillations, Initial head CT: negative - Keppra load initiated per renal dosing protocol, followed by 250 mg twice daily -Valproic acid IV added as an adjunct as myoclonic jerking difficult to control with just Keppra and Versed - PAD protocol in place: (unable to use propofol due to hypotension) midazolam drip & fentanyl drip - RASS goal: -1 - EEG ordered -Neurochecks every 4 - repeat CT head - Neuro consulted, appreciate input  Type 2 Diabetes Mellitus Hemoglobin A1C: pending - Monitor CBG Q 4 hours - SSI moderate dosing - target range while in ICU: 140-180 - follow ICU hyper/hypo-glycemia protocol  Best Practice (  right click and "Reselect all SmartList Selections" daily)  Diet/type: NPO w/ meds via tube DVT prophylaxis: SCD GI prophylaxis: H2B Lines: Central line and yes and it is still needed Foley:  Yes, and it is still needed Code Status:  limited Last date of multidisciplinary goals of care discussion [07/15/21]  Labs   CBC: Recent Labs  Lab 07/15/21 1654 07/15/21 2257  WBC 22.1*  --   HGB 9.3* 8.1*  HCT 34.0* 28.2*  MCV 116.8*  --   PLT 229  --     Basic Metabolic Panel: Recent Labs  Lab 07/15/21 1810 07/15/21 2257  NA 139  --   K 7.2* 6.0*  CL 120*  --   CO2 15*  --   GLUCOSE 155*  --   BUN 41*  --    CREATININE 1.88*  --   CALCIUM 10.6*  --   MG 2.7*  --   PHOS 6.6*  --    GFR: Estimated Creatinine Clearance: 52.3 mL/min (A) (by C-G formula based on SCr of 1.88 mg/dL (H)). Recent Labs  Lab 07/15/21 1654 07/15/21 1917 07/15/21 2257  PROCALCITON  --  0.25  --   WBC 22.1*  --   --   LATICACIDVEN  --  3.8* 3.8*    Liver Function Tests: Recent Labs  Lab 07/15/21 1810  AST 60*  ALT 28  ALKPHOS 162*  BILITOT 1.0  PROT 7.1  ALBUMIN 2.8*   No results for input(s): LIPASE, AMYLASE in the last 168 hours. No results for input(s): AMMONIA in the last 168 hours.  ABG    Component Value Date/Time   PHART 7.2 (L) 07/15/2021 2257   PCO2ART 42 07/15/2021 2257   PO2ART 112 (H) 07/15/2021 2257   HCO3 16.4 (L) 07/15/2021 2257   ACIDBASEDEF 11.2 (H) 07/15/2021 2257   O2SAT 99 07/15/2021 2257     Coagulation Profile: Recent Labs  Lab 07/15/21 1810 07/15/21 2257  INR >10.0* 1.5*    Cardiac Enzymes: No results for input(s): CKTOTAL, CKMB, CKMBINDEX, TROPONINI in the last 168 hours.  HbA1C: No results found for: HGBA1C  CBG: Recent Labs  Lab 07/15/21 1920 07/15/21 2134 07/15/21 2337 07/21/21 0352  GLUCAP 165* 155* 111* 130*    Review of Systems:   UTA-patient intubated and unresponsive in the setting of recent cardiac arrest  Past Medical History:  She,  has no past medical history on file.  Merged chart reviewed  Surgical History:  Merged chart reviewed  Social History:  See merged chart  Family History:  Her family history is not on file.  Merged chart reviewed  Allergies Allergies  Allergen Reactions   Codeine Nausea Only     Home Medications  Prior to Admission medications   Not on File     Critical care time: 70 minutes       Betsey Holiday, AGACNP-BC Acute Care Nurse Practitioner [facility]   [phone] / [phone] Please see Amion for pager details.

## 2021-07-15 NOTE — ED Provider Notes (Addendum)
[facility] Provider Note    Event Date/Time   First MD Initiated Contact with Patient 08/04/2021 1656     (approximate)  History   Chief Complaint: Cardiac Arrest  HPI  Laura Mccarty is a 65 y.o. female with no known past medical history who presents to the emergency department CPR in progress.  According to EMS they responded to the call for unresponsiveness, witnessed arrest by husband.  Fire department did defibrillate x1 for ventricular fibrillation, EMS defibrillated x3 for ventricular fibrillation.  State they regained pulses just prior to arrival and started pacing the patient.  They state four epi given prior to arrival.  On arrival patient noted to be pulseless and CPR initiated.  Patient has a King airway in place.  3 mm pupils sluggishly responsive.  Physical Exam   Triage Vital Signs: ED Triage Vitals [07/25/2021 1700]  Enc Vitals Group     BP      Pulse      Resp      Temp      Temp src      SpO2 96 %     Weight      Height      Head Circumference      Peak Flow      Pain Score      Pain Loc      Pain Edu?      Excl. in [place]?     Most recent vital signs: Vitals:   08/01/2021 1700  SpO2: 96%    General: Unresponsive, no response to painful stimuli. CV:  Patient has no pulse on arrival, good pulse with compressions. Resp:  Good chest rise with bagging with King airway.  Appears to have equal lung sounds. Abd:  Soft, no significant distention.  Obese. Other:  Patient appears to have lower extremity wraps likely for chronic peripheral edema.   ED Results / Procedures / Treatments   EKG  EKG viewed and interpreted by myself appear to show a junctional rhythm around 60 bpm with a widened QRS, left axis deviation, largely normal intervals no significant ST elevation noted although slight elevation in V3 no reciprocal changes or continuous leads showing elevation.  RADIOLOGY  I have reviewed the chest x-ray images.  Appears to have  diffuse pulmonary edema, but well-placed ET tube. Chest x-ray shows diffuse bilateral airspace opacities.  Endotracheal tube 2.5 cm above carina Abdominal x-ray shows og tube just proximal to the stomach.   MEDICATIONS ORDERED IN ED: Medications  propofol (DIPRIVAN) 1000 MG/100ML infusion (15 mcg/kg/min Intravenous New Bag/Given 08/05/2021 1710)  norepinephrine (LEVOPHED) '4mg'$  in 272m (0.016 mg/mL) premix infusion (15 mcg/min Intravenous New Bag/Given 07/14/2021 1707)  EPINEPHrine (ADRENALIN) 1 MG/10ML injection (1 mg Intravenous Given 07/25/2021 1644)  sodium bicarbonate injection (50 mEq Intravenous Given 08/01/2021 1645)  calcium chloride injection (1 g Intravenous Given 07/18/2021 1646)  succinylcholine (ANECTINE) injection (130 mg Intravenous Given 07/29/2021 1647)  EPINEPHrine (ADRENALIN) 1 MG/10ML injection (1 mg Intravenous Given 07/18/2021 1648)  atropine injection (1 mg Intravenous Given 08/03/2021 1646)     IMPRESSION / MDM / ASSESSMENT AND PLAN / ED COURSE  I reviewed the triage vital signs and the nursing notes.  Patient's presentation is most consistent with acute presentation with potential threat to life or bodily function.  Patient presents to the emergency department status post resuscitation being paced per EMS however upon arrival patient has no pulse and CPR was reinitiated.  King airway in place.  Patient had  received 4 epinephrine boluses prior to arrival had been defibrillated 4 times each time being ventricular fibrillation per EMS.  Patient was given additional medications in the emergency department including epinephrine, calcium, bicarbonate.  Patient had multiple rounds of epinephrine ultimately did regain a pulse within several minutes of arrival to the emergency department.  Noted fibrillation in the emergency department used.  Patient found to be bradycardic after several minutes of ROSC.  Patient was given atropine with decent response.  Ultimately patient did lose pulse again and given an  additional milligram of epinephrine CPR restarted patient regained pulses within 2 minutes.  Patient started on norepinephrine drip.  Patient Edison Pace airway was replaced and intubated by myself.  Patient started on propofol.  I updated the family.  We will admit to the ICU.  CBC shows significant leukocytosis 22,000 hemoglobin 9.3.  Remaining labs are pending.  INTUBATION Performed by: Harvest Dark  Required items: required blood products, implants, devices, and special equipment available Patient identity confirmed: provided demographic data and hospital-assigned identification number Time out: Immediately prior to procedure a "time out" was called to verify the correct patient, procedure, equipment, support staff and site/side marked as required.  Indications: Cardiac arrest  Intubation method: S4 Glidescope Laryngoscopy   Preoxygenation: 100% BVM  Sedatives: 30 mg etomidate Paralytic: 130 mg succinylcholine  Tube Size: 7.5 cuffed  Post-procedure assessment: chest rise and ETCO2 monitor Breath sounds: equal and absent over the epigastrium Tube secured with: ETT holder Chest x-ray interpreted by radiologist and me.  Chest x-ray findings: endotracheal tube in appropriate position  Cardiopulmonary Resuscitation (CPR) Procedure Note Directed/Performed by: Harvest Dark I personally directed ancillary staff and/or performed CPR in an effort to regain return of spontaneous circulation and to maintain cardiac, neuro and systemic perfusion.   CRITICAL CARE Performed by: Harvest Dark   Total critical care time: 45 minutes  Critical care time was exclusive of separately billable procedures and treating other patients.  Critical care was necessary to treat or prevent imminent or life-threatening deterioration.  Critical care was time spent personally by me on the following activities: development of treatment plan with patient and/or surrogate as well as nursing,  discussions with consultants, evaluation of patient's response to treatment, examination of patient, obtaining history from patient or surrogate, ordering and performing treatments and interventions, ordering and review of laboratory studies, ordering and review of radiographic studies, pulse oximetry and re-evaluation of patient's condition.   I spoke with Dr. Lanney Gins regarding the patient of the intensive care unit.  He will be admitting to his service.  FINAL CLINICAL IMPRESSION(S) / ED DIAGNOSES    Cardiac arrest Resuscitation, successful    Note:  This document was prepared using Dragon voice recognition software and may include unintentional dictation errors.   Harvest Dark, MD 07/25/2021 1805    Harvest Dark, MD 07/30/2021 1805    Harvest Dark, MD 08/10/2021 [phone]

## 2021-07-15 NOTE — ED Notes (Signed)
Lost pulses.... CPR started again

## 2021-07-15 NOTE — ED Notes (Signed)
ICU provider at bedside. Said to DC prop.

## 2021-07-15 NOTE — ED Notes (Signed)
443pm lost pulse... restart CPR  444pm epi  445pm bicarb switch CPR roles  446 calcium  447 130 succ switch CPR roles  448 tube swap... 7.5 tube color chage  448 epi  449 pulse check - pulses confirmed   456 atropine 1

## 2021-07-15 NOTE — ED Notes (Signed)
Lab at bedside

## 2021-07-15 NOTE — Procedures (Signed)
Central Venous Catheter Insertion Procedure Note  Laura Mccarty  OI:[id number]  [birth year]  Date:07/15/21  Time:10:40 PM   Provider Performing:Adeana Grilliot L Rust-Chester   Procedure: Insertion of Non-tunneled Central Venous [id number] with US guidance [id number]   Indication(s) Medication administration  Consent Risks of the procedure as well as the alternatives and risks of each were explained to the patient and/or caregiver.  Consent for the procedure was obtained and is signed in the bedside chart  Anesthesia Topical only with 1% lidocaine , fentanyl & versed drips  Timeout Verified patient identification, verified procedure, site/side was marked, verified correct patient position, special equipment/implants available, medications/allergies/relevant history reviewed, required imaging and test results available.  Sterile Technique Maximal sterile technique including full sterile barrier drape, hand hygiene, sterile gown, sterile gloves, mask, hair covering, sterile ultrasound probe cover (if used).  Procedure Description Area of catheter insertion was cleaned with chlorhexidine and draped in sterile fashion.  With real-time ultrasound guidance a central venous catheter was placed into the left internal jugular vein. Nonpulsatile blood flow and easy flushing noted in all ports.  The catheter was sutured in place and sterile dressing applied.  Complications/Tolerance None; patient tolerated the procedure well. Chest X-ray is ordered to verify placement for internal jugular or subclavian cannulation.   Chest x-ray is not ordered for femoral cannulation.  EBL Minimal  Specimen(s) None  Laura Mccarty, AGACNP-BC Acute Care Nurse Practitioner [facility]   [phone] / [phone] Please see Amion for pager details.

## 2021-07-16 ENCOUNTER — Inpatient Hospital Stay: Payer: Medicare HMO

## 2021-07-16 DIAGNOSIS — G253 Myoclonus: Secondary | ICD-10-CM

## 2021-07-16 DIAGNOSIS — G931 Anoxic brain damage, not elsewhere classified: Secondary | ICD-10-CM | POA: Diagnosis not present

## 2021-07-16 DIAGNOSIS — G40409 Other generalized epilepsy and epileptic syndromes, not intractable, without status epilepticus: Secondary | ICD-10-CM | POA: Diagnosis not present

## 2021-07-16 DIAGNOSIS — I469 Cardiac arrest, cause unspecified: Secondary | ICD-10-CM | POA: Diagnosis not present

## 2021-07-16 LAB — URINE DRUG SCREEN, QUALITATIVE (ARMC ONLY)
Amphetamines, Ur Screen: NOT DETECTED
Barbiturates, Ur Screen: NOT DETECTED
Benzodiazepine, Ur Scrn: POSITIVE — AB
Cannabinoid 50 Ng, Ur ~~LOC~~: NOT DETECTED
Cocaine Metabolite,Ur ~~LOC~~: NOT DETECTED
MDMA (Ecstasy)Ur Screen: NOT DETECTED
Methadone Scn, Ur: NOT DETECTED
Opiate, Ur Screen: NOT DETECTED
Phencyclidine (PCP) Ur S: NOT DETECTED
Tricyclic, Ur Screen: NOT DETECTED

## 2021-07-16 LAB — BLOOD GAS, VENOUS
Acid-base deficit: 11 mmol/L — ABNORMAL HIGH (ref 0.0–2.0)
Acid-base deficit: 14.7 mmol/L — ABNORMAL HIGH (ref 0.0–2.0)
Bicarbonate: 17.5 mmol/L — ABNORMAL LOW (ref 20.0–28.0)
Bicarbonate: 14 mmol/L — ABNORMAL LOW (ref 20.0–28.0)
FIO2: 100 %
MECHVT: 470 mL
Mechanical Rate: 22
O2 Saturation: 87.7 %
O2 Saturation: 96.9 %
PEEP: 10 cmH2O
Patient temperature: 34.2
Patient temperature: 37
pCO2, Ven: 42 mmHg — ABNORMAL LOW (ref 44–60)
pCO2, Ven: 42 mmHg — ABNORMAL LOW (ref 44–60)
pH, Ven: 7.21 — ABNORMAL LOW (ref 7.25–7.43)
pH, Ven: 7.13 — CL (ref 7.25–7.43)
pO2, Ven: 46 mmHg — ABNORMAL HIGH (ref 32–45)
pO2, Ven: 83 mmHg — ABNORMAL HIGH (ref 32–45)

## 2021-07-16 LAB — RESPIRATORY PANEL BY PCR

## 2021-07-16 LAB — BASIC METABOLIC PANEL
Anion gap: 5 (ref 5–15)
Anion gap: 6 (ref 5–15)
BUN: 45 mg/dL — ABNORMAL HIGH (ref 8–23)
BUN: 44 mg/dL — ABNORMAL HIGH (ref 8–23)
CO2: 17 mmol/L — ABNORMAL LOW (ref 22–32)
CO2: 18 mmol/L — ABNORMAL LOW (ref 22–32)
Calcium: 8.3 mg/dL — ABNORMAL LOW (ref 8.9–10.3)
Calcium: 8.4 mg/dL — ABNORMAL LOW (ref 8.9–10.3)
Chloride: 116 mmol/L — ABNORMAL HIGH (ref 98–111)
Chloride: 114 mmol/L — ABNORMAL HIGH (ref 98–111)
Creatinine, Ser: 1.95 mg/dL — ABNORMAL HIGH (ref 0.44–1.00)
Creatinine, Ser: 2.03 mg/dL — ABNORMAL HIGH (ref 0.44–1.00)
GFR, Estimated: 28 mL/min — ABNORMAL LOW (ref >60)
GFR, Estimated: 27 mL/min — ABNORMAL LOW (ref >60)
Glucose, Bld: 143 mg/dL — ABNORMAL HIGH (ref 70–99)
Glucose, Bld: 232 mg/dL — ABNORMAL HIGH (ref 70–99)
Potassium: 5.7 mmol/L — ABNORMAL HIGH (ref 3.5–5.1)
Potassium: 6.1 mmol/L — ABNORMAL HIGH (ref 3.5–5.1)
Sodium: 138 mmol/L (ref 135–145)
Sodium: 138 mmol/L (ref 135–145)

## 2021-07-16 LAB — HEMOGLOBIN A1C
Hgb A1c MFr Bld: 5.9 % — ABNORMAL HIGH (ref 4.8–5.6)
Mean Plasma Glucose: 122.63 mg/dL

## 2021-07-16 LAB — HEPATIC FUNCTION PANEL
ALT: 27 U/L (ref 0–44)
AST: 59 U/L — ABNORMAL HIGH (ref 15–41)
Albumin: 2.7 g/dL — ABNORMAL LOW (ref 3.5–5.0)
Alkaline Phosphatase: 125 U/L (ref 38–126)
Bilirubin, Direct: 0.6 mg/dL — ABNORMAL HIGH (ref 0.0–0.2)
Indirect Bilirubin: 0.9 mg/dL (ref 0.3–0.9)
Total Bilirubin: 1.5 mg/dL — ABNORMAL HIGH (ref 0.3–1.2)
Total Protein: 6 g/dL — ABNORMAL LOW (ref 6.5–8.1)

## 2021-07-16 LAB — URINALYSIS, ROUTINE W REFLEX MICROSCOPIC
Bilirubin Urine: NEGATIVE
Glucose, UA: NEGATIVE mg/dL
Ketones, ur: NEGATIVE mg/dL
Nitrite: NEGATIVE
Protein, ur: 100 mg/dL — AB
Specific Gravity, Urine: 1.013 (ref 1.005–1.030)
WBC, UA: >50 WBC/hpf — ABNORMAL HIGH (ref 0–5)
pH: 5 (ref 5.0–8.0)

## 2021-07-16 LAB — PHOSPHORUS: Phosphorus: 4.4 mg/dL (ref 2.5–4.6)

## 2021-07-16 LAB — PROCALCITONIN: Procalcitonin: 10.27 ng/mL

## 2021-07-16 LAB — LACTIC ACID, PLASMA
Lactic Acid, Venous: 3.8 mmol/L (ref 0.5–1.9)
Lactic Acid, Venous: 3.5 mmol/L (ref 0.5–1.9)
Lactic Acid, Venous: 4.3 mmol/L (ref 0.5–1.9)

## 2021-07-16 LAB — CBC
HCT: 28.5 % — ABNORMAL LOW (ref 36.0–46.0)
Hemoglobin: 8.3 g/dL — ABNORMAL LOW (ref 12.0–15.0)
MCH: 31.8 pg (ref 26.0–34.0)
MCHC: 29.1 g/dL — ABNORMAL LOW (ref 30.0–36.0)
MCV: 109.2 fL — ABNORMAL HIGH (ref 80.0–100.0)
Platelets: 202 10*3/uL (ref 150–400)
RBC: 2.61 MIL/uL — ABNORMAL LOW (ref 3.87–5.11)
RDW: 15.5 % (ref 11.5–15.5)
WBC: 17.4 10*3/uL — ABNORMAL HIGH (ref 4.0–10.5)
nRBC: 0 % (ref 0.0–0.2)

## 2021-07-16 LAB — GLUCOSE, CAPILLARY
Glucose-Capillary: 130 mg/dL — ABNORMAL HIGH (ref 70–99)
Glucose-Capillary: 137 mg/dL — ABNORMAL HIGH (ref 70–99)
Glucose-Capillary: 178 mg/dL — ABNORMAL HIGH (ref 70–99)

## 2021-07-16 LAB — MAGNESIUM: Magnesium: 2.2 mg/dL (ref 1.7–2.4)

## 2021-07-16 LAB — HIV ANTIBODY (ROUTINE TESTING W REFLEX): HIV Screen 4th Generation wRfx: NONREACTIVE

## 2021-07-16 LAB — TROPONIN I (HIGH SENSITIVITY)
Troponin I (High Sensitivity): 904 ng/L (ref <18)
Troponin I (High Sensitivity): 755 ng/L (ref <18)
Troponin I (High Sensitivity): 669 ng/L (ref <18)

## 2021-07-16 LAB — MRSA NEXT GEN BY PCR, NASAL: MRSA by PCR Next Gen: NOT DETECTED

## 2021-07-16 MED ORDER — INSULIN ASPART 100 UNIT/ML IV SOLN
10.0000 [IU] | Freq: Once | INTRAVENOUS | Status: AC
  Administered 2021-07-16: 10 [IU] via INTRAVENOUS
  Filled 2021-07-16: qty 0.1

## 2021-07-16 MED ORDER — VANCOMYCIN VARIABLE DOSE PER UNSTABLE RENAL FUNCTION (PHARMACIST DOSING): Status: DC

## 2021-07-16 MED ORDER — HALOPERIDOL LACTATE 5 MG/ML IJ SOLN: 0.5000 mg | INTRAMUSCULAR | Status: DC | PRN

## 2021-07-16 MED ORDER — PROPOFOL 1000 MG/100ML IV EMUL
5.0000 ug/kg/min | INTRAVENOUS | Status: DC
  Administered 2021-07-16: 10 ug/kg/min via INTRAVENOUS
  Filled 2021-07-16: qty 100

## 2021-07-16 MED ORDER — SODIUM BICARBONATE 8.4 % IV SOLN
100.0000 meq | Freq: Once | INTRAVENOUS | Status: AC
  Administered 2021-07-16: 100 meq via INTRAVENOUS
  Filled 2021-07-16: qty 50

## 2021-07-16 MED ORDER — CHLORHEXIDINE GLUCONATE CLOTH 2 % EX PADS: 6.0000 | MEDICATED_PAD | Freq: Every day | CUTANEOUS | Status: DC

## 2021-07-16 MED ORDER — VANCOMYCIN HCL 1250 MG/250ML IV SOLN
1250.0000 mg | INTRAVENOUS | Status: DC
  Filled 2021-07-16: qty 250

## 2021-07-16 MED ORDER — IPRATROPIUM-ALBUTEROL 0.5-2.5 (3) MG/3ML IN SOLN
3.0000 mL | RESPIRATORY_TRACT | Status: DC
Start: 2021-07-16 — End: 2021-07-16
  Administered 2021-07-16 (×3): 3 mL via RESPIRATORY_TRACT
  Filled 2021-07-16 (×3): qty 3

## 2021-07-16 MED ORDER — IPRATROPIUM-ALBUTEROL 0.5-2.5 (3) MG/3ML IN SOLN: 3.0000 mL | Freq: Four times a day (QID) | RESPIRATORY_TRACT | Status: DC | PRN

## 2021-07-16 MED ORDER — METHYLPREDNISOLONE SODIUM SUCC 40 MG IJ SOLR
40.0000 mg | Freq: Two times a day (BID) | INTRAMUSCULAR | Status: DC
  Administered 2021-07-16: 40 mg via INTRAVENOUS
  Filled 2021-07-16: qty 1

## 2021-07-16 MED ORDER — EPINEPHRINE 1 MG/10ML IJ SOSY
1.0000 mg | PREFILLED_SYRINGE | Freq: Once | INTRAMUSCULAR | Status: AC
Start: 2021-07-16 — End: 2021-07-16
  Administered 2021-07-16: 1 mg via INTRAVENOUS

## 2021-07-16 MED ORDER — DOPAMINE-DEXTROSE 3.2-5 MG/ML-% IV SOLN
0.0000 ug/kg/min | INTRAVENOUS | Status: DC
  Administered 2021-07-16: 2.8829 ug/kg/min via INTRAVENOUS

## 2021-07-16 MED ORDER — DEXTROSE 50 % IV SOLN
1.0000 | Freq: Once | INTRAVENOUS | Status: AC
Start: 2021-07-16 — End: 2021-07-16
  Administered 2021-07-16: 50 mL via INTRAVENOUS
  Filled 2021-07-16: qty 50

## 2021-07-16 MED ORDER — SODIUM CHLORIDE 0.9% FLUSH: 10.0000 mL | Freq: Two times a day (BID) | INTRAVENOUS | Status: DC

## 2021-07-16 MED ORDER — MIDAZOLAM-SODIUM CHLORIDE 100-0.9 MG/100ML-% IV SOLN
0.5000 mg/h | INTRAVENOUS | Status: DC
  Administered 2021-07-16: 5 mg/h via INTRAVENOUS

## 2021-07-16 MED ORDER — EPINEPHRINE PF 1 MG/ML IJ SOLN
INTRAMUSCULAR | Status: AC
  Filled 2021-07-16: qty 1

## 2021-07-16 MED ORDER — MORPHINE BOLUS VIA INFUSION: 1.0000 mg | INTRAVENOUS | Status: DC | PRN

## 2021-07-16 MED ORDER — LORAZEPAM 2 MG/ML IJ SOLN
1.0000 mg | INTRAMUSCULAR | Status: DC | PRN
  Administered 2021-07-16: 4 mg via INTRAVENOUS
  Filled 2021-07-16: qty 2

## 2021-07-16 MED ORDER — SODIUM BICARBONATE 8.4 % IV SOLN
50.0000 meq | Freq: Once | INTRAVENOUS | Status: AC
  Administered 2021-07-16: 50 meq via INTRAVENOUS

## 2021-07-16 MED ORDER — ATROPINE SULFATE 1 MG/ML IV SOLN
1.0000 mg | Freq: Once | INTRAVENOUS | Status: AC
  Administered 2021-07-16: 1 mg via INTRAVENOUS
  Filled 2021-07-16: qty 1

## 2021-07-16 MED ORDER — IPRATROPIUM-ALBUTEROL 0.5-2.5 (3) MG/3ML IN SOLN
9.0000 mL | Freq: Once | RESPIRATORY_TRACT | Status: AC
Start: 2021-07-16 — End: 2021-07-16
  Administered 2021-07-16: 9 mL via RESPIRATORY_TRACT
  Filled 2021-07-16: qty 9

## 2021-07-16 MED ORDER — LEVETIRACETAM IN NACL 500 MG/100ML IV SOLN
500.0000 mg | Freq: Two times a day (BID) | INTRAVENOUS | Status: DC
  Administered 2021-07-16: 500 mg via INTRAVENOUS
  Filled 2021-07-16 (×2): qty 100

## 2021-07-16 MED ORDER — ONDANSETRON HCL 4 MG/2ML IJ SOLN: 4.0000 mg | Freq: Four times a day (QID) | INTRAMUSCULAR | Status: DC | PRN

## 2021-07-16 MED ORDER — POLYVINYL ALCOHOL 1.4 % OP SOLN: 1.0000 [drp] | Freq: Four times a day (QID) | OPHTHALMIC | Status: DC | PRN

## 2021-07-16 MED ORDER — ACETAMINOPHEN 650 MG RE SUPP: 650.0000 mg | Freq: Four times a day (QID) | RECTAL | Status: DC | PRN

## 2021-07-16 MED ORDER — BIOTENE DRY MOUTH MT LIQD
15.0000 mL | OROMUCOSAL | Status: DC | PRN
Start: 2021-07-16 — End: 2021-07-17

## 2021-07-16 MED ORDER — GLYCOPYRROLATE 0.2 MG/ML IJ SOLN: 0.2000 mg | INTRAMUSCULAR | Status: DC | PRN

## 2021-07-16 MED ORDER — MORPHINE 100MG IN NS 100ML (1MG/ML) PREMIX INFUSION: 1.0000 mg/h | INTRAVENOUS | Status: DC

## 2021-07-16 MED ORDER — SODIUM CHLORIDE 0.9% FLUSH: 10.0000 mL | INTRAVENOUS | Status: DC | PRN

## 2021-07-16 MED ORDER — MORPHINE SULFATE (PF) 2 MG/ML IV SOLN
INTRAVENOUS | Status: AC
  Filled 2021-07-16: qty 1

## 2021-07-16 MED ORDER — DEXTROSE 50 % IV SOLN
25.0000 g | Freq: Once | INTRAVENOUS | Status: AC
  Administered 2021-07-16: 25 g via INTRAVENOUS
  Filled 2021-07-16: qty 50

## 2021-07-16 MED ORDER — PIPERACILLIN-TAZOBACTAM 3.375 G IVPB
3.3750 g | Freq: Three times a day (TID) | INTRAVENOUS | Status: DC
  Administered 2021-07-16 (×2): 3.375 g via INTRAVENOUS
  Filled 2021-07-16 (×2): qty 50

## 2021-07-16 MED ORDER — VALPROATE SODIUM 100 MG/ML IV SOLN
500.0000 mg | Freq: Three times a day (TID) | INTRAVENOUS | Status: DC
  Administered 2021-07-16 (×2): 500 mg via INTRAVENOUS
  Filled 2021-07-16 (×2): qty 5

## 2021-07-17 ENCOUNTER — Encounter: Payer: Self-pay | Admitting: Emergency Medicine

## 2021-07-17 ENCOUNTER — Encounter: Payer: Medicare HMO | Admitting: Physician Assistant

## 2021-07-17 LAB — T3, FREE: T3, Free: 2.5 pg/mL (ref 2.0–4.4)

## 2021-07-18 LAB — URINE CULTURE: Culture: >=100000 — AB

## 2021-07-18 LAB — CULTURE, RESPIRATORY W GRAM STAIN
Culture: NORMAL
Gram Stain: NONE SEEN

## 2021-07-20 LAB — BLOOD GAS, ARTERIAL
Acid-base deficit: 11.2 mmol/L — ABNORMAL HIGH (ref 0.0–2.0)
Acid-base deficit: 11.4 mmol/L — ABNORMAL HIGH (ref 0.0–2.0)
Bicarbonate: 16.4 mmol/L — ABNORMAL LOW (ref 20.0–28.0)
Bicarbonate: 16.4 mmol/L — ABNORMAL LOW (ref 20.0–28.0)
FIO2: 100 %
FIO2: 100 %
O2 Saturation: 99 %
O2 Saturation: 98.9 %
Patient temperature: 37
Patient temperature: 37
pCO2 arterial: 42 mmHg (ref 32–48)
pCO2 arterial: 43 mmHg (ref 32–48)
pH, Arterial: 7.2 — ABNORMAL LOW (ref 7.35–7.45)
pH, Arterial: 7.19 — CL (ref 7.35–7.45)
pO2, Arterial: 112 mmHg — ABNORMAL HIGH (ref 83–108)
pO2, Arterial: 121 mmHg — ABNORMAL HIGH (ref 83–108)

## 2021-08-12 NOTE — Progress Notes (Signed)
Called bedside by nursing because patient's heart rate was becoming increasingly bradycardic.  Heart rate dropped to a low of 18. Patient received atropine x1, epi x2 & sodium bicarb x1.  Transcutaneous pacing was initiated bedside and ultimately dopamine drip started. Consulted with Dr. Everardo All at [facility] and Dr. Darrold Junker as the STEMI cardiologist on-call.  Heart rate has stabilized however on dopamine drip, TC pacing stopped.  Patient now in a junctional tachycardia. We will continue medical management at this time.  If patient becomes severely bradycardic requiring TC pacing again, will alert on-call STEMI team for possible TV pacer placement.  Husband updated bedside. All questions and concerns answered at this time.  Cheryll Cockayne Rust-Chester, AGACNP-BC Acute Care Nurse Practitioner [facility]   [phone] / [phone] Please see Amion for pager details.

## 2021-08-12 NOTE — Consult Note (Signed)
PHARMACY CONSULT NOTE  Pharmacy Consult for Electrolyte Monitoring and Replacement   Recent Labs: Potassium (mmol/L)  Date Value  07-31-2021 5.7 (H)   Magnesium (mg/dL)  Date Value  94/85/4627 2.2   Calcium (mg/dL)  Date Value  03/50/0938 8.3 (L)   Albumin (g/dL)  Date Value  18/29/9371 2.7 (L)   Phosphorus (mg/dL)  Date Value  69/67/8938 4.4   Sodium (mmol/L)  Date Value  07/31/2021 138   Assessment: Patient is a 66 y/o F with medical history including DM, HTN, CKD, lymphedema, CAD s/p PCI with stenting, ICM, HFpEF, HLD who was BIBEMS from home with CPR in progress. Reported as Vfib arrest by EMS. Patient is currently admitted to the ICU where she is intubated, sedated, and on mechanical ventilation. She is hemodynamically unstable requiring multiple vasopressor infusions. Pharmacy consulted to assist with electrolyte monitoring and replacement as indicated.  MIVF: Isotonic bicarbonate gtt at 125 cc/hr  Goal of Therapy:  Electrolytes within normal limits  Plan:  --K 5.7, Lokelma 10 g q8h --No electrolyte replacement indicated at this time --Follow-up electrolytes with AM labs tomorrow  Tressie Ellis 2021-07-31 7:55 AM

## 2021-08-12 NOTE — Progress Notes (Signed)
[facility] Notified [phone]

## 2021-08-12 NOTE — Progress Notes (Addendum)
Chaplain made contact with family following difficult news regarding pt's poor prognosis, which they were not expecting.  Provided emotional support and orientation to support services. Facilitated storytelling.  More family is en route.  Will continue to follow.  Please contact if immediate support is needed.    Belia Heman, [place] Pager:  [phone]    08-02-2021 1300  Clinical Encounter Type  Visited With Family  Visit Type Initial;Critical Care;Psychological support  Referral From Nurse  Consult/Referral To Chaplain  Spiritual Encounters  Spiritual Needs Emotional  Stress Factors  Patient Stress Factors Major life changes  Family Stress Factors Loss of control;Major life changes

## 2021-08-12 NOTE — Consult Note (Addendum)
NEURO HOSPITALIST CONSULT NOTE   Requestig physician: Dr. Lanney Gins  Reason for Consult: Continued unresponsiveness with myoclonic jerking following cardiac arrest  History obtained from:  ICU Team and Chart     HPI:                                                                                                                                          Laura Mccarty is an 66 y.o. female with a PMHx of DM2, HTN, CKD, CAD, HLD, HFpEF, diabetic ulcers, morbid obesity and BLE lymphedema who presented with witnessed out of hospital v-fib cardiac arrest. CPR was in progress on arrival with pulses being lost and then regained multiple times. After stable ROSC was achieved, the patient remained unresponsive, and upper body myoclonic jerking was noted. Neurology has been consulted to assess for neurological prognosis.    PMHx T2DM HTN CKD 3a Lymphedema CAD s/p PCI with stent placement ICM Diabetic ulcers HFpEF (LVEF 60% HLD  No family history on file.         Social History:  has no history on file for tobacco use, alcohol use, and drug use.  Allergies  Allergen Reactions   Codeine Nausea Only    MEDICATIONS:                                                                                                                     Prior to Admission:  Medications Prior to Admission  Medication Sig Dispense Refill Last Dose   aspirin EC 81 MG tablet Take 81 mg by mouth daily.   Unknown at Unknown   atorvastatin (LIPITOR) 80 MG tablet Take 80 mg by mouth at bedtime.   Unknown   clopidogrel (PLAVIX) 75 MG tablet Take 75 mg by mouth daily.   Unknown at Unknown   Dulaglutide (TRULICITY) 8.75 IE/3.3IR SOPN Inject 0.75 mg into the skin once a week.   Unknown at Unknown   gabapentin (NEURONTIN) 100 MG capsule Take 300 mg by mouth at bedtime.   Unknown at Unknown   glipiZIDE (GLUCOTROL XL) 10 MG 24 hr tablet Take 10 mg by mouth daily with breakfast.   Unknown at Unknown    lisinopril (ZESTRIL) 20 MG tablet Take 20 mg by mouth daily.   Unknown at Unknown  torsemide (DEMADEX) 20 MG tablet Take 40 mg by mouth daily.   Unknown at Unknown   Scheduled:  chlorhexidine gluconate (MEDLINE KIT)  15 mL Mouth Rinse BID   Continuous:  levETIRAcetam Stopped (07-17-2021 1407)   midazolam 10 mg/hr (2021-07-17 1531)   morphine       ROS:                                                                                                                                       Unable to obtain due to unresponsiveness.    Blood pressure (!) 128/40, pulse 68, temperature (!) 93.7 F (34.3 C), resp. rate (!) 22, height '5\' 6"'  (1.676 m), weight (!) 185 kg, SpO2 90 %.   General Examination:                                                                                                       Physical Exam  HEENT-  [place]/AT    Lungs- Intubated   Extremities- Severe lymphedema noted   Neurological Examination Mental Status: Unresponsive off all sedation. GCS 3T. No evidence for any cortically mediated responses spontaneously or to external stimuli. Continuous facial myoclonus noted.  Cranial Nerves: II: No blink to threat. Left pupil 4 mm and unreactive. Right pupil 3 mm and sluggishly reactive.   III,IV, VI: Eyes are slightly exotropic, near the midline, without nystagmus. No doll's eye reflex.  V,VII: Absent corneal reflexes bilaterally  VIII: No response to voice IX,X: Intubated XI: Unable to assess XII: Unable to assess Motor/Sensory: Flaccid tone x 4. Intermittent low amplitude myoclonus of right ankle. Otherwise with no movement of any extremity spontaneously or to stimulation, including noxious plantar stimulation bilaterally.  Deep Tendon Reflexes: Unable to elicit in the context of prominent adiposity.  Plantars: Mute bilaterally  Cerebellar/Gait: Unable to assess    Lab Results: Basic Metabolic Panel: Recent Labs  Lab 07/15/21 1810 07/15/21 2257 07/17/2021 0527   NA 139  --  138  K 7.2* 6.0* 5.7*  CL 120*  --  116*  CO2 15*  --  17*  GLUCOSE 155*  --  143*  BUN 41*  --  45*  CREATININE 1.88*  --  1.95*  CALCIUM 10.6*  --  8.3*  MG 2.7*  --  2.2  PHOS 6.6*  --  4.4    CBC: Recent Labs  Lab 07/15/21 1654 07/15/21 2257 07-17-2021 0527  WBC 22.1*  --  17.4*  HGB 9.3* 8.1* 8.3*  HCT 34.0* 28.2* 28.5*  MCV 116.8*  --  109.2*  PLT 229  --  202    Cardiac Enzymes: No results for input(s): CKTOTAL, CKMB, CKMBINDEX, TROPONINI in the last 168 hours.  Lipid Panel: No results for input(s): CHOL, TRIG, HDL, CHOLHDL, VLDL, LDLCALC in the last 168 hours.  Imaging: DG Abd 1 View  Result Date: 07/15/2021 CLINICAL DATA:  OG tube placement EXAM: ABDOMEN - 1 VIEW COMPARISON:  07/15/2021 FINDINGS: Esophageal tube tip overlies the distal stomach. Upper gas pattern is unremarkable IMPRESSION: Esophageal tube tip overlies the distal stomach Electronically Signed   By: Donavan Foil M.D.   On: 07/15/2021 20:08   CT HEAD WO CONTRAST (5MM)  Result Date: 07/15/2021 CLINICAL DATA:  Cardiac arrest.  Mental status change. EXAM: CT HEAD WITHOUT CONTRAST TECHNIQUE: Contiguous axial images were obtained from the base of the skull through the vertex without intravenous contrast. RADIATION DOSE REDUCTION: This exam was performed according to the departmental dose-optimization program which includes automated exposure control, adjustment of the mA and/or kV according to patient size and/or use of iterative reconstruction technique. COMPARISON:  None Available. FINDINGS: Brain: No intracranial hemorrhage, mass effect, or midline shift. No hydrocephalus. The basilar cisterns are patent. No evidence of territorial infarct or acute ischemia. Gray-white differentiation is preserved. No extra-axial or intracranial fluid collection. Vascular: Atherosclerosis of skullbase vasculature without hyperdense vessel or abnormal calcification. Skull: No fracture or focal lesion.  Sinuses/Orbits: Intubation. Mucosal thickening throughout the paranasal sinuses and small fluid levels in the maxillary and sphenoid sinus may be related to intubation. No mastoid effusion. Other: None. IMPRESSION: No acute intracranial abnormality. Electronically Signed   By: Keith Rake M.D.   On: 07/15/2021 21:42   DG Chest Port 1 View  Result Date: 2021-08-10 CLINICAL DATA:  Ventilator dependent respiratory failure. EXAM: PORTABLE CHEST 1 VIEW COMPARISON:  Portable chest yesterday at 10:34 p.m., chest CT yesterday at 8:57 p.m. FINDINGS: 6:07 a.m., 10-Aug-2021. A partially lucent electrical pad overlies left chest. ETT tip 3.2 cm from the carina. NGT is well inside the stomach, the distal end directed cephalad just beneath the crest of the left hemidiaphragm. Left IJ central line terminates in the upper SVC. There is extensive diffuse patchy dense airspace consolidation throughout the lungs, small pleural effusions. Currently the opacities are more dense and confluent than previously in the left upper lobe and lower lateral left lung, unchanged on the right. There is aortic atherosclerosis. The heart enlarged. Central vascular prominence is similar. Bilateral anterior ribcage fractures were better demonstrated on CT. No pneumothorax is seen. IMPRESSION: Worsening airspace disease in the left upper and lateral left lower lung fields. No change in the right-sided airspace disease. Persistent central vascular prominence. Small pleural effusions are unchanged. Electronically Signed   By: Telford Nab M.D.   On: August 10, 2021 06:31   DG Chest Port 1 View  Result Date: 07/15/2021 CLINICAL DATA:  Central line placement. EXAM: PORTABLE CHEST 1 VIEW COMPARISON:  Radiograph and CT earlier today. FINDINGS: New left internal jugular central venous catheter tip overlies the brachiocephalic confluence. Endotracheal tube tip approximately 3.2 cm from the carina. Enteric tube tip below the diaphragm not included in the  field of view. Dense consolidation in both lungs, progressing in the right upper lung zone. No pneumothorax. Grossly stable heart size. IMPRESSION: 1. New left internal jugular central venous catheter with tip overlying the brachiocephalic confluence. No pneumothorax. 2. Endotracheal tube tip approximately 3.2 cm from the carina. Enteric tube tip below the  diaphragm not included in the field of view. 3. Dense bilateral airspace disease, with progression in the right upper lung zone. Electronically Signed   By: Keith Rake M.D.   On: 07/15/2021 23:03   DG Chest Port 1 View  Result Date: 07/15/2021 CLINICAL DATA:  Respiratory distress and altered mental status. EXAM: PORTABLE CHEST 1 VIEW COMPARISON:  None Available. FINDINGS: Endotracheal tube is identified 2.5 cm above the carina. Diffuse bilateral airspace opacities/consolidation noted, RIGHT greater than LEFT. There may be a small RIGHT pleural effusion present. No pneumothorax noted. Defibrillator/pacing pads overlying the chest are noted. IMPRESSION: Diffuse bilateral airspace opacities/consolidation, RIGHT greater than LEFT, with possible small RIGHT pleural effusion. Endotracheal tube with tip 2.5 cm above the carina. Electronically Signed   By: Margarette Canada M.D.   On: 07/15/2021 17:31   DG Abd Portable 1V  Result Date: 07/15/2021 CLINICAL DATA:  OG tube placement. EXAM: PORTABLE ABDOMEN - 1 VIEW COMPARISON:  None Available. FINDINGS: Enteric tube noted with tip overlying the proximal stomach and side hole near the esophagogastric junction. IMPRESSION: Enteric tube with tip overlying the proximal stomach and side hole near the esophagogastric junction. Recommend advancement. Electronically Signed   By: Margarette Canada M.D.   On: 07/15/2021 17:32   CT CHEST ABDOMEN PELVIS WO CONTRAST  Result Date: 07/15/2021 CLINICAL DATA:  Hemoptysis.  Cardiac arrest EXAM: CT CHEST, ABDOMEN AND PELVIS WITHOUT CONTRAST TECHNIQUE: Multidetector CT imaging of the chest,  abdomen and pelvis was performed following the standard protocol without IV contrast. RADIATION DOSE REDUCTION: This exam was performed according to the departmental dose-optimization program which includes automated exposure control, adjustment of the mA and/or kV according to patient size and/or use of iterative reconstruction technique. COMPARISON:  None Available. FINDINGS: CT CHEST FINDINGS Lines and tubes: Endotracheal tube terminates 3.5 cm above the carina. Enteric tube with tip and side port within the gastric lumen. The enteric tube is noted to loop once within the gastric lumen. Cardiovascular: Normal heart size. No significant pericardial effusion. The thoracic aorta is normal in caliber. Mild-to-moderate atherosclerotic plaque of the thoracic aorta. Four-vessel coronary artery coronary artery calcifications. Mediastinum/Nodes: No gross hilar adenopathy, noting limited sensitivity for the detection of hilar adenopathy on this noncontrast study. no enlarged mediastinal, hilar, or axillary lymph nodes. Thyroid gland, trachea, and esophagus demonstrate no significant findings. Lungs/Pleura: Diffuse patchy airspace opacities with prominent consolidations with air bronchograms within the peribronchovascular regions and dependent portions of the lungs. Limited evaluation for pulmonary mass. Likely trace bilateral pleural effusions. No pneumothorax. Musculoskeletal: No chest wall abnormality. No suspicious lytic or blastic osseous lesions. Acute nondisplaced sternal fracture. Acute anterior nondisplaced left sixth, seventh, eighth, ninth rib fractures. Acute anterior displaced left fifth rib fracture. Acute right anterior nondisplaced third, sixth, seventh rib fractures. Acute anterior displaced right fourth and fifth rib fractures. Multilevel degenerative changes of the spine. CT ABDOMEN PELVIS FINDINGS Hepatobiliary: No focal liver abnormality. Status post cholecystectomy. No biliary dilatation. Pancreas:  Diffusely atrophic. No focal lesion. Otherwise normal pancreatic contour. No surrounding inflammatory changes. No main pancreatic ductal dilatation. Spleen: Normal in size without focal abnormality. Adrenals/Urinary Tract: No adrenal nodule bilaterally. No nephrolithiasis and no hydronephrosis. No definite contour-deforming renal mass. No ureterolithiasis or hydroureter. The urinary bladder is unremarkable. Stomach/Bowel: Stomach is within normal limits. No evidence of bowel wall thickening or dilatation. Appendix appears normal. Vascular/Lymphatic: No abdominal aorta or iliac aneurysm. Severe atherosclerotic plaque of the aorta and its branches. No abdominal, pelvic, or inguinal lymphadenopathy. Reproductive: Calcified lesion  within the uterine wall likely degenerative uterine fibroid. Otherwise uterus and bilateral adnexa are unremarkable. Bilateral tubal ligation. Other: At least small volume upper abdominal high density free fluid extending into the pelvis. No intraperitoneal free gas. No organized fluid collection. Musculoskeletal: No abdominal wall hernia or abnormality. Mild subcutaneus soft tissue edema. No suspicious lytic or blastic osseous lesions. No acute displaced fracture. Multilevel degenerative changes of the spine. IMPRESSION: 1. Diffuse patchy airspace opacities with prominent consolidationswithin the peribronchovascular and dependent portions of the lungs. Finding likely represent a combination of pulmonary edema and infection/inflammation. 2. Trace bilateral pleural effusions. 3. At least small volume upper abdominal high density free fluid extending into the pelvis. Finding could represent hemoperitoneum versus ascites with limited evaluation on this noncontrast study. 4. Acute anterior left 5-9 and right 3-7 rib fractures. 5. Acute nondisplaced sternal fracture. 6. Aortic Atherosclerosis (ICD10-I70.0) including four-vessel coronary calcifications. 7. Endotracheal tube in good position. 8.  Enteric tube in good position with loop within the gastric lumen. Consider retracting by 6 cm. 9. Other imaging findings of potential clinical significance: Uterine fibroids Electronically Signed   By: Iven Finn M.D.   On: 07/15/2021 21:50     Assessment: 66 year old female who presented with witnessed out of hospital v-fib cardiac arrest. CPR was in progress on arrival with pulses being lost and then regained multiple times. After stable ROSC was achieved, the patient remained unresponsive, and upper body myoclonic jerking was noted. Neurology has been consulted to assess for neurological prognosis.   1. Exam reveals continuous myoclonic jerking of the face and intermittent myoclonus of the right ankle. GCS 3T with no evidence for any cortically mediated responses to stimuli or spontaneously. Right pupil is sluggishly reactive. Corneal and oculocephalic reflexes are absent. 2. CT head shows no acute abnormality.  3. 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   Recommendations: 1. Discussed prognosis and goals of care twice today with family, together with Dr. Lanney Gins and the ICU NP, [facility]. All questions answered.  2. Family is considering discontinuation of supportive care but still want to have members see the patient at the bedside prior to final decision being made.     60 minutes spent in the neurological evaluation and management of this critically ill patient. Time spent included coordination of care and providing prognostic information to family.   Electronically signed: Dr. Kerney Elbe 2021/08/09, 11:14 AM

## 2021-08-12 NOTE — Procedures (Signed)
Patient Name: Kynslie Calitri  MRN: [id number]  Epilepsy Attending: Lora Havens  Referring Physician/Provider: Ottie Glazier, MD Date: Aug 15, 2021 Duration: 27.20 mins  Patient history: 66yo F s/p cardiac arrest now with jerking. EEG to evaluate for seizure  Level of alertness: comatose  AEDs during EEG study: None  Technical aspects: This EEG study was done with scalp electrodes positioned according to the 10-20 International system of electrode placement. Electrical activity was acquired at a sampling rate of 500Hz  and reviewed with a high frequency filter of 70Hz  and a low frequency filter of 1Hz . EEG data were recorded continuously and digitally stored.   Description: Patient was noted to have brief jerking of shoulders every few seconds Concomitant eeg showed generalized polyspikes consistent with myoclonic seizure. In between seizure, eeg showed burst suppression with highly epileptiform bursts lasting 0.5-2 seconds as well as 1-3 seconds of generalized eeg suppression. Hyperventilation and photic stimulation were not performed.     ABNORMALITY -Myoclonic seizure, generalized -Burst suppression with highly epileptiform bursts, generalized  IMPRESSION: This study showed evidence of myoclonic seizures every few seconds as well as profound diffuse encephalopathy. 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  Dr Lanney Gins was notified.  Jhalen Eley Barbra Sarks

## 2021-08-12 NOTE — Progress Notes (Signed)
Chaplain returned following RN's notice that family decided to transition to comfort care.

## 2021-08-12 NOTE — Progress Notes (Signed)
Pharmacy Antibiotic Note  Laura Mccarty is a 66 y.o. female admitted on 07/15/2021 following Cardiac Arrest.  Pharmacy has been consulted for Zosyn dosing for aspiration pneumonia and wound infection and Vancomycin for sepsis.  Plan: Zosyn 3.375g IV q8h (4 hour infusion).  Pt ordered initial dose of Vancomycin 2 gm. Vancomycin 1250 mg IV Q 24 hrs. Goal AUC 400-550. Expected AUC: 484.5 SCr used: 1.88 Vd used: 0.5, BMI 65.83  Pharmacy will continue to follow will adjust abx dosing whenever warranted.  Height: 5\' 6"  (167.6 cm) Weight: (!) 185 kg (407 lb 13.6 oz) IBW/kg (Calculated) : 59.3  Temp (24hrs), Avg:97.5 F (36.4 C), Min:93.4 F (34.1 C), Max:99 F (37.2 C)  Recent Labs  Lab 07/15/21 1654 07/15/21 1810 07/15/21 1917 07/15/21 2257  WBC 22.1*  --   --   --   CREATININE  --  1.88*  --   --   LATICACIDVEN  --   --  3.8* 3.8*    Estimated Creatinine Clearance: 52.3 mL/min (A) (by C-G formula based on SCr of 1.88 mg/dL (H)).    Allergies  Allergen Reactions   Codeine Nausea Only    Antimicrobials this admission: 6/03 Zosyn >>  6/03 Vancomycin >>   Microbiology results: 6/03 RespCx: Pending 6/03 UCx: Pending   Thank you for allowing pharmacy to be a part of this patient's care.  Renda Rolls, PharmD, MBA 2021/07/22 2:08 AM

## 2021-08-12 NOTE — Progress Notes (Signed)
  Interdisciplinary Goals of Care Family Meeting   Date carried out: 08-05-2021  Location of the meeting: Conference room  Member's involved: Nurse Practitioner, Bedside Registered Nurse, and Family Member or next of kin  Durable Power of Attorney or acting medical decision maker: Husband Laura Mccarty     Discussion: We discussed goals of care for [name] . Pts spouse Laura Mccarty, daughter, son, and daughter-in-law were all present during code status and goals of care conversation.  They are all in agreement they no longer want the pt to suffer and understand the EEG results are consistent with anoxic brain injury following a detailed discussion, and they do not want the pt to have a poor quality of life.  Code status: Full DNR  Disposition: In-patient comfort care  Time spent for the meeting: 20 minutes     Zada Girt, AGNP  Pulmonary/Critical Care Pager [phone] (please enter 7 digits) PCCM Consult Pager [phone] (please enter 7 digits)

## 2021-08-12 NOTE — Consult Note (Signed)
[facility] CONSULT NOTE    Date: 08/05/2021                  Patient Name:  Laura Mccarty  MRN: [id number]  DOB: [birth year]  Age / Sex: 66 y.o., female         PCP: [facility]                 Service Requesting Consult: Critical care                 Reason for Consult: Acute kidney injury            History of Present Illness: Patient is a 66 y.o. female with a PMHx of diabetes mellitus type 2, hypertension, lymphedema, coronary disease status post history of PCI, diastolic heart failure, hyperlipidemia, chronic lymphedema, chronic kidney disease stage IIIa baseline EGFR 51, who was admitted to [facility] on 07-Aug-2021 for evaluation of cardiac arrest.  Patient unable to provide any history at this time as she is currently intubated but off sedation.  She was brought to the emergency department on 2021/08/07 and was an active CPR on route.  Recently she apparently had increasing bilateral lower extremity edema due to lymphedema.  She apparently has been sleeping in a recliner and has had difficulty mobilizing.  She apparently experienced a fall and thereafter became unresponsive.  We are asked to see her for evaluation management of acute kidney injury in the setting of known chronic kidney disease stage IIIa.  Patient was previously followed by [facility].  Renal function deteriorating.  BUN up to 44 with a creatinine of 2.03.  She was having myoclonic activity upon initial evaluation.  Hyperkalemia also noted with serum potassium of 6.1.  Critical care was to have had a discussion with the patient's family regarding goals of care.  Medications: Outpatient medications: Medications Prior to Admission  Medication Sig Dispense Refill Last Dose   aspirin EC 81 MG tablet Take 81 mg by mouth daily.   Unknown at Unknown   atorvastatin (LIPITOR) 80 MG tablet Take 80 mg by mouth at bedtime.   Unknown   clopidogrel (PLAVIX) 75 MG tablet Take 75 mg by mouth  daily.   Unknown at Unknown   Dulaglutide (TRULICITY) 0.75 MG/0.5ML SOPN Inject 0.75 mg into the skin once a week.   Unknown at Unknown   gabapentin (NEURONTIN) 100 MG capsule Take 300 mg by mouth at bedtime.   Unknown at Unknown   glipiZIDE (GLUCOTROL XL) 10 MG 24 hr tablet Take 10 mg by mouth daily with breakfast.   Unknown at Unknown   lisinopril (ZESTRIL) 20 MG tablet Take 20 mg by mouth daily.   Unknown at Unknown   torsemide (DEMADEX) 20 MG tablet Take 40 mg by mouth daily.   Unknown at Unknown    Current medications: Current Facility-Administered Medications  Medication Dose Route Frequency Provider Last Rate Last Admin   chlorhexidine gluconate (MEDLINE KIT) (PERIDEX) 0.12 % solution 15 mL  15 mL Mouth Rinse BID Rust-[facility], Britton L, NP   15 mL at 08/02/2021 0757   Chlorhexidine Gluconate Cloth 2 % PADS 6 each  6 each Topical Daily Rust-Chester, Cecelia Byars, NP       docusate (COLACE) 50 MG/5ML liquid 100 mg  100 mg Per Tube BID Rust-Chester, Britton L, NP   100 mg at 07/17/2021 0948   docusate sodium (COLACE) capsule 100 mg  100 mg Oral BID PRN Zada Girt  G, NP       DOPamine (INTROPIN) 800 mg in dextrose 5 % 250 mL (3.2 mg/mL) infusion  0-20 mcg/kg/min Intravenous Titrated Rust-Chester, Britton L, NP 6.94 mL/hr at July 18, 2021 1400 2 mcg/kg/min at Jul 18, 2021 1400   famotidine (PEPCID) 40 MG/5ML suspension 20 mg  20 mg Per Tube BID Ezequiel Essex, NP   20 mg at 18-Jul-2021 0951   insulin aspart (novoLOG) injection 0-15 Units  0-15 Units Subcutaneous Q4H Rust-Chester, Britton L, NP   3 Units at 07-18-21 1216   ipratropium-albuterol (DUONEB) 0.5-2.5 (3) MG/3ML nebulizer solution 3 mL  3 mL Nebulization Q4H Rust-Chester, Britton L, NP   3 mL at 2021-07-18 1117   levETIRAcetam (KEPPRA) IVPB 500 mg/100 mL premix  500 mg Intravenous Q12H Caryl Pina, MD 400 mL/hr at 07/18/21 1400 Infusion Verify at 07-18-2021 1400   MEDLINE mouth rinse  15 mL Mouth Rinse 10 times per day Rust-Chester, Britton L, NP   15  mL at 2021/07/18 1339   methylPREDNISolone sodium succinate (SOLU-MEDROL) 40 mg/mL injection 40 mg  40 mg Intravenous Q12H Rust-Chester, Britton L, NP   40 mg at 07-18-2021 0443   midazolam (VERSED) 100 mg/100 mL (1 mg/mL) premix infusion  0.5-10 mg/hr Intravenous Continuous Vida Rigger, MD 5 mL/hr at Jul 18, 2021 1400 5 mg/hr at 07-18-21 1400   norepinephrine (LEVOPHED) 16 mg in premix infusion  0-40 mcg/min Intravenous Titrated Rust-Chester, Britton L, NP 14.06 mL/hr at 07-18-21 1400 15 mcg/min at 07/18/2021 1400   piperacillin-tazobactam (ZOSYN) IVPB 3.375 g  3.375 g Intravenous Q8H Otelia Sergeant, RPH 12.5 mL/hr at 07/18/21 1400 Infusion Verify at 07-18-21 1400   polyethylene glycol (MIRALAX / GLYCOLAX) packet 17 g  17 g Oral Daily PRN Ezequiel Essex, NP       polyethylene glycol (MIRALAX / GLYCOLAX) packet 17 g  17 g Per Tube Daily Rust-Chester, Britton L, NP       sodium bicarbonate 150 mEq in sterile water 1,150 mL infusion   Intravenous Continuous Ezequiel Essex, NP 125 mL/hr at 2021-07-18 1400 Infusion Verify at July 18, 2021 1400   sodium zirconium cyclosilicate (LOKELMA) packet 10 g  10 g Per Tube Q8H Rust-Chester, Britton L, NP   10 g at 07-18-2021 1327   vasopressin (PITRESSIN) 20 Units in sodium chloride 0.9 % 100 mL infusion-*FOR SHOCK*  0-0.04 Units/min Intravenous Continuous Ezequiel Essex, NP 12 mL/hr at Jul 18, 2021 1400 0.04 Units/min at Jul 18, 2021 1400      Allergies: Allergies  Allergen Reactions   Codeine Nausea Only      Past Medical History: Hypertension Hyperlipidemia Morbid obesity Diabetes mellitus type 2 Chronic kidney disease stage IIIa Lower extremity lymphedema Coronary artery disease status post PCI   Past Surgical History: Coronary artery disease status post PCI   Family History: No family history on file.   Social History: Social History   Socioeconomic History   Marital status: Married    Spouse name: Not on file   Number of children: Not on file    Years of education: Not on file   Highest education level: Not on file  Occupational History   Not on file  Tobacco Use   Smoking status: Not on file   Smokeless tobacco: Not on file  Substance and Sexual Activity   Alcohol use: Not on file   Drug use: Not on file   Sexual activity: Not on file  Other Topics Concern   Not on file  Social History Narrative   Not on  file   Social Determinants of Health   Financial Resource Strain: Not on file  Food Insecurity: Not on file  Transportation Needs: Not on file  Physical Activity: Not on file  Stress: Not on file  Social Connections: Not on file  Intimate Partner Violence: Not on file     Review of Systems: Patient unable to provide as she is currently on the ventilator  Vital Signs: Blood pressure 112/72, pulse 73, temperature (!) 94.6 F (34.8 C), resp. rate 20, height 5\' 6"  (1.676 m), weight (!) 185 kg, SpO2 90 %.  Weight trends: Filed Weights   07/18/2021 0000  Weight: (!) 185 kg     Physical Exam: General: Critically ill-appearing  Head: Normocephalic, atraumatic.  Endotracheal tube in place  Eyes: Closed  Neck: Supple  Lungs:  Scattered rales and rhonchi, vent assisted  Heart: S1S2 no rubs  Abdomen:  Soft, nontender, bowel sounds present  Extremities: Bilateral lower extremity lymphedema noted  Neurologic: Intubated, not currently on sedation, not following commands  Skin: No acute rash  Access: No hemodialysis access    Lab results: Basic Metabolic Panel: Recent Labs  Lab 01-Aug-2021 1810 01-Aug-2021 2257 08/05/2021 0527 07/29/2021 1210  NA 139  --  138 138  K 7.2* 6.0* 5.7* 6.1*  CL 120*  --  116* 114*  CO2 15*  --  17* 18*  GLUCOSE 155*  --  143* 232*  BUN 41*  --  45* 44*  CREATININE 1.88*  --  1.95* 2.03*  CALCIUM 10.6*  --  8.3* 8.4*  MG 2.7*  --  2.2  --   PHOS 6.6*  --  4.4  --     Liver Function Tests: Recent Labs  Lab 2021/08/01 1810 07/30/2021 0527  AST 60* 59*  ALT 28 27  ALKPHOS 162* 125   BILITOT 1.0 1.5*  PROT 7.1 6.0*  ALBUMIN 2.8* 2.7*   No results for input(s): LIPASE, AMYLASE in the last 168 hours. No results for input(s): AMMONIA in the last 168 hours.  CBC: Recent Labs  Lab 08/01/21 1654 08/01/2021 2257 07/15/2021 0527  WBC 22.1*  --  17.4*  HGB 9.3* 8.1* 8.3*  HCT 34.0* 28.2* 28.5*  MCV 116.8*  --  109.2*  PLT 229  --  202    Cardiac Enzymes: No results for input(s): CKTOTAL, CKMB, CKMBINDEX, TROPONINI in the last 168 hours.  BNP: Invalid input(s): POCBNP  CBG: Recent Labs  Lab 2021/08/01 2134 08-01-2021 2337 07/30/2021 0352 08/08/2021 0749 08/11/2021 1141  GLUCAP 155* 111* 130* 137* 178*    Microbiology: Results for orders placed or performed during the hospital encounter of 2021-08-01  Respiratory (~20 pathogens) panel by PCR     Status: Abnormal   Collection Time: 2021-08-01  7:18 PM   Specimen: Nasopharyngeal Swab; Respiratory  Result Value Ref Range Status   Adenovirus NOT DETECTED NOT DETECTED Final   Coronavirus 229E NOT DETECTED NOT DETECTED Final    Comment: (NOTE) The Coronavirus on the Respiratory Panel, DOES NOT test for the novel  Coronavirus (2019 nCoV)    Coronavirus HKU1 NOT DETECTED NOT DETECTED Final   Coronavirus NL63 NOT DETECTED NOT DETECTED Final   Coronavirus OC43 NOT DETECTED NOT DETECTED Final   Metapneumovirus NOT DETECTED NOT DETECTED Final   Rhinovirus / Enterovirus DETECTED (A) NOT DETECTED Final   Influenza A NOT DETECTED NOT DETECTED Final   Influenza B NOT DETECTED NOT DETECTED Final   Parainfluenza Virus 1 NOT DETECTED NOT DETECTED Final  Parainfluenza Virus 2 NOT DETECTED NOT DETECTED Final   Parainfluenza Virus 3 NOT DETECTED NOT DETECTED Final   Parainfluenza Virus 4 NOT DETECTED NOT DETECTED Final   Respiratory Syncytial Virus NOT DETECTED NOT DETECTED Final   Bordetella pertussis NOT DETECTED NOT DETECTED Final   Bordetella Parapertussis NOT DETECTED NOT DETECTED Final   Chlamydophila pneumoniae NOT DETECTED  NOT DETECTED Final   Mycoplasma pneumoniae NOT DETECTED NOT DETECTED Final    Comment: Performed at [facility], [address] [place], [place] [address]  SARS Coronavirus 2 by RT PCR (hospital order, performed in [facility] hospital lab) *cepheid single result test* Nasopharyngeal Swab     Status: None   Collection Time: 2021-07-19  7:18 PM   Specimen: Nasopharyngeal Swab; Nasal Swab  Result Value Ref Range Status   SARS Coronavirus 2 by RT PCR NEGATIVE NEGATIVE Final    Comment: (NOTE) SARS-CoV-2 target nucleic acids are NOT DETECTED.  The SARS-CoV-2 RNA is generally detectable in upper and lower respiratory specimens during the acute phase of infection. The lowest concentration of SARS-CoV-2 viral copies this assay can detect is 250 copies / mL. A negative result does not preclude SARS-CoV-2 infection and should not be used as the sole basis for treatment or other patient management decisions.  A negative result may occur with improper specimen collection / handling, submission of specimen other than nasopharyngeal swab, presence of viral mutation(s) within the areas targeted by this assay, and inadequate number of viral copies (<250 copies / mL). A negative result must be combined with clinical observations, patient history, and epidemiological information.  Fact Sheet for Patients:   RoadLapTop.co.za  Fact Sheet for Healthcare Providers: http://kim-miller.com/  This test is not yet approved or  cleared by the [place] FDA and has been authorized for detection and/or diagnosis of SARS-CoV-2 by FDA under an Emergency Use Authorization (EUA).  This EUA will remain in effect (meaning this test can be used) for the duration of the COVID-19 declaration under Section 564(b)(1) of the Act, 21 U.S.C. section 360bbb-3(b)(1), unless the authorization is terminated or revoked sooner.  Performed at [facility], [address] [place], [place] [address]   Culture, Respiratory w Gram Stain     Status: None (Preliminary result)   Collection Time: Jul 19, 2021 10:57 PM   Specimen: Tracheal Aspirate  Result Value Ref Range Status   Specimen Description   Final    EXPECTORATED SPUTUM Performed at [facility], [address] [place], [place] [address]    Special Requests   Final    NONE Performed at [facility], [address] [place], [place] [address]    Gram Stain   Final    NO SQUAMOUS EPITHELIAL CELLS SEEN FEW WBC SEEN NO ORGANISMS SEEN Performed at [facility], [address] [place], [place] [address]    Culture PENDING  Incomplete   Report Status PENDING  Incomplete  MRSA Next Gen by PCR, Nasal     Status: None   Collection Time: 08/07/2021  3:17 AM   Specimen: Nasal Mucosa; Nasal Swab  Result Value Ref Range Status   MRSA by PCR Next Gen NOT DETECTED NOT DETECTED Final    Comment: (NOTE) The GeneXpert MRSA Assay (FDA approved for NASAL specimens only), is one component of a comprehensive MRSA colonization surveillance program. It is not intended to diagnose MRSA infection nor to guide or monitor treatment for MRSA infections. Test performance is not FDA approved  in patients less than 86 years old. Performed at [facility], [address] [place], [place] [address]     Coagulation Studies: Recent Labs    07/15/21 1810 07/15/21 2257  LABPROT >90.0* 17.9*  INR >10.0* 1.5*    Urinalysis: Recent Labs    07/15/21 1917  COLORURINE YELLOW*  LABSPEC 1.013  PHURINE 5.0  GLUCOSEU NEGATIVE  HGBUR MODERATE*  BILIRUBINUR NEGATIVE  KETONESUR NEGATIVE  PROTEINUR 100*  NITRITE NEGATIVE  LEUKOCYTESUR LARGE*      Imaging: DG Abd 1 View  Result Date: 07/15/2021 CLINICAL DATA:  OG tube placement EXAM: ABDOMEN - 1 VIEW COMPARISON:  07/15/2021 FINDINGS: Esophageal tube tip overlies the distal stomach. Upper gas pattern is unremarkable  IMPRESSION: Esophageal tube tip overlies the distal stomach Electronically Signed   By: Jasmine Pang M.D.   On: 07/15/2021 20:08   CT HEAD WO CONTRAST ( )  Result Date: 07/15/2021 CLINICAL DATA:  Cardiac arrest.  Mental status change. EXAM: CT HEAD WITHOUT CONTRAST TECHNIQUE: Contiguous axial images were obtained from the base of the skull through the vertex without intravenous contrast. RADIATION DOSE REDUCTION: This exam was performed according to the departmental dose-optimization program which includes automated exposure control, adjustment of the mA and/or kV according to patient size and/or use of iterative reconstruction technique. COMPARISON:  None Available. FINDINGS: Brain: No intracranial hemorrhage, mass effect, or midline shift. No hydrocephalus. The basilar cisterns are patent. No evidence of territorial infarct or acute ischemia. Gray-white differentiation is preserved. No extra-axial or intracranial fluid collection. Vascular: Atherosclerosis of skullbase vasculature without hyperdense vessel or abnormal calcification. Skull: No fracture or focal lesion. Sinuses/Orbits: Intubation. Mucosal thickening throughout the paranasal sinuses and small fluid levels in the maxillary and sphenoid sinus may be related to intubation. No mastoid effusion. Other: None. IMPRESSION: No acute intracranial abnormality. Electronically Signed   By: Narda Rutherford M.D.   On: 07/15/2021 21:42   US Venous Img Lower Bilateral (DVT)  Result Date: 07-30-2021 CLINICAL DATA:  Evaluate for DVT. EXAM: BILATERAL LOWER EXTREMITY VENOUS DOPPLER ULTRASOUND TECHNIQUE: Gray-scale sonography with graded compression, as well as color Doppler and duplex ultrasound were performed to evaluate the lower extremity deep venous systems from the level of the common femoral vein and including the common femoral, femoral, profunda femoral, popliteal and calf veins including the posterior tibial, peroneal and gastrocnemius veins when  visible. The superficial great saphenous vein was also interrogated. Spectral Doppler was utilized to evaluate flow at rest and with distal augmentation maneuvers in the common femoral, femoral and popliteal veins. COMPARISON:  None Available. FINDINGS: Examination is degraded due to patient body habitus and poor sonographic window. RIGHT LOWER EXTREMITY Common Femoral Vein: No evidence of thrombus. Normal compressibility, respiratory phasicity and response to augmentation. Saphenofemoral Junction: No evidence of thrombus. Normal compressibility and flow on color Doppler imaging. Profunda Femoral Vein: No evidence of thrombus. Normal compressibility and flow on color Doppler imaging. Femoral Vein: No evidence of thrombus. Normal compressibility, respiratory phasicity and response to augmentation. Popliteal Vein: Not well visualized. Calf Veins: Appear patent where visualized. Superficial Great Saphenous Vein: No evidence of thrombus. Normal compressibility. Other Findings: There is a minimal amount of subcutaneous edema at the level of the popliteal fossa without definitive Baker's cyst. LEFT LOWER EXTREMITY Common Femoral Vein: No evidence of thrombus. Normal compressibility, respiratory phasicity and response to augmentation. Saphenofemoral Junction: No evidence of thrombus. Normal compressibility and flow on color Doppler imaging. Profunda Femoral Vein: No evidence of thrombus. Normal compressibility and flow  on color Doppler imaging. Femoral Vein: Appears patent where visualized. Popliteal Vein: Not well visualized. Calf Veins: Not well visualized. Superficial Great Saphenous Vein: No evidence of thrombus. Normal compressibility. Other Findings:  None. IMPRESSION: No definite evidence of DVT on this body habitus degraded examination. Electronically Signed   By: Simonne Come M.D.   On: 08-07-2021 13:44   DG Chest Port 1 View  Result Date: 08/07/21 CLINICAL DATA:  Ventilator dependent respiratory failure.  EXAM: PORTABLE CHEST 1 VIEW COMPARISON:  Portable chest yesterday at 10:34 p.m., chest CT yesterday at 8:57 p.m. FINDINGS: 6:07 a.m., 2021-08-07. A partially lucent electrical pad overlies left chest. ETT tip 3.2 cm from the carina. NGT is well inside the stomach, the distal end directed cephalad just beneath the crest of the left hemidiaphragm. Left IJ central line terminates in the upper SVC. There is extensive diffuse patchy dense airspace consolidation throughout the lungs, small pleural effusions. Currently the opacities are more dense and confluent than previously in the left upper lobe and lower lateral left lung, unchanged on the right. There is aortic atherosclerosis. The heart enlarged. Central vascular prominence is similar. Bilateral anterior ribcage fractures were better demonstrated on CT. No pneumothorax is seen. IMPRESSION: Worsening airspace disease in the left upper and lateral left lower lung fields. No change in the right-sided airspace disease. Persistent central vascular prominence. Small pleural effusions are unchanged. Electronically Signed   By: Almira Bar M.D.   On: August 07, 2021 06:31   DG Chest Port 1 View  Result Date: 07/15/2021 CLINICAL DATA:  Central line placement. EXAM: PORTABLE CHEST 1 VIEW COMPARISON:  Radiograph and CT earlier today. FINDINGS: New left internal jugular central venous catheter tip overlies the brachiocephalic confluence. Endotracheal tube tip approximately 3.2 cm from the carina. Enteric tube tip below the diaphragm not included in the field of view. Dense consolidation in both lungs, progressing in the right upper lung zone. No pneumothorax. Grossly stable heart size. IMPRESSION: 1. New left internal jugular central venous catheter with tip overlying the brachiocephalic confluence. No pneumothorax. 2. Endotracheal tube tip approximately 3.2 cm from the carina. Enteric tube tip below the diaphragm not included in the field of view. 3. Dense bilateral airspace  disease, with progression in the right upper lung zone. Electronically Signed   By: Narda Rutherford M.D.   On: 07/15/2021 23:03   DG Chest Port 1 View  Result Date: 07/15/2021 CLINICAL DATA:  Respiratory distress and altered mental status. EXAM: PORTABLE CHEST 1 VIEW COMPARISON:  None Available. FINDINGS: Endotracheal tube is identified 2.5 cm above the carina. Diffuse bilateral airspace opacities/consolidation noted, RIGHT greater than LEFT. There may be a small RIGHT pleural effusion present. No pneumothorax noted. Defibrillator/pacing pads overlying the chest are noted. IMPRESSION: Diffuse bilateral airspace opacities/consolidation, RIGHT greater than LEFT, with possible small RIGHT pleural effusion. Endotracheal tube with tip 2.5 cm above the carina. Electronically Signed   By: Harmon Pier M.D.   On: 07/15/2021 17:31   DG Abd Portable 1V  Result Date: 07/15/2021 CLINICAL DATA:  OG tube placement. EXAM: PORTABLE ABDOMEN - 1 VIEW COMPARISON:  None Available. FINDINGS: Enteric tube noted with tip overlying the proximal stomach and side hole near the esophagogastric junction. IMPRESSION: Enteric tube with tip overlying the proximal stomach and side hole near the esophagogastric junction. Recommend advancement. Electronically Signed   By: Harmon Pier M.D.   On: 07/15/2021 17:32   EEG adult  Result Date: 07-Aug-2021 Charlsie Quest, MD     2021-08-07  1:29 PM Patient Name: Laura Mccarty MRN: [id number] Epilepsy Attending: Charlsie Quest Referring Physician/Provider: Vida Rigger, MD Date: 07/13/2021 Duration: 27.20 mins Patient history: 66yo F s/p cardiac arrest now with jerking. EEG to evaluate for seizure Level of alertness: comatose AEDs during EEG study: None Technical aspects: This EEG study was done with scalp electrodes positioned according to the 10-20 International system of electrode placement. Electrical activity was acquired at a sampling rate of 500Hz  and reviewed with a high frequency filter of  70Hz  and a low frequency filter of 1Hz . EEG data were recorded continuously and digitally stored. Description: Patient was noted to have brief jerking of shoulders every few seconds Concomitant eeg showed generalized polyspikes consistent with myoclonic seizure. In between seizure, eeg showed burst suppression with highly epileptiform bursts lasting 0.5-2 seconds as well as 1-3 seconds of generalized eeg suppression. Hyperventilation and photic stimulation were not performed.   ABNORMALITY -Myoclonic seizure, generalized -Burst suppression with highly epileptiform bursts, generalized IMPRESSION: This study showed evidence of myoclonic seizures every few seconds as well as profound diffuse encephalopathy. 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 Dr Karna Christmas was notified. Charlsie Quest   CT CHEST ABDOMEN PELVIS WO CONTRAST  Result Date: 18-Jul-2021 CLINICAL DATA:  Hemoptysis.  Cardiac arrest EXAM: CT CHEST, ABDOMEN AND PELVIS WITHOUT CONTRAST TECHNIQUE: Multidetector CT imaging of the chest, abdomen and pelvis was performed following the standard protocol without IV contrast. RADIATION DOSE REDUCTION: This exam was performed according to the departmental dose-optimization program which includes automated exposure control, adjustment of the mA and/or kV according to patient size and/or use of iterative reconstruction technique. COMPARISON:  None Available. FINDINGS: CT CHEST FINDINGS Lines and tubes: Endotracheal tube terminates 3.5 cm above the carina. Enteric tube with tip and side port within the gastric lumen. The enteric tube is noted to loop once within the gastric lumen. Cardiovascular: Normal heart size. No significant pericardial effusion. The thoracic aorta is normal in caliber. Mild-to-moderate atherosclerotic plaque of the thoracic aorta. Four-vessel coronary artery coronary artery calcifications. Mediastinum/Nodes: No gross hilar adenopathy, noting  limited sensitivity for the detection of hilar adenopathy on this noncontrast study. no enlarged mediastinal, hilar, or axillary lymph nodes. Thyroid gland, trachea, and esophagus demonstrate no significant findings. Lungs/Pleura: Diffuse patchy airspace opacities with prominent consolidations with air bronchograms within the peribronchovascular regions and dependent portions of the lungs. Limited evaluation for pulmonary mass. Likely trace bilateral pleural effusions. No pneumothorax. Musculoskeletal: No chest wall abnormality. No suspicious lytic or blastic osseous lesions. Acute nondisplaced sternal fracture. Acute anterior nondisplaced left sixth, seventh, eighth, ninth rib fractures. Acute anterior displaced left fifth rib fracture. Acute right anterior nondisplaced third, sixth, seventh rib fractures. Acute anterior displaced right fourth and fifth rib fractures. Multilevel degenerative changes of the spine. CT ABDOMEN PELVIS FINDINGS Hepatobiliary: No focal liver abnormality. Status post cholecystectomy. No biliary dilatation. Pancreas: Diffusely atrophic. No focal lesion. Otherwise normal pancreatic contour. No surrounding inflammatory changes. No main pancreatic ductal dilatation. Spleen: Normal in size without focal abnormality. Adrenals/Urinary Tract: No adrenal nodule bilaterally. No nephrolithiasis and no hydronephrosis. No definite contour-deforming renal mass. No ureterolithiasis or hydroureter. The urinary bladder is unremarkable. Stomach/Bowel: Stomach is within normal limits. No evidence of bowel wall thickening or dilatation. Appendix appears normal. Vascular/Lymphatic: No abdominal aorta or iliac aneurysm. Severe atherosclerotic plaque of the aorta and its branches. No abdominal, pelvic, or inguinal lymphadenopathy. Reproductive: Calcified lesion within the uterine wall likely degenerative uterine fibroid. Otherwise uterus and  bilateral adnexa are unremarkable. Bilateral tubal ligation. Other: At  least small volume upper abdominal high density free fluid extending into the pelvis. No intraperitoneal free gas. No organized fluid collection. Musculoskeletal: No abdominal wall hernia or abnormality. Mild subcutaneus soft tissue edema. No suspicious lytic or blastic osseous lesions. No acute displaced fracture. Multilevel degenerative changes of the spine. IMPRESSION: 1. Diffuse patchy airspace opacities with prominent consolidationswithin the peribronchovascular and dependent portions of the lungs. Finding likely represent a combination of pulmonary edema and infection/inflammation. 2. Trace bilateral pleural effusions. 3. At least small volume upper abdominal high density free fluid extending into the pelvis. Finding could represent hemoperitoneum versus ascites with limited evaluation on this noncontrast study. 4. Acute anterior left 5-9 and right 3-7 rib fractures. 5. Acute nondisplaced sternal fracture. 6. Aortic Atherosclerosis (ICD10-I70.0) including four-vessel coronary calcifications. 7. Endotracheal tube in good position. 8. Enteric tube in good position with loop within the gastric lumen. Consider retracting by 6 cm. 9. Other imaging findings of potential clinical significance: Uterine fibroids Electronically Signed   By: Tish Frederickson M.D.   On: 2021-08-12 21:50     Assessment & Plan: Pt is a 66 y.o. female with a PMHx of diabetes mellitus type 2, hypertension, lymphedema, coronary disease status post history of PCI, diastolic heart failure, hyperlipidemia, chronic lymphedema, chronic kidney disease stage IIIa baseline EGFR 51, who was admitted to [facility] on 2021-08-12 for evaluation of cardiac arrest.  1.  Acute kidney injury/chronic kidney disease stage IIIa baseline EGFR 51/hyperkalemia.  Acute kidney injury now likely secondary to ATN from hypoperfusion given cardiac arrest.  Critical care is currently identifying goals of care.  We are ready to provide renal placement therapy should the  family and primary team want to move forward with aggressive care.  Critical care will be in contact with [place] if dialysis desired by the family.  Case discussed with Dr. Karna Christmas.  May continue to treat hyperkalemia in the interim with either Veltassa or Lokelma.  2.  Acute respiratory failure/cardiac arrest.  Continue supportive care at this time.  3.  Overall prognosis quite guarded.

## 2021-08-12 NOTE — Progress Notes (Signed)
Patient placed on comfort care measures, extubated to room air with no signs of distress. Time of death 94. Confirmed by this RN and Myrtha Mantis., RN.

## 2021-08-12 NOTE — H&P (Addendum)
NAME:  Laura Mccarty, MRN:  [id number], DOB:  18-Jul-1955, LOS: 1 ADMISSION DATE:  August 01, 2021, CONSULTATION DATE:  08/01/2021 REFERRING MD:  Dr. Lenard Lance, CHIEF COMPLAINT:  cardiac arrest   History of Present Illness:  66 year old female presenting to [facility] ED from home via EMS as CPR in progress on 08/01/21.  Pertinent history obtained per ED documentation and husband's report bedside.  Per the patient's husband she has been in her normal state of health.  She has had increasing bilateral swelling in her legs due to her lymphedema, with subsequent skin breakdown.  The patient has been sleeping in a recliner and has had difficulty mobilizing outside of lifting herself slightly off the chair for bedpan placement.  About a week ago she fell on her right knee and family had difficulty getting her back up off the floor.  Husband also reports loose stools for the last 2 days as well as a productive cough for the last day with possibly green/brown sputum.  He reports she is chronically dyspneic with exertion. He denies fever/chills, cough, chest pain or dizziness. Earlier today he had placed her on the bedpan in her recliner.  When she finished she lifted herself up slightly so he can remove the bedpan and clean her.  When she sat back down she partially missed the seat of the chair.  Her husband attempted to block her from falling but was unable to do so because of the weight imbalance.  She slid to the floor possibly hitting her head or shoulder on the chair, ending up on her stomach face down.  At this point she was still conversing with her husband, requested a pillow for her head.  Her husband called EMS and was trying to mobilize her onto her side or back when he realized she had stopped talking.  He noticed her fingers were blue, ran to get a neighbor, and between the 2 people they positioned her on her back.  Once on her back he noticed her lips were blue and gave 2 rescue breaths just as the fire department was  arriving.  Per EMS report to EDP, the fire department provided CPR and ACLS care on arrival with 1 defibrillation for ventricular fibrillation.  Upon EMS arrival the patient remained in ventricular fibrillation requiring defibrillation x 3.  At that point they achieved ROSC, King airway in place.  Patient noticed to be bradycardic, atropine given with decent response and just prior to arrival at [facility] ED, EMS began pacing the patient transcutaneously.  During ACLS in the field patient received epinephrine, calcium & sodium bicarbonate.   07/30/2021- I met with family to have conference regarding in hospital findings. Present in conference was son, husband (poa) and 8 other family members, neurogloist Dr Otelia Limes, Nurse Tammi Klippel Fields RN, and myself we discussed severe critically ill status including post cardiac arrest, severe ARDS,multiple fractures of thorax,  AKI/CKD, myoclonic jerking and anisocoria suggestive of anoxic brain injury, circulatory shock on vasopressor and previous severe comorbidities with anticipation of in-hospital death.  Family appreciative of care.       CXR 2021-08-01: Diffuse bilateral airspace opacities/consolidation, right greater than left with possible small right pleural effusion. CT head without contrast 08/01/21: No acute intracranial abnormality CT chest/abdomen/pelvis without contrast 01-Aug-2021: Diffuse patchy airspace opacities with prominent consolidation within the peribronchovascular and dependent portions of the lungs.  Findings likely represent combination of pulmonary edema and infection/inflammation.  Trace bilateral pleural effusions.  At least small volume upper abdominal high density free  fluid extending into the pelvis.  Finding could represent hemoperitoneum versus ascites with limited evaluation of this noncontrast study.  Acute anterior left 5-9 and right 3-7 rib fractures.  Acute nondisplaced sternal fracture.  Uterine fibroids.  Pertinent  Medical History   T2DM HTN CKD 3a Lymphedema CAD s/p PCI with stent placement ICM Diabetic ulcers HFpEF (LVEF 60% HLD  Significant Hospital Events: Including procedures, antibiotic start and stop dates in addition to other pertinent events   07/15/21: Admit to ICU s/p cardiac arrest requiring emergent intubation July 24, 2021- patient for EEG and brain death evaluation. Patient for neurology consultation. S/p cardiac eval no LHC for now. Will need to meet with family to review very poor prongosis and goals of care.   Interim History / Subjective:  Patient unresponsive, with some myoclonic like intermittent upper body jerking.  No other response to verbal/painful stimuli noted.  Spoke with husband and family bedside discussed plan of care all questions and concerns answered at this time. Discussed imaging results as well as rib fractures and sternal fractures. Patient's husband decided he would not want CPR performed if his wife's heart were to stop, but would want to continue ACLS medications & defibrillations as needed. Objective   Blood pressure (!) 90/58, pulse 64, temperature (!) 93.6 F (34.2 C), resp. rate (!) 22, height 5\' 6"  (1.676 m), weight (!) 185 kg, SpO2 90 %. CVP:  [1 mmHg-4 mmHg] 4 mmHg  Vent Mode: PRVC FiO2 (%):  [80 %-100 %] 100 % Set Rate:  [22 bmp] 22 bmp Vt Set:  [470 mL-550 mL] 470 mL PEEP:  [10 cmH20] 10 cmH20 Plateau Pressure:  [31 cmH20-36 cmH20] 32 cmH20   Intake/Output Summary (Last 24 hours) at July 24, 2021 1057 Last data filed at 07-24-21 1000 Gross per 24 hour  Intake 3149.76 ml  Output 0 ml  Net 3149.76 ml    Filed Weights   07-24-21 0000  Weight: (!) 185 kg    Examination: General: Adult female, critically ill, lying in bed intubated & sedated requiring mechanical ventilation, NAD HEENT: MM pink/moist, anicteric, facial edema, atraumatic- appears to be bleeding from chronic wound on nose, neck supple Neuro: RASS: -5, unable to follow commands, PERRL +3 & sluggish CV:  s1s2 RRR, junctional bradycardia in 40's on monitor, no r/m/g Pulm: Regular, non labored on PRVC @ 100% fio2 & PEEP 10, breath sounds coarse-BUL & diminished-BLL GI: soft, rounded, bs x 4 GU: foley in place, no urine Skin: limited exam, chronic lymphedema BLE, wound on Left heel and Right posterior calf      Extremities: warm/dry, pulses + 2 R/P, +1 edema noted bilateral hands, non pitting chronic edema BLE  Resolved Hospital Problem list     Assessment & Plan:    Cardiac arrest: initial rhythm V-fib in the field per EMS report  Circulatory shock Bilateral Rib Fractures & Sternal Fracture PMHx: CAD s/p PCI, HFpEF, ICM 25-35 minutes downtime, 6-12 min before CPR initiated, received 4 defibrillations, suspected anoxic injury  UDS: pending - candidate for Normothermia Protocol, initiate per protocol if Temp > 37.5 - assess CVP Q 4 h - Continue vasopressors: levophed, add vasopressin, to maintain MAP > 65 -Albumin supplementation - Echocardiogram ordered - Trend troponin, lactic - unable to administer IV contrast due to renal function > BLE [place] to r/o DVT - not a candidate for systemic anticoagulation due to hemoptysis and supratherapeutic INR - Cardiology consulted, appreciate input - Hold preadmission medication: lisinopril & torsemide  2. Acute hypoxic respiratory failure  secondary to compressive atelectasis & suffocation in the setting of a fall PTA  PMHx: morbid obesity - Ventilator settings: PRVC 8 mL/kg, 100% FiO2, 10 PEEP - Wean PEEP and FiO2 for sats greater than 90% - Plateau pressures less than 30 cm H20 - VAP bundle in place - Intermittent chest x-ray & ABG - Daily WUA/ SBT as tolerated - Ensure adequate pulmonary hygiene  - F/u cultures, trend PCT -Bronchodilators as needed  3. Lactic acidosis in the setting of cardiac arrest Leukocytosis secondary to suspected aspiration pneumonia and potential RLE wound infection in the setting of cardiac arrest Lactic:  3.8, Baseline PCT: 0.25, UA: Pending -1.5 L LR bolus initiated, f/u BNP - Supplemental oxygen as needed, to maintain SpO2 > 90% - f/u cultures, trend lactic/ PCT - Daily CBC, monitor WBC/ fever curve - IV antibiotics: Zosyn & vancomycin  - Persistent hypotension consider stress dose steroids  - consult to WOC for chronic leg wounds  AGMA Hyperkalemia Acute Kidney Injury superimposed on CKD Stage 3b secondary to shock in the setting of cardiac arrest Baseline Cr: 1.2-1.6, Cr on admission:1.88, K+ 7.2 - insulin, D 50, sodium bicarbonate amp, f/u K+ - sodium bicarbonate infusion ordered - Strict I/O's: alert provider if UOP < 0.5 mL/kg/hr - gentle IVF hydration  - Daily BMP, replace electrolytes PRN - Avoid nephrotoxic agents as able, ensure adequate renal perfusion  Supratherapeutic INR Hemoptysis Confirmed with husband that the patient has not been on any anticoagulation, only Plavix.  INR greater than 10, PT greater than 90, APTT greater than 160 -Due to concern for DIC, fibrinogen and D-dimer sent. Fibrinogen normal, d-dimer elevated - f/u INR in 4 hours, if still elevated consider vitamin K - CT chest showed consolidation and small bilateral pleural effusions  Transaminitis secondary to shock liver vs infectious process - CT abdomen/pelvis negative for infectious process, suspect shock liver - Trend hepatic function - avoid hepatotoxic agents  Suspected new onset myoclonic activity secondary to suspected anoxic injury in the setting of prolonged cardiac arrest At risk for anoxic encephalopathy. 25-35 minutes downtime, 6-12 min before CPR initiated, received 4 defibrillations, Initial head CT: negative -have stopped all centrally acting medications and will perform EEG to evaluate brain funtion. - PAD protocol in place: (unable to use propofol due to hypotension) midazolam drip & fentanyl drip - RASS goal: -1 - EEG ordered -Neurochecks every 4 - repeat CT head - Neuro  consulted, appreciate input  Type 2 Diabetes Mellitus Hemoglobin A1C: pending - Monitor CBG Q 4 hours - SSI moderate dosing - target range while in ICU: 140-180 - follow ICU hyper/hypo-glycemia protocol  Best Practice (right click and "Reselect all SmartList Selections" daily)  Diet/type: NPO w/ meds via tube DVT prophylaxis: SCD GI prophylaxis: H2B Lines: Central line and yes and it is still needed Foley:  Yes, and it is still needed Code Status:  limited Last date of multidisciplinary goals of care discussion [12-Aug-2021]  Labs   CBC: Recent Labs  Lab 2021-08-12 1654 2021-08-12 2257 08/05/2021 0527  WBC 22.1*  --  17.4*  HGB 9.3* 8.1* 8.3*  HCT 34.0* 28.2* 28.5*  MCV 116.8*  --  109.2*  PLT 229  --  202     Basic Metabolic Panel: Recent Labs  Lab 08-12-21 1810 2021/08/12 2257 07/22/2021 0527  NA 139  --  138  K 7.2* 6.0* 5.7*  CL 120*  --  116*  CO2 15*  --  17*  GLUCOSE 155*  --  143*  BUN 41*  --  45*  CREATININE 1.88*  --  1.95*  CALCIUM 10.6*  --  8.3*  MG 2.7*  --  2.2  PHOS 6.6*  --  4.4    GFR: Estimated Creatinine Clearance: 50.4 mL/min (A) (by C-G formula based on SCr of 1.95 mg/dL (H)). Recent Labs  Lab 2021-07-23 1654 Jul 23, 2021 1917 2021-07-23 2257 07/14/2021 0527 07/26/2021 0758  PROCALCITON  --  0.25  --  10.27  --   WBC 22.1*  --   --  17.4*  --   LATICACIDVEN  --  3.8* 3.8* 3.8* 3.5*     Liver Function Tests: Recent Labs  Lab July 23, 2021 1810 07/20/2021 0527  AST 60* 59*  ALT 28 27  ALKPHOS 162* 125  BILITOT 1.0 1.5*  PROT 7.1 6.0*  ALBUMIN 2.8* 2.7*    No results for input(s): LIPASE, AMYLASE in the last 168 hours. No results for input(s): AMMONIA in the last 168 hours.  ABG    Component Value Date/Time   PHART 7.19 (LL) 07/19/2021 0447   PCO2ART 43 07/29/2021 0447   PO2ART 121 (H) 07/25/2021 0447   HCO3 17.5 (L) 08/11/2021 0808   ACIDBASEDEF 11.0 (H) 08/07/2021 0808   O2SAT 87.7 08/09/2021 0808      Coagulation Profile: Recent Labs   Lab 2021/07/23 1810 2021/07/23 2257  INR >10.0* 1.5*     Cardiac Enzymes: No results for input(s): CKTOTAL, CKMB, CKMBINDEX, TROPONINI in the last 168 hours.  HbA1C: Hgb A1c MFr Bld  Date/Time Value Ref Range Status  07-23-21 10:57 PM 5.9 (H) 4.8 - 5.6 % Final    Comment:    (NOTE) Pre diabetes:          5.7%-6.4%  Diabetes:              >6.4%  Glycemic control for   <7.0% adults with diabetes     CBG: Recent Labs  Lab 07/23/21 1920 07/23/2021 2134 2021-07-23 2337 08/04/2021 0352 08/06/2021 0749  GLUCAP 165* 155* 111* 130* 137*     Review of Systems:   UTA-patient intubated and unresponsive in the setting of recent cardiac arrest  Past Medical History:  She,  has no past medical history on file.  Merged chart reviewed  Surgical History:  Merged chart reviewed  Social History:  See merged chart  Family History:  Her family history is not on file.  Merged chart reviewed  Allergies Allergies  Allergen Reactions   Codeine Nausea Only     Home Medications  Prior to Admission medications   Not on File     Critical care provider statement:   Total critical care time: 33 minutes   Performed by: Karna Christmas MD   Critical care time was exclusive of separately billable procedures and treating other patients.   Critical care was necessary to treat or prevent imminent or life-threatening deterioration.   Critical care was time spent personally by me on the following activities: development of treatment plan with patient and/or surrogate as well as nursing, discussions with consultants, evaluation of patient's response to treatment, examination of patient, obtaining history from patient or surrogate, ordering and performing treatments and interventions, ordering and review of laboratory studies, ordering and review of radiographic studies, pulse oximetry and re-evaluation of patient's condition.    Vida Rigger, M.D.  Pulmonary & Critical Care Medicine

## 2021-08-12 NOTE — Consult Note (Addendum)
[facility]  CARDIOLOGY CONSULT NOTE  Patient ID: Laura Mccarty MRN: [id number] DOB/AGE: 1955/12/14 66 y.o.  Admit date: 14-Aug-2021 Referring Physician Karna Christmas Primary Physician  Primary Cardiologist [facility] Reason for Consultation cardiopulmonary arrest, bradycardia  HPI: 66 year old female referred for cardiopulmonary arrest and bradycardia.  The patient has known coronary artery disease, status post DES LAD 2015.  She is followed by Dr. Juliann Pares, recently seen in the office 06/30/2021 at which time she reported her pulse ox revealed heart rates 43-55, ECG revealing junctional bradycardia with minimal symptoms, and carvedilol was discontinued.  The patient has several week history of poor health, sleeping in a recliner, with persistent lymphedema, skin breakdown, if occult mobility, 2-day history of productive cough, exertional dyspnea.  Cording to the husband, the patient was placed on her bedpan, and after removal, sat back in her recliner, fell to the floor, became unresponsive, stopped breathing, downtime without effective CPR 6 to 12 minutes, called the fire department and EMS, where upon arrival she was shocked multiple times, with ROSC, noted to be bradycardic, treated with atropine, and in total, received CPR and ACLS for approximately 25 to 35 minutes.  Patient brought to [facility] ED, initial ECG revealed junctional bradycardia with intraventricular conduction delay of 60 bpm.  Patient admitted to ICU, remains on ventilator, unresponsive.  Hypotensive, on vasopressin, Levophed and dopamine.  Lab work notable for lactic acidosis, placed on Zosyn and vancomycin.  Following CPR, ECG revealed atrial fibrillation at a rate of 110 bpm.  Today, telemetry reveals junctional rhythm/atrial fibrillation at a rate of 60 bpm.  Troponin mildly elevated 166, 904, without acute ischemic ST-T wave changes on ECG.  Lab work also notable for acute kidney injury, BUN and creatinine 41 and 1.88, respectively.  The  patient has underlying CKD stage IIIb.  Physical examination also revealed neck bilateral lymphedema with skin breakdown and wound on right posterior calf and left heel.  Review of systems complete and found to be negative unless listed above      Medications Prior to Admission  Medication Sig Dispense Refill Last Dose   aspirin EC 81 MG tablet Take 81 mg by mouth daily.   Unknown at Unknown   atorvastatin (LIPITOR) 80 MG tablet Take 80 mg by mouth at bedtime.   Unknown   clopidogrel (PLAVIX) 75 MG tablet Take 75 mg by mouth daily.   Unknown at Unknown   Dulaglutide (TRULICITY) 0.75 MG/0.5ML SOPN Inject 0.75 mg into the skin once a week.   Unknown at Unknown   gabapentin (NEURONTIN) 100 MG capsule Take 300 mg by mouth at bedtime.   Unknown at Unknown   glipiZIDE (GLUCOTROL XL) 10 MG 24 hr tablet Take 10 mg by mouth daily with breakfast.   Unknown at Unknown   lisinopril (ZESTRIL) 20 MG tablet Take 20 mg by mouth daily.   Unknown at Unknown   torsemide (DEMADEX) 20 MG tablet Take 40 mg by mouth daily.   Unknown at Unknown   Social History   Socioeconomic History   Marital status: Married    Spouse name: Not on file   Number of children: Not on file   Years of education: Not on file   Highest education level: Not on file  Occupational History   Not on file  Tobacco Use   Smoking status: Not on file   Smokeless tobacco: Not on file  Substance and Sexual Activity   Alcohol use: Not on file   Drug use: Not on file   Sexual  activity: Not on file  Other Topics Concern   Not on file  Social History Narrative   Not on file   Social Determinants of Health   Financial Resource Strain: Not on file  Food Insecurity: Not on file  Transportation Needs: Not on file  Physical Activity: Not on file  Stress: Not on file  Social Connections: Not on file  Intimate Partner Violence: Not on file    No family history on file.    Review of systems complete and found to be negative unless  listed above      PHYSICAL EXAM  General: Well developed, well nourished, in no acute distress HEENT:  Normocephalic and atramatic Neck:  No JVD.  Lungs: Clear bilaterally to auscultation and percussion. Heart: HRRR . Normal S1 and S2 without gallops or murmurs.  Abdomen: Bowel sounds are positive, abdomen soft and non-tender  Msk:  Back normal, normal gait. Normal strength and tone for age. Extremities: No clubbing, cyanosis or edema.   Neuro: Alert and oriented X 3. Psych:  Good affect, responds appropriately  Labs:   Lab Results  Component Value Date   WBC 17.4 (H) 07/25/2021   HGB 8.3 (L) 07/29/2021   HCT 28.5 (L) 08/03/2021   MCV 109.2 (H) 07/23/2021   PLT 202 08/03/2021    Recent Labs  Lab 08/05/2021 0527  NA 138  K 5.7*  CL 116*  CO2 17*  BUN 45*  CREATININE 1.95*  CALCIUM 8.3*  PROT 6.0*  BILITOT 1.5*  ALKPHOS 125  ALT 27  AST 59*  GLUCOSE 143*   No results found for: CKTOTAL, CKMB, CKMBINDEX, TROPONINI No results found for: CHOL No results found for: HDL No results found for: LDLCALC No results found for: TRIG No results found for: CHOLHDL No results found for: LDLDIRECT    Radiology: DG Abd 1 View  Result Date: 2021/08/07 CLINICAL DATA:  OG tube placement EXAM: ABDOMEN - 1 VIEW COMPARISON:  08/07/21 FINDINGS: Esophageal tube tip overlies the distal stomach. Upper gas pattern is unremarkable IMPRESSION: Esophageal tube tip overlies the distal stomach Electronically Signed   By: Jasmine Pang M.D.   On: Aug 07, 2021 20:08   CT HEAD WO CONTRAST ( )  Result Date: 08/07/2021 CLINICAL DATA:  Cardiac arrest.  Mental status change. EXAM: CT HEAD WITHOUT CONTRAST TECHNIQUE: Contiguous axial images were obtained from the base of the skull through the vertex without intravenous contrast. RADIATION DOSE REDUCTION: This exam was performed according to the departmental dose-optimization program which includes automated exposure control, adjustment of the mA and/or  kV according to patient size and/or use of iterative reconstruction technique. COMPARISON:  None Available. FINDINGS: Brain: No intracranial hemorrhage, mass effect, or midline shift. No hydrocephalus. The basilar cisterns are patent. No evidence of territorial infarct or acute ischemia. Gray-white differentiation is preserved. No extra-axial or intracranial fluid collection. Vascular: Atherosclerosis of skullbase vasculature without hyperdense vessel or abnormal calcification. Skull: No fracture or focal lesion. Sinuses/Orbits: Intubation. Mucosal thickening throughout the paranasal sinuses and small fluid levels in the maxillary and sphenoid sinus may be related to intubation. No mastoid effusion. Other: None. IMPRESSION: No acute intracranial abnormality. Electronically Signed   By: Narda Rutherford M.D.   On: 08/07/2021 21:42   DG Chest Port 1 View  Result Date: 08/04/2021 CLINICAL DATA:  Ventilator dependent respiratory failure. EXAM: PORTABLE CHEST 1 VIEW COMPARISON:  Portable chest yesterday at 10:34 p.m., chest CT yesterday at 8:57 p.m. FINDINGS: 6:07 a.m., 07/22/2021. A partially lucent electrical pad overlies  left chest. ETT tip 3.2 cm from the carina. NGT is well inside the stomach, the distal end directed cephalad just beneath the crest of the left hemidiaphragm. Left IJ central line terminates in the upper SVC. There is extensive diffuse patchy dense airspace consolidation throughout the lungs, small pleural effusions. Currently the opacities are more dense and confluent than previously in the left upper lobe and lower lateral left lung, unchanged on the right. There is aortic atherosclerosis. The heart enlarged. Central vascular prominence is similar. Bilateral anterior ribcage fractures were better demonstrated on CT. No pneumothorax is seen. IMPRESSION: Worsening airspace disease in the left upper and lateral left lower lung fields. No change in the right-sided airspace disease. Persistent central  vascular prominence. Small pleural effusions are unchanged. Electronically Signed   By: Almira Bar M.D.   On: 08/04/2021 06:31   DG Chest Port 1 View  Result Date: 07/15/2021 CLINICAL DATA:  Central line placement. EXAM: PORTABLE CHEST 1 VIEW COMPARISON:  Radiograph and CT earlier today. FINDINGS: New left internal jugular central venous catheter tip overlies the brachiocephalic confluence. Endotracheal tube tip approximately 3.2 cm from the carina. Enteric tube tip below the diaphragm not included in the field of view. Dense consolidation in both lungs, progressing in the right upper lung zone. No pneumothorax. Grossly stable heart size. IMPRESSION: 1. New left internal jugular central venous catheter with tip overlying the brachiocephalic confluence. No pneumothorax. 2. Endotracheal tube tip approximately 3.2 cm from the carina. Enteric tube tip below the diaphragm not included in the field of view. 3. Dense bilateral airspace disease, with progression in the right upper lung zone. Electronically Signed   By: Narda Rutherford M.D.   On: 07/15/2021 23:03   DG Chest Port 1 View  Result Date: 07/15/2021 CLINICAL DATA:  Respiratory distress and altered mental status. EXAM: PORTABLE CHEST 1 VIEW COMPARISON:  None Available. FINDINGS: Endotracheal tube is identified 2.5 cm above the carina. Diffuse bilateral airspace opacities/consolidation noted, RIGHT greater than LEFT. There may be a small RIGHT pleural effusion present. No pneumothorax noted. Defibrillator/pacing pads overlying the chest are noted. IMPRESSION: Diffuse bilateral airspace opacities/consolidation, RIGHT greater than LEFT, with possible small RIGHT pleural effusion. Endotracheal tube with tip 2.5 cm above the carina. Electronically Signed   By: Harmon Pier M.D.   On: 07/15/2021 17:31   DG Abd Portable 1V  Result Date: 07/15/2021 CLINICAL DATA:  OG tube placement. EXAM: PORTABLE ABDOMEN - 1 VIEW COMPARISON:  None Available. FINDINGS: Enteric  tube noted with tip overlying the proximal stomach and side hole near the esophagogastric junction. IMPRESSION: Enteric tube with tip overlying the proximal stomach and side hole near the esophagogastric junction. Recommend advancement. Electronically Signed   By: Harmon Pier M.D.   On: 07/15/2021 17:32   CT CHEST ABDOMEN PELVIS WO CONTRAST  Result Date: 07/15/2021 CLINICAL DATA:  Hemoptysis.  Cardiac arrest EXAM: CT CHEST, ABDOMEN AND PELVIS WITHOUT CONTRAST TECHNIQUE: Multidetector CT imaging of the chest, abdomen and pelvis was performed following the standard protocol without IV contrast. RADIATION DOSE REDUCTION: This exam was performed according to the departmental dose-optimization program which includes automated exposure control, adjustment of the mA and/or kV according to patient size and/or use of iterative reconstruction technique. COMPARISON:  None Available. FINDINGS: CT CHEST FINDINGS Lines and tubes: Endotracheal tube terminates 3.5 cm above the carina. Enteric tube with tip and side port within the gastric lumen. The enteric tube is noted to loop once within the gastric lumen. Cardiovascular: Normal heart  size. No significant pericardial effusion. The thoracic aorta is normal in caliber. Mild-to-moderate atherosclerotic plaque of the thoracic aorta. Four-vessel coronary artery coronary artery calcifications. Mediastinum/Nodes: No gross hilar adenopathy, noting limited sensitivity for the detection of hilar adenopathy on this noncontrast study. no enlarged mediastinal, hilar, or axillary lymph nodes. Thyroid gland, trachea, and esophagus demonstrate no significant findings. Lungs/Pleura: Diffuse patchy airspace opacities with prominent consolidations with air bronchograms within the peribronchovascular regions and dependent portions of the lungs. Limited evaluation for pulmonary mass. Likely trace bilateral pleural effusions. No pneumothorax. Musculoskeletal: No chest wall abnormality. No  suspicious lytic or blastic osseous lesions. Acute nondisplaced sternal fracture. Acute anterior nondisplaced left sixth, seventh, eighth, ninth rib fractures. Acute anterior displaced left fifth rib fracture. Acute right anterior nondisplaced third, sixth, seventh rib fractures. Acute anterior displaced right fourth and fifth rib fractures. Multilevel degenerative changes of the spine. CT ABDOMEN PELVIS FINDINGS Hepatobiliary: No focal liver abnormality. Status post cholecystectomy. No biliary dilatation. Pancreas: Diffusely atrophic. No focal lesion. Otherwise normal pancreatic contour. No surrounding inflammatory changes. No main pancreatic ductal dilatation. Spleen: Normal in size without focal abnormality. Adrenals/Urinary Tract: No adrenal nodule bilaterally. No nephrolithiasis and no hydronephrosis. No definite contour-deforming renal mass. No ureterolithiasis or hydroureter. The urinary bladder is unremarkable. Stomach/Bowel: Stomach is within normal limits. No evidence of bowel wall thickening or dilatation. Appendix appears normal. Vascular/Lymphatic: No abdominal aorta or iliac aneurysm. Severe atherosclerotic plaque of the aorta and its branches. No abdominal, pelvic, or inguinal lymphadenopathy. Reproductive: Calcified lesion within the uterine wall likely degenerative uterine fibroid. Otherwise uterus and bilateral adnexa are unremarkable. Bilateral tubal ligation. Other: At least small volume upper abdominal high density free fluid extending into the pelvis. No intraperitoneal free gas. No organized fluid collection. Musculoskeletal: No abdominal wall hernia or abnormality. Mild subcutaneus soft tissue edema. No suspicious lytic or blastic osseous lesions. No acute displaced fracture. Multilevel degenerative changes of the spine. IMPRESSION: 1. Diffuse patchy airspace opacities with prominent consolidationswithin the peribronchovascular and dependent portions of the lungs. Finding likely represent a  combination of pulmonary edema and infection/inflammation. 2. Trace bilateral pleural effusions. 3. At least small volume upper abdominal high density free fluid extending into the pelvis. Finding could represent hemoperitoneum versus ascites with limited evaluation on this noncontrast study. 4. Acute anterior left 5-9 and right 3-7 rib fractures. 5. Acute nondisplaced sternal fracture. 6. Aortic Atherosclerosis (ICD10-I70.0) including four-vessel coronary calcifications. 7. Endotracheal tube in good position. 8. Enteric tube in good position with loop within the gastric lumen. Consider retracting by 6 cm. 9. Other imaging findings of potential clinical significance: Uterine fibroids Electronically Signed   By: Tish Frederickson M.D.   On: 07/15/2021 21:50    EKG: Junctional rhythm with interventricular conduction delay, heart rate 60 bpm  ASSESSMENT AND PLAN:   1.  Cardiopulmonary arrest, with 6 to 12-minute downtime, 25 to 35-minute ACLS, CPR, defibrillation x4 2.  Respiratory failure, on ventilator 3.  Intermittent bradycardia, junctional bradycardia/atrial fibrillation with slow ventricular rate, of uncertain clinical significance, on multiple pressors.  Patient recently seen as outpatient, with reported heart rates 43 to 55 bpm, hemodynamically stable, carvedilol discontinued. 4.  Coronary artery disease, status post DES LAD 2015, without recent chest pain, without ischemic T wave changes on ECG 5.  Mildly elevated troponin, most consistent with demand supply ischemia secondary to cardiopulmonary arrest and multiple defibrillations 6.  Anoxic brain injury, unresponsive, myoclonic activity, due to prolonged downtime 7.  Lactic acidosis, suspected aspiration pneumonia, right lower extremity wound  8.  Acute kidney injury/CKD stage IIIb 9.  Hyperkalemia  Recommendations  1.  Agree with current therapy 2.  Defer invasive cardiac evaluation at this time, in light of absence of ischemic ECG changes,  prolonged downtime, apparent anoxic brain injury 3.  Defer temporary transvenous pacemaker at this time, patient with known junctional bradycardia/atrial fibrillation with slow ventricular rate, with heart rate 60 bpm, consistent with or greater than patient's baseline heart rate 4.  Defer full dose anticoagulation 5.  Review 2D echocardiogram 6.  Further recommendations pending patient's initial clinical course and 2D echocardiogram results  Signed: Marcina Millard MD,PhD, [facility] 2021-07-17, 8:59 AM

## 2021-08-12 NOTE — Progress Notes (Signed)
Received patient from ER at approximately 2230 last night. Critically ill, hemodynamically unstable, bradycardic. Discussed with NP and charge nurse about turning patient to assess back, both agreed that patient is too unstable at this time to move that much. At 2:28 this morning heart rate dropped as low as 18 again, see NP note. Currently pulse is 87 and blood pressure is 153/48 due to levophed, vasopressin, and dopamine gtts.

## 2021-08-12 NOTE — Death Summary Note (Signed)
DEATH SUMMARY   Patient Details  Name: Laura Mccarty MRN: [id number] DOB: 12-16-1955  Admission/Discharge Information   Admit Date:  July 17, 2021  Date of Death: Jul 18, 2021  Time of Death: 1740  Length of Stay: 1  Referring Physician: [facility]   Reason(s) for Hospitalization  Cardiac Arrest   Diagnoses  Preliminary cause of death: Cardiac arrest [facility]) Secondary Diagnoses (including complications and co-morbidities):  Principal Problem:   Cardiac arrest (HCC)   Acute hypoxic respiratory failure secondary to compressive    Atelectasis, suffocation following a fall, and aspiration pneumonia   HFpEF    Bilateral rib fractures and sternal fractures    Acute kidney injury superimposed on stage 3a CKD with    hyperkalemia    Severe metabolic acidosis    Cardiogenic shock    Junctional bradycardia    Transaminitis secondary to shock liver    Possible infected right lower extremity wound    Lymphedema    Morbid obesity    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 year old female with a PMHx of diabetes mellitus type 2, hypertension, lymphedema, coronary disease status post history of PCI, diastolic heart failure, hyperlipidemia, chronic lymphedema, chronic kidney disease stage IIIa baseline EGFR 51, who was admitted to [facility] on 07/17/21 for evaluation of cardiac arrest. She was brought to the emergency department on 07-17-21 and was an active CPR on route.  Recently she apparently had increasing bilateral lower extremity edema due to lymphedema along with a 2 day history of loose stools and a 1 day hx of congestion/cough with brown/greenish sputum production.  She apparently had been sleeping in a recliner and has had difficulty mobilizing.  She apparently experienced a fall on 07-17-21, and landed in a  prone position on the floor, and thereafter became unresponsive and cyanotic.  Her husband had difficulty turning her back in a supine position, and had to leave the residence to run to a neighbors home to get assistance.  He returned to the home with the neighbor and positioned the pt on her back administering 2 rescue breaths during which time the fire department arrived at the scene.    The fire department provided CPR and ACLS care on arrival with 1 defibrillation for ventricular fibrillation.  Upon EMS arrival the patient remained in ventricular fibrillation requiring defibrillation x 3.  At that point they achieved ROSC and [facility] airway placed.  Patient noticed to be bradycardic, atropine given with decent response and just prior to arrival at [facility] ED, EMS began pacing the patient transcutaneously.  During ACLS in the field patient received epinephrine, calcium & sodium bicarbonate.  On arrival to the ER pt became pulseless again and CPR initiated with achievement of ROSC.  Pt emergently intubated by ER provider.  PCCM team contacted for ICU admission. Lab results revealed pt in multiorgan failure with worsening acute kidney injury superimposed by stage 3a CKD with severe metabolic acidosis and hyperkalemia despite sodium bicarb gtt and medication management of hyperkalemia.  Pt also requiring vasopressin, levophed, and neo-synephrine gtts to stabilize blood pressure and cardiac rhythm.  Pt also showed signs of severe anoxic brain injury with myoclonic seizures; lack of gag/corneal reflexes; and unresponsive to any form of stimulation. EEG confirmed anoxic hypoxic brain injury with myoclonic seizures.  Following goals of care discussion with family regarding poor  prognosis on Jul 21, 2021 pts code status changed to DNR and transitioned to comfort measures only per family request.  Laura Mccarty expired on 07-21-21 at 1740.  Pertinent Labs and Studies  Significant Diagnostic Studies DG Abd 1 View  Result  Date: 07/15/2021 CLINICAL DATA:  OG tube placement EXAM: ABDOMEN - 1 VIEW COMPARISON:  07/15/2021 FINDINGS: Esophageal tube tip overlies the distal stomach. Upper gas pattern is unremarkable IMPRESSION: Esophageal tube tip overlies the distal stomach Electronically Signed   By: Jasmine Pang M.D.   On: 07/15/2021 20:08   CT HEAD WO CONTRAST ( )  Result Date: 07/15/2021 CLINICAL DATA:  Cardiac arrest.  Mental status change. EXAM: CT HEAD WITHOUT CONTRAST TECHNIQUE: Contiguous axial images were obtained from the base of the skull through the vertex without intravenous contrast. RADIATION DOSE REDUCTION: This exam was performed according to the departmental dose-optimization program which includes automated exposure control, adjustment of the mA and/or kV according to patient size and/or use of iterative reconstruction technique. COMPARISON:  None Available. FINDINGS: Brain: No intracranial hemorrhage, mass effect, or midline shift. No hydrocephalus. The basilar cisterns are patent. No evidence of territorial infarct or acute ischemia. Gray-white differentiation is preserved. No extra-axial or intracranial fluid collection. Vascular: Atherosclerosis of skullbase vasculature without hyperdense vessel or abnormal calcification. Skull: No fracture or focal lesion. Sinuses/Orbits: Intubation. Mucosal thickening throughout the paranasal sinuses and small fluid levels in the maxillary and sphenoid sinus may be related to intubation. No mastoid effusion. Other: None. IMPRESSION: No acute intracranial abnormality. Electronically Signed   By: Narda Rutherford M.D.   On: 07/15/2021 21:42   US Venous Img Lower Bilateral (DVT)  Result Date: Jul 21, 2021 CLINICAL DATA:  Evaluate for DVT. EXAM: BILATERAL LOWER EXTREMITY VENOUS DOPPLER ULTRASOUND TECHNIQUE: Gray-scale sonography with graded compression, as well as color Doppler and duplex ultrasound were performed to evaluate the lower extremity deep venous systems from the  level of the common femoral vein and including the common femoral, femoral, profunda femoral, popliteal and calf veins including the posterior tibial, peroneal and gastrocnemius veins when visible. The superficial great saphenous vein was also interrogated. Spectral Doppler was utilized to evaluate flow at rest and with distal augmentation maneuvers in the common femoral, femoral and popliteal veins. COMPARISON:  None Available. FINDINGS: Examination is degraded due to patient body habitus and poor sonographic window. RIGHT LOWER EXTREMITY Common Femoral Vein: No evidence of thrombus. Normal compressibility, respiratory phasicity and response to augmentation. Saphenofemoral Junction: No evidence of thrombus. Normal compressibility and flow on color Doppler imaging. Profunda Femoral Vein: No evidence of thrombus. Normal compressibility and flow on color Doppler imaging. Femoral Vein: No evidence of thrombus. Normal compressibility, respiratory phasicity and response to augmentation. Popliteal Vein: Not well visualized. Calf Veins: Appear patent where visualized. Superficial Great Saphenous Vein: No evidence of thrombus. Normal compressibility. Other Findings: There is a minimal amount of subcutaneous edema at the level of the popliteal fossa without definitive Baker's cyst. LEFT LOWER EXTREMITY Common Femoral Vein: No evidence of thrombus. Normal compressibility, respiratory phasicity and response to augmentation. Saphenofemoral Junction: No evidence of thrombus. Normal compressibility and flow on color Doppler imaging. Profunda Femoral Vein: No evidence of thrombus. Normal compressibility and flow on color Doppler imaging. Femoral Vein: Appears patent where visualized. Popliteal Vein: Not well visualized. Calf Veins: Not well visualized. Superficial Great Saphenous Vein: No evidence of thrombus. Normal compressibility. Other Findings:  None. IMPRESSION: No definite evidence of DVT on this body habitus degraded  examination. Electronically Signed   By: Jonny Ruiz  Watts M.D.   On: 29-Jul-2021 13:44   DG Chest Port 1 View  Result Date: 07/29/21 CLINICAL DATA:  Ventilator dependent respiratory failure. EXAM: PORTABLE CHEST 1 VIEW COMPARISON:  Portable chest yesterday at 10:34 p.m., chest CT yesterday at 8:57 p.m. FINDINGS: 6:07 a.m., 29-Jul-2021. A partially lucent electrical pad overlies left chest. ETT tip 3.2 cm from the carina. NGT is well inside the stomach, the distal end directed cephalad just beneath the crest of the left hemidiaphragm. Left IJ central line terminates in the upper SVC. There is extensive diffuse patchy dense airspace consolidation throughout the lungs, small pleural effusions. Currently the opacities are more dense and confluent than previously in the left upper lobe and lower lateral left lung, unchanged on the right. There is aortic atherosclerosis. The heart enlarged. Central vascular prominence is similar. Bilateral anterior ribcage fractures were better demonstrated on CT. No pneumothorax is seen. IMPRESSION: Worsening airspace disease in the left upper and lateral left lower lung fields. No change in the right-sided airspace disease. Persistent central vascular prominence. Small pleural effusions are unchanged. Electronically Signed   By: Almira Bar M.D.   On: 07-29-21 06:31   DG Chest Port 1 View  Result Date: 07/15/2021 CLINICAL DATA:  Central line placement. EXAM: PORTABLE CHEST 1 VIEW COMPARISON:  Radiograph and CT earlier today. FINDINGS: New left internal jugular central venous catheter tip overlies the brachiocephalic confluence. Endotracheal tube tip approximately 3.2 cm from the carina. Enteric tube tip below the diaphragm not included in the field of view. Dense consolidation in both lungs, progressing in the right upper lung zone. No pneumothorax. Grossly stable heart size. IMPRESSION: 1. New left internal jugular central venous catheter with tip overlying the brachiocephalic  confluence. No pneumothorax. 2. Endotracheal tube tip approximately 3.2 cm from the carina. Enteric tube tip below the diaphragm not included in the field of view. 3. Dense bilateral airspace disease, with progression in the right upper lung zone. Electronically Signed   By: Narda Rutherford M.D.   On: 07/15/2021 23:03   DG Chest Port 1 View  Result Date: 07/15/2021 CLINICAL DATA:  Respiratory distress and altered mental status. EXAM: PORTABLE CHEST 1 VIEW COMPARISON:  None Available. FINDINGS: Endotracheal tube is identified 2.5 cm above the carina. Diffuse bilateral airspace opacities/consolidation noted, RIGHT greater than LEFT. There may be a small RIGHT pleural effusion present. No pneumothorax noted. Defibrillator/pacing pads overlying the chest are noted. IMPRESSION: Diffuse bilateral airspace opacities/consolidation, RIGHT greater than LEFT, with possible small RIGHT pleural effusion. Endotracheal tube with tip 2.5 cm above the carina. Electronically Signed   By: Harmon Pier M.D.   On: 07/15/2021 17:31   DG Abd Portable 1V  Result Date: 07/15/2021 CLINICAL DATA:  OG tube placement. EXAM: PORTABLE ABDOMEN - 1 VIEW COMPARISON:  None Available. FINDINGS: Enteric tube noted with tip overlying the proximal stomach and side hole near the esophagogastric junction. IMPRESSION: Enteric tube with tip overlying the proximal stomach and side hole near the esophagogastric junction. Recommend advancement. Electronically Signed   By: Harmon Pier M.D.   On: 07/15/2021 17:32   EEG adult  Result Date: 29-Jul-2021 Charlsie Quest, MD     07/29/21  1:29 PM Patient Name: Ignacia Criss MRN: [id number] Epilepsy Attending: Charlsie Quest Referring Physician/Provider: Vida Rigger, MD Date: July 29, 2021 Duration: 27.20 mins Patient history: 66yo F s/p cardiac arrest now with jerking. EEG to evaluate for seizure Level of alertness: comatose AEDs during EEG study: None Technical aspects: This EEG study was done  with scalp  electrodes positioned according to the 10-20 International system of electrode placement. Electrical activity was acquired at a sampling rate of 500Hz  and reviewed with a high frequency filter of 70Hz  and a low frequency filter of 1Hz . EEG data were recorded continuously and digitally stored. Description: Patient was noted to have brief jerking of shoulders every few seconds Concomitant eeg showed generalized polyspikes consistent with myoclonic seizure. In between seizure, eeg showed burst suppression with highly epileptiform bursts lasting 0.5-2 seconds as well as 1-3 seconds of generalized eeg suppression. Hyperventilation and photic stimulation were not performed.   ABNORMALITY -Myoclonic seizure, generalized -Burst suppression with highly epileptiform bursts, generalized IMPRESSION: This study showed evidence of myoclonic seizures every few seconds as well as profound diffuse encephalopathy. 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 Dr Karna Christmas was notified. Charlsie Quest   CT CHEST ABDOMEN PELVIS WO CONTRAST  Result Date: 07/15/2021 CLINICAL DATA:  Hemoptysis.  Cardiac arrest EXAM: CT CHEST, ABDOMEN AND PELVIS WITHOUT CONTRAST TECHNIQUE: Multidetector CT imaging of the chest, abdomen and pelvis was performed following the standard protocol without IV contrast. RADIATION DOSE REDUCTION: This exam was performed according to the departmental dose-optimization program which includes automated exposure control, adjustment of the mA and/or kV according to patient size and/or use of iterative reconstruction technique. COMPARISON:  None Available. FINDINGS: CT CHEST FINDINGS Lines and tubes: Endotracheal tube terminates 3.5 cm above the carina. Enteric tube with tip and side port within the gastric lumen. The enteric tube is noted to loop once within the gastric lumen. Cardiovascular: Normal heart size. No significant pericardial effusion. The thoracic aorta is  normal in caliber. Mild-to-moderate atherosclerotic plaque of the thoracic aorta. Four-vessel coronary artery coronary artery calcifications. Mediastinum/Nodes: No gross hilar adenopathy, noting limited sensitivity for the detection of hilar adenopathy on this noncontrast study. no enlarged mediastinal, hilar, or axillary lymph nodes. Thyroid gland, trachea, and esophagus demonstrate no significant findings. Lungs/Pleura: Diffuse patchy airspace opacities with prominent consolidations with air bronchograms within the peribronchovascular regions and dependent portions of the lungs. Limited evaluation for pulmonary mass. Likely trace bilateral pleural effusions. No pneumothorax. Musculoskeletal: No chest wall abnormality. No suspicious lytic or blastic osseous lesions. Acute nondisplaced sternal fracture. Acute anterior nondisplaced left sixth, seventh, eighth, ninth rib fractures. Acute anterior displaced left fifth rib fracture. Acute right anterior nondisplaced third, sixth, seventh rib fractures. Acute anterior displaced right fourth and fifth rib fractures. Multilevel degenerative changes of the spine. CT ABDOMEN PELVIS FINDINGS Hepatobiliary: No focal liver abnormality. Status post cholecystectomy. No biliary dilatation. Pancreas: Diffusely atrophic. No focal lesion. Otherwise normal pancreatic contour. No surrounding inflammatory changes. No main pancreatic ductal dilatation. Spleen: Normal in size without focal abnormality. Adrenals/Urinary Tract: No adrenal nodule bilaterally. No nephrolithiasis and no hydronephrosis. No definite contour-deforming renal mass. No ureterolithiasis or hydroureter. The urinary bladder is unremarkable. Stomach/Bowel: Stomach is within normal limits. No evidence of bowel wall thickening or dilatation. Appendix appears normal. Vascular/Lymphatic: No abdominal aorta or iliac aneurysm. Severe atherosclerotic plaque of the aorta and its branches. No abdominal, pelvic, or inguinal  lymphadenopathy. Reproductive: Calcified lesion within the uterine wall likely degenerative uterine fibroid. Otherwise uterus and bilateral adnexa are unremarkable. Bilateral tubal ligation. Other: At least small volume upper abdominal high density free fluid extending into the pelvis. No intraperitoneal free gas. No organized fluid collection. Musculoskeletal: No abdominal wall hernia or abnormality. Mild subcutaneus soft tissue edema. No suspicious lytic or blastic osseous lesions. No acute displaced fracture.  Multilevel degenerative changes of the spine. IMPRESSION: 1. Diffuse patchy airspace opacities with prominent consolidationswithin the peribronchovascular and dependent portions of the lungs. Finding likely represent a combination of pulmonary edema and infection/inflammation. 2. Trace bilateral pleural effusions. 3. At least small volume upper abdominal high density free fluid extending into the pelvis. Finding could represent hemoperitoneum versus ascites with limited evaluation on this noncontrast study. 4. Acute anterior left 5-9 and right 3-7 rib fractures. 5. Acute nondisplaced sternal fracture. 6. Aortic Atherosclerosis (ICD10-I70.0) including four-vessel coronary calcifications. 7. Endotracheal tube in good position. 8. Enteric tube in good position with loop within the gastric lumen. Consider retracting by 6 cm. 9. Other imaging findings of potential clinical significance: Uterine fibroids Electronically Signed   By: Tish Frederickson M.D.   On: 07/15/2021 21:50    Microbiology Recent Results (from the past 240 hour(s))  Respiratory (~20 pathogens) panel by PCR     Status: Abnormal   Collection Time: 07/15/21  7:18 PM   Specimen: Nasopharyngeal Swab; Respiratory  Result Value Ref Range Status   Adenovirus NOT DETECTED NOT DETECTED Final   Coronavirus 229E NOT DETECTED NOT DETECTED Final    Comment: (NOTE) The Coronavirus on the Respiratory Panel, DOES NOT test for the novel  Coronavirus  (2019 nCoV)    Coronavirus HKU1 NOT DETECTED NOT DETECTED Final   Coronavirus NL63 NOT DETECTED NOT DETECTED Final   Coronavirus OC43 NOT DETECTED NOT DETECTED Final   Metapneumovirus NOT DETECTED NOT DETECTED Final   Rhinovirus / Enterovirus DETECTED (A) NOT DETECTED Final   Influenza A NOT DETECTED NOT DETECTED Final   Influenza B NOT DETECTED NOT DETECTED Final   Parainfluenza Virus 1 NOT DETECTED NOT DETECTED Final   Parainfluenza Virus 2 NOT DETECTED NOT DETECTED Final   Parainfluenza Virus 3 NOT DETECTED NOT DETECTED Final   Parainfluenza Virus 4 NOT DETECTED NOT DETECTED Final   Respiratory Syncytial Virus NOT DETECTED NOT DETECTED Final   Bordetella pertussis NOT DETECTED NOT DETECTED Final   Bordetella Parapertussis NOT DETECTED NOT DETECTED Final   Chlamydophila pneumoniae NOT DETECTED NOT DETECTED Final   Mycoplasma pneumoniae NOT DETECTED NOT DETECTED Final    Comment: Performed at [facility], [address], [place], [place] [address]  SARS Coronavirus 2 by RT PCR (hospital order, performed in [facility] hospital lab) *cepheid single result test* Nasopharyngeal Swab     Status: None   Collection Time: 07/15/21  7:18 PM   Specimen: Nasopharyngeal Swab; Nasal Swab  Result Value Ref Range Status   SARS Coronavirus 2 by RT PCR NEGATIVE NEGATIVE Final    Comment: (NOTE) SARS-CoV-2 target nucleic acids are NOT DETECTED.  The SARS-CoV-2 RNA is generally detectable in upper and lower respiratory specimens during the acute phase of infection. The lowest concentration of SARS-CoV-2 viral copies this assay can detect is 250 copies / mL. A negative result does not preclude SARS-CoV-2 infection and should not be used as the sole basis for treatment or other patient management decisions.  A negative result may occur with improper specimen collection / handling, submission of specimen other than nasopharyngeal swab, presence of viral mutation(s) within the areas targeted by  this assay, and inadequate number of viral copies (<250 copies / mL). A negative result must be combined with clinical observations, patient history, and epidemiological information.  Fact Sheet for Patients:   RoadLapTop.co.za  Fact Sheet for Healthcare Providers: http://kim-miller.com/  This test is not yet approved or  cleared by the [place]  FDA and has been authorized for detection and/or diagnosis of SARS-CoV-2 by FDA under an Emergency Use Authorization (EUA).  This EUA will remain in effect (meaning this test can be used) for the duration of the COVID-19 declaration under Section 564(b)(1) of the Act, 21 U.S.C. section 360bbb-3(b)(1), unless the authorization is terminated or revoked sooner.  Performed at [facility], [address] [place], [place] [address]   Culture, Respiratory w Gram Stain     Status: None (Preliminary result)   Collection Time: 2021-08-03 10:57 PM   Specimen: Tracheal Aspirate  Result Value Ref Range Status   Specimen Description   Final    EXPECTORATED SPUTUM Performed at [facility], [address], [place], [place] [address]    Special Requests   Final    NONE Performed at [facility], [address] [place], [place] [address]    Gram Stain   Final    NO SQUAMOUS EPITHELIAL CELLS SEEN FEW WBC SEEN NO ORGANISMS SEEN Performed at [facility], [address], [place], [place] [address]    Culture PENDING  Incomplete   Report Status PENDING  Incomplete  MRSA Next Gen by PCR, Nasal     Status: None   Collection Time: 07/20/2021  3:17 AM   Specimen: Nasal Mucosa; Nasal Swab  Result Value Ref Range Status   MRSA by PCR Next Gen NOT DETECTED NOT DETECTED Final    Comment: (NOTE) The GeneXpert MRSA Assay (FDA approved for NASAL specimens only), is one component of a comprehensive MRSA colonization surveillance program. It is not intended to diagnose MRSA  infection nor to guide or monitor treatment for MRSA infections. Test performance is not FDA approved in patients less than 32 years old. Performed at [facility], [address] [place], [place] [address]     Lab Basic Metabolic Panel: Recent Labs  Lab 2021-08-03 1810 08/03/21 2257 07/14/2021 0527 07/15/2021 1210  NA 139  --  138 138  K 7.2* 6.0* 5.7* 6.1*  CL 120*  --  116* 114*  CO2 15*  --  17* 18*  GLUCOSE 155*  --  143* 232*  BUN 41*  --  45* 44*  CREATININE 1.88*  --  1.95* 2.03*  CALCIUM 10.6*  --  8.3* 8.4*  MG 2.7*  --  2.2  --   PHOS 6.6*  --  4.4  --    Liver Function Tests: Recent Labs  Lab August 03, 2021 1810 07/22/2021 0527  AST 60* 59*  ALT 28 27  ALKPHOS 162* 125  BILITOT 1.0 1.5*  PROT 7.1 6.0*  ALBUMIN 2.8* 2.7*   No results for input(s): LIPASE, AMYLASE in the last 168 hours. No results for input(s): AMMONIA in the last 168 hours. CBC: Recent Labs  Lab 08-03-21 1654 08/03/2021 2257 08/01/2021 0527  WBC 22.1*  --  17.4*  HGB 9.3* 8.1* 8.3*  HCT 34.0* 28.2* 28.5*  MCV 116.8*  --  109.2*  PLT 229  --  202   Cardiac Enzymes: No results for input(s): CKTOTAL, CKMB, CKMBINDEX, TROPONINI in the last 168 hours. Sepsis Labs: Recent Labs  Lab 08/03/2021 1654 08-03-21 1917 03-Aug-2021 1917 August 03, 2021 2257 07/25/2021 0527 08/09/2021 0758 08/03/2021 1210  PROCALCITON  --  0.25  --   --  10.27  --   --   WBC 22.1*  --   --   --  17.4*  --   --   LATICACIDVEN  --  3.8*   < >  3.8* 3.8* 3.5* 4.3*   < > = values in this interval not displayed.    Procedures/Operations  Mechanical Intubation  Left Internal Jugular Central Line Placement   Zada Girt, [place]  Pulmonary/Critical Care Pager [phone] (please enter 7 digits) PCCM Consult Pager [phone] (please enter 7 digits)

## 2021-08-12 NOTE — Progress Notes (Signed)
Eeg done 

## 2021-08-12 NOTE — Progress Notes (Signed)
Pt extubated to comfort care.  

## 2021-08-12 DEATH — deceased

## 2023-01-23 IMAGING — CT CT HEAD W/O CM
4 series · 17 of 47 positions shown, 19 images · non-contrast
Comparison: None Available.

CLINICAL DATA: Cardiac arrest.  Mental status change.



[Series 2: head wo · axial · 0.40mm/px · z∈[-182,-52]mm · 7 of 36 slices shown, 9 images]
[im 5/36  brain]
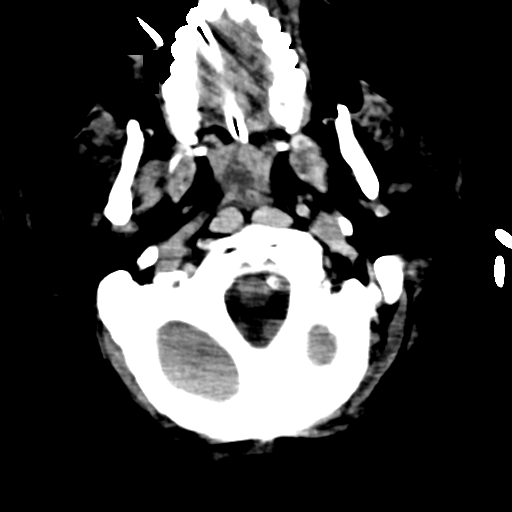
[im 5/36  bone]
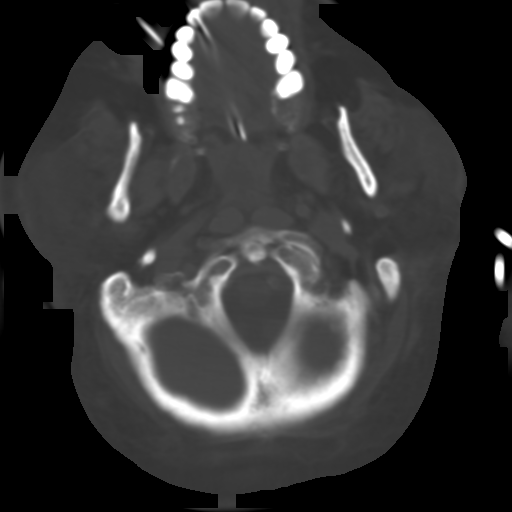
[im 9/36  brain]
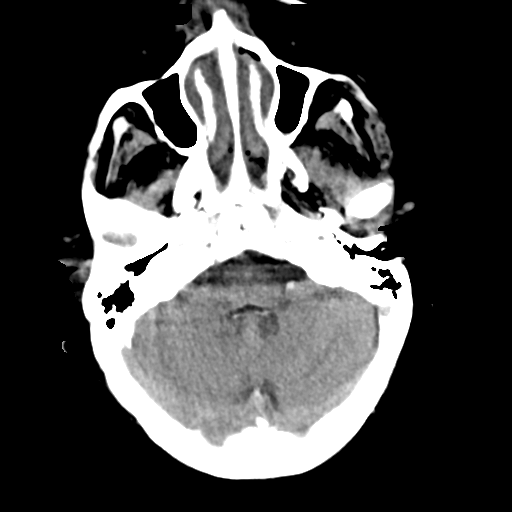
[im 14/36  brain]
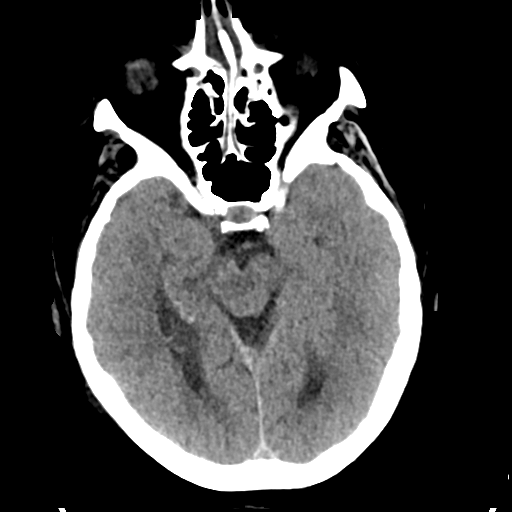
[im 18/36  brain]
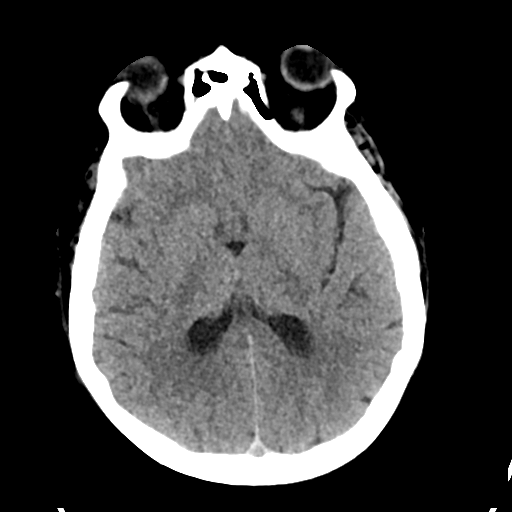
[im 22/36  brain]
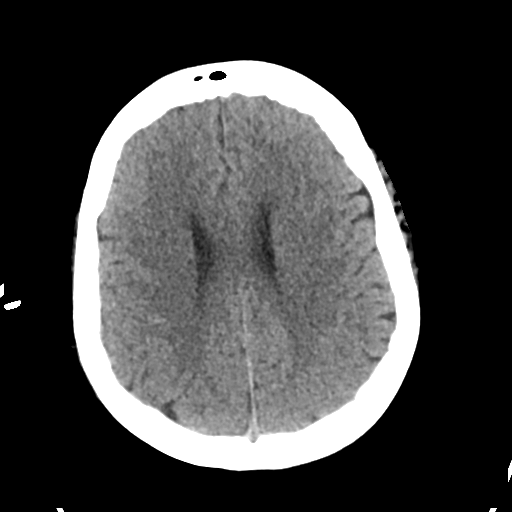
[im 22/36  bone]
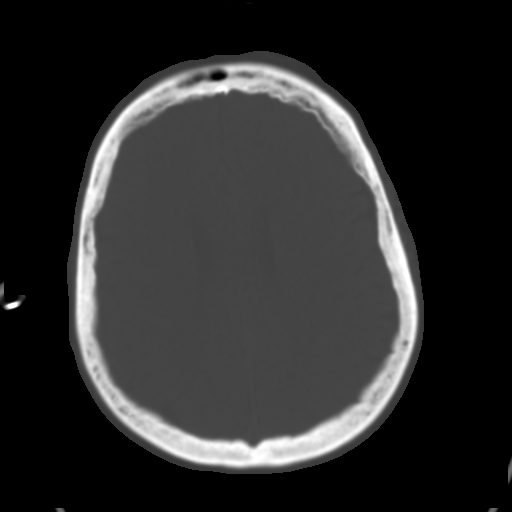
[im 27/36  brain]
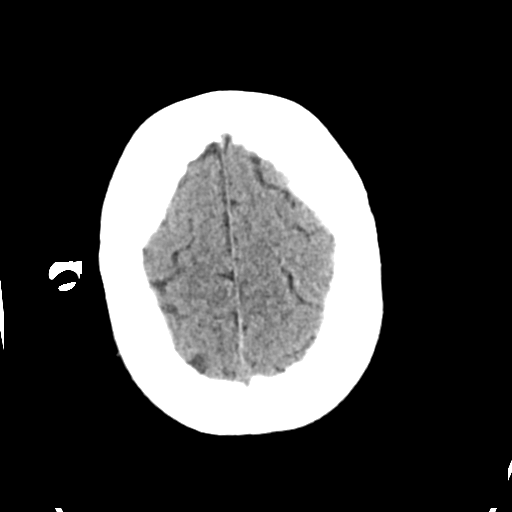
[im 31/36  brain]
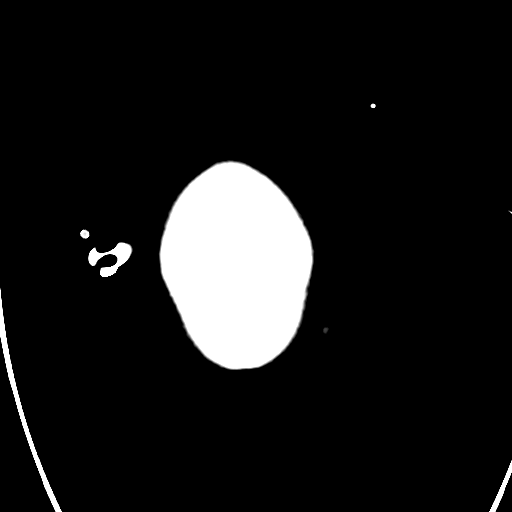

[Series 3: head bone · axial · 0.40mm/px · z∈[-186,-124]mm · 4 of 89 slices shown]
[im 9/89  bone]
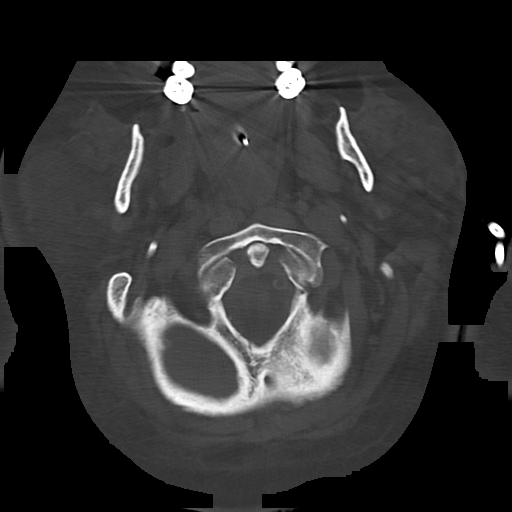
[im 18/89  bone]
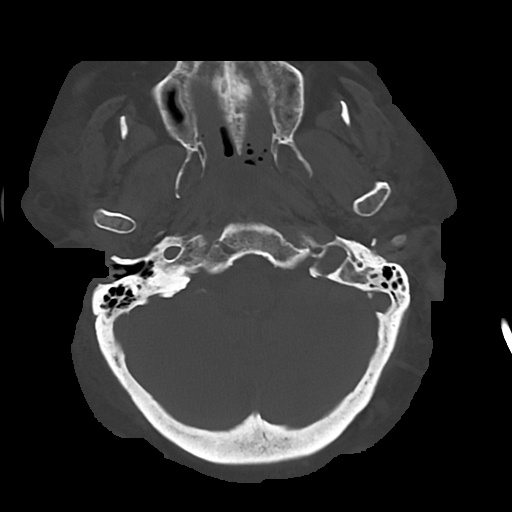
[im 27/89  bone]
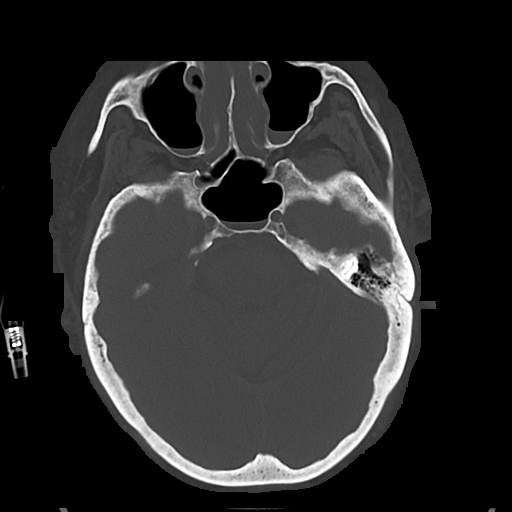
[im 40/89  bone]
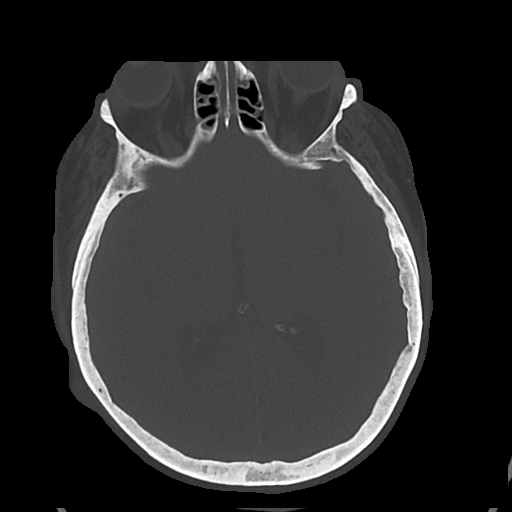

[Series 4: cor soft · coronal · 0.32mm/px · 3 of 71 slices shown]
[im 24/71  brain]
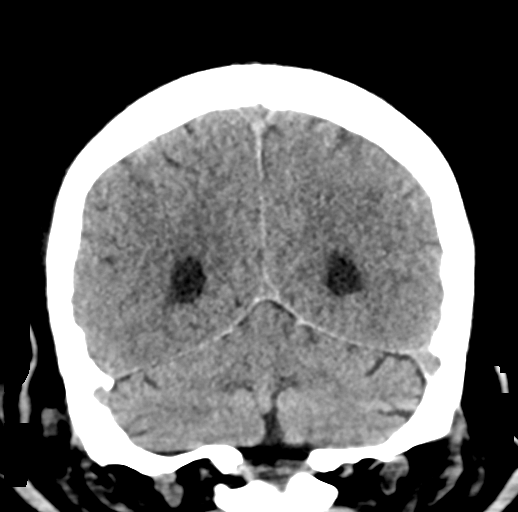
[im 32/71  brain]
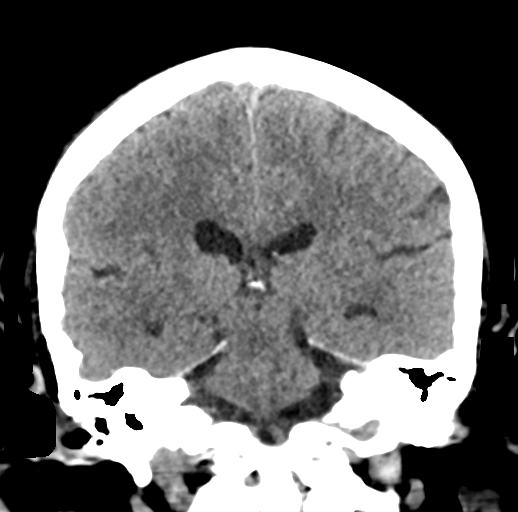
[im 39/71  brain]
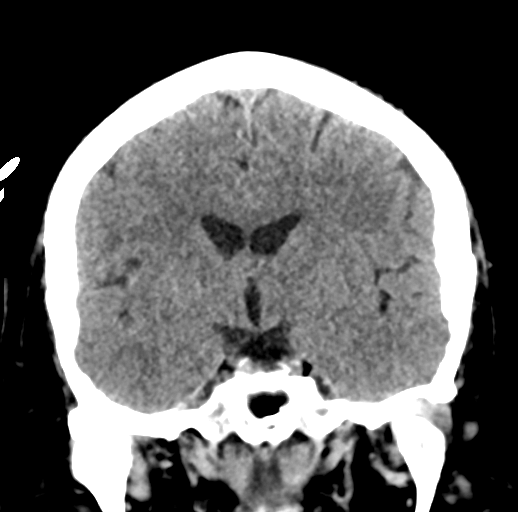

[Series 5: sag soft · sagittal · 0.36mm/px · 3 of 63 slices shown]
[im 21/63  brain]
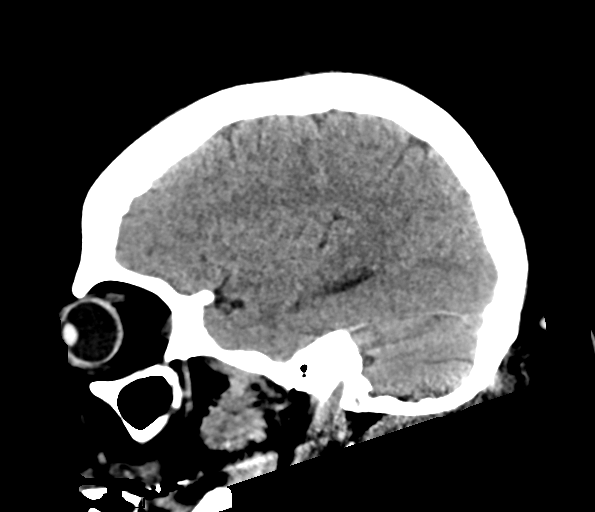
[im 32/63  brain]
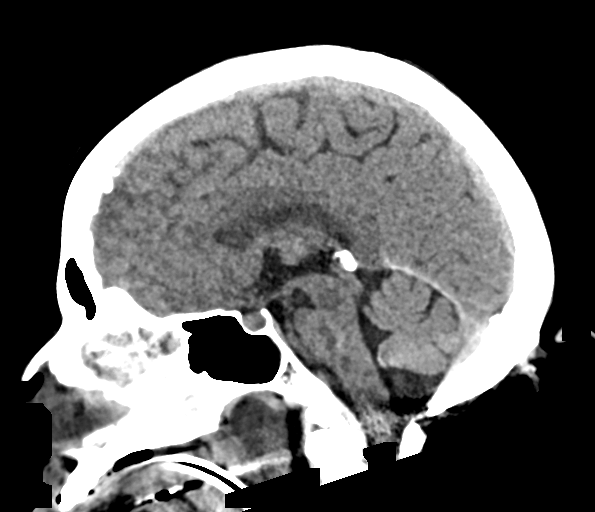
[im 42/63  brain]
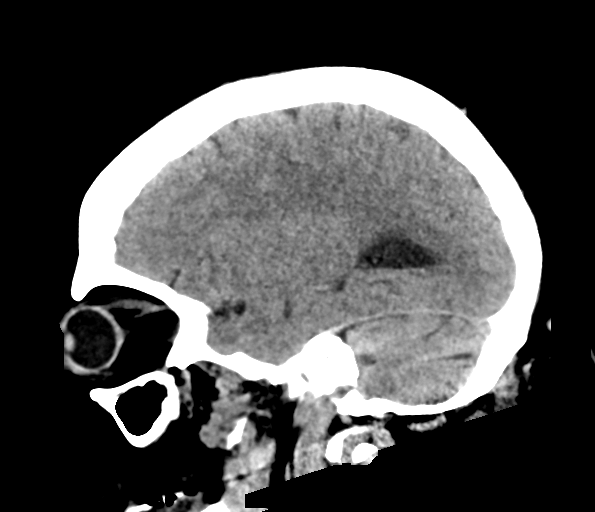

[17 of 47 positions shown; findings below may reference images not displayed]

FINDINGS: Brain: No intracranial hemorrhage, mass effect, or midline shift. No
hydrocephalus. The basilar cisterns are patent. No evidence of
territorial infarct or acute ischemia. Gray-white differentiation is
preserved. No extra-axial or intracranial fluid collection.

Vascular: Atherosclerosis of skullbase vasculature without
hyperdense vessel or abnormal calcification.

Skull: No fracture or focal lesion.

Sinuses/Orbits: Intubation. Mucosal thickening throughout the
paranasal sinuses and small fluid levels in the maxillary and
sphenoid sinus may be related to intubation. No mastoid effusion.

Other: None.
IMPRESSION: No acute intracranial abnormality.

## 2023-01-23 IMAGING — CT CT CHEST-ABD-PELV W/O CM
2 of 4 series · 12 of 36 positions shown, 14 images · non-contrast
Comparison: None Available.

CLINICAL DATA: Hemoptysis.  Cardiac arrest



[Series 2: cap without · axial · non-contrast · 0.80mm/px · z∈[-930,-310]mm · 9 of 150 slices shown, 11 images]
[im 13/150  mediastinal]
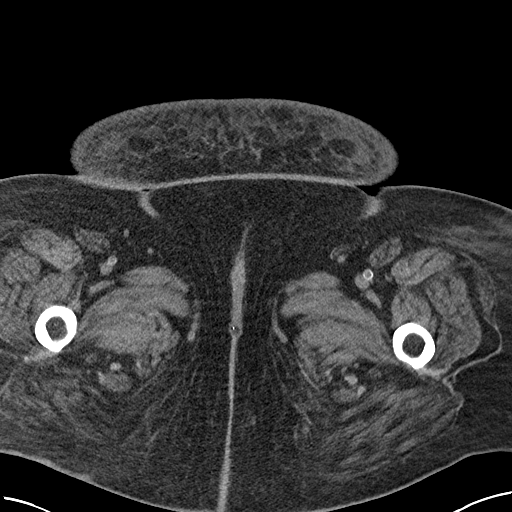
[im 13/150  bone]
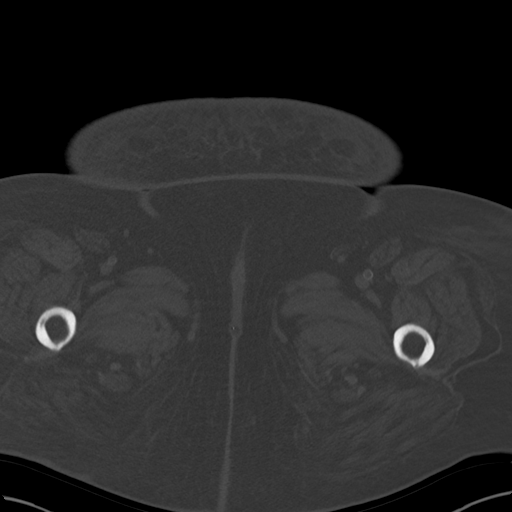
[im 25/150  mediastinal]
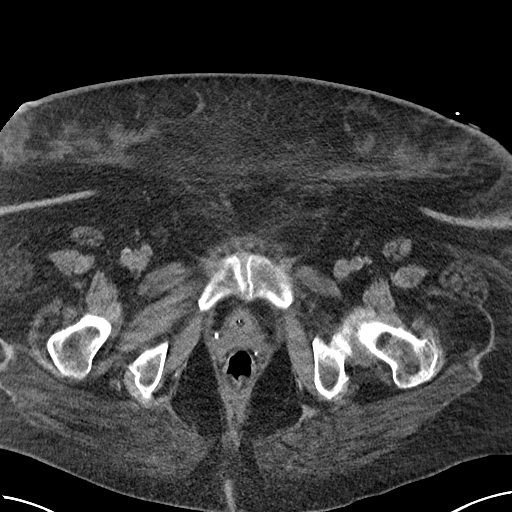
[im 50/150  mediastinal]
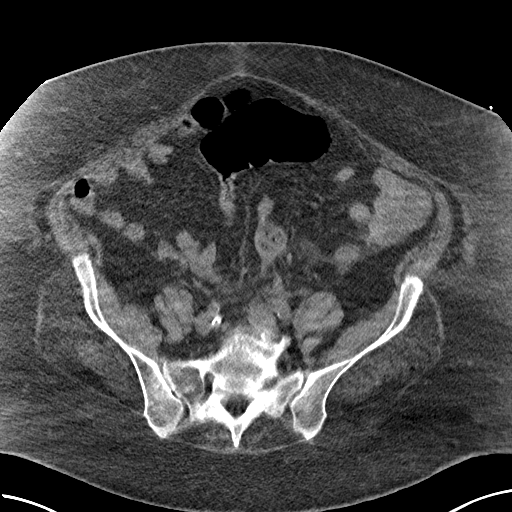
[im 63/150  mediastinal]
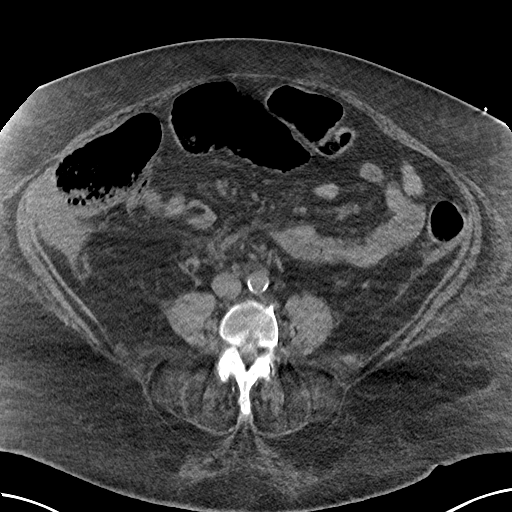
[im 75/150  mediastinal]
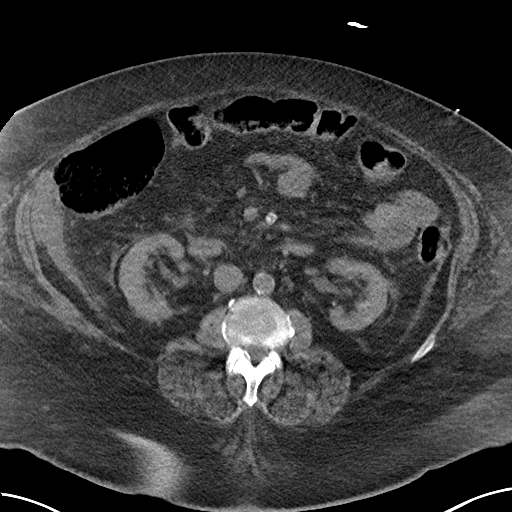
[im 87/150  mediastinal]
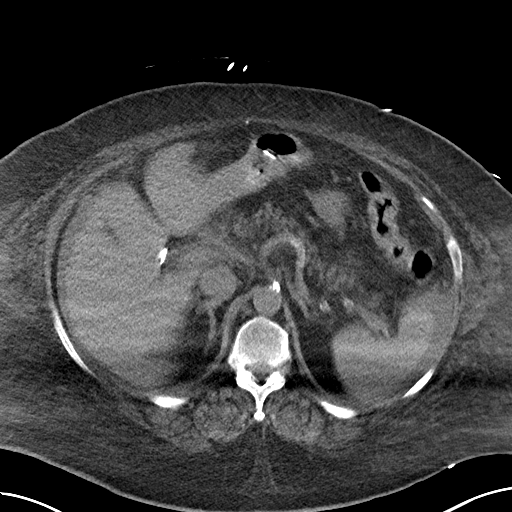
[im 100/150  mediastinal]
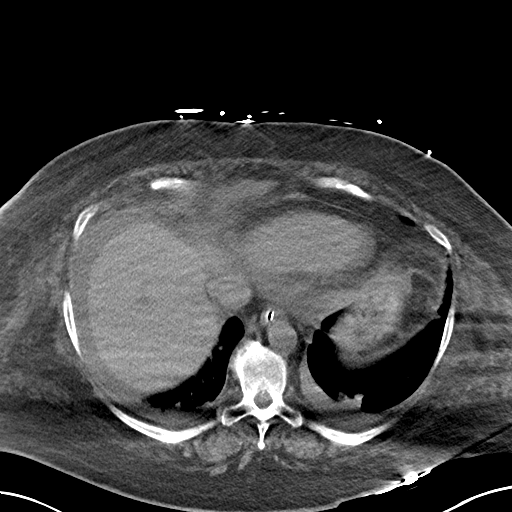
[im 125/150  mediastinal]
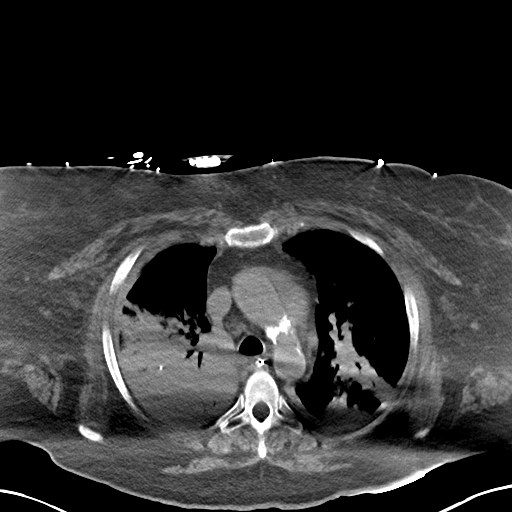
[im 137/150  mediastinal]
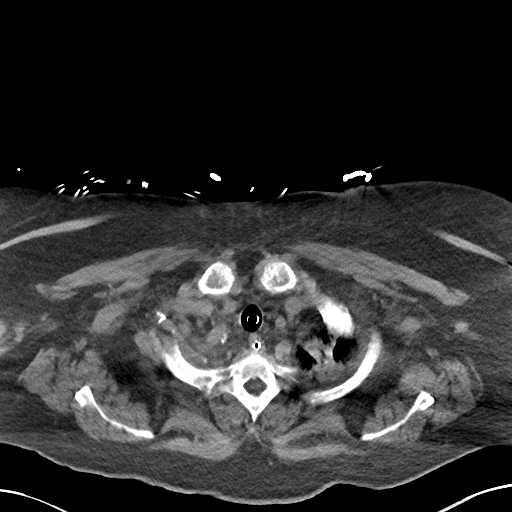
[im 137/150  bone]
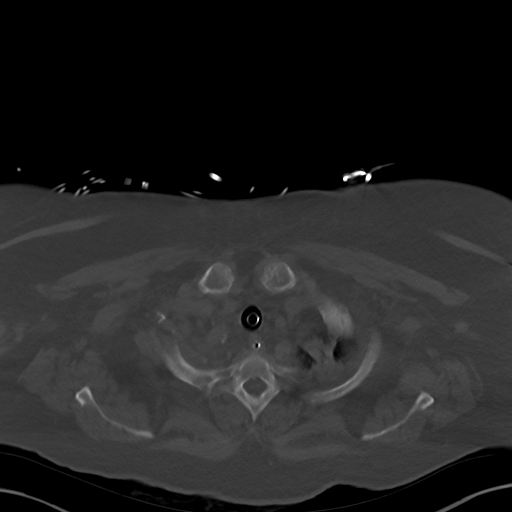

[Series 5: cor · coronal · 0.78mm/px · 3 of 125 slices shown]
[im 25/125  mediastinal]
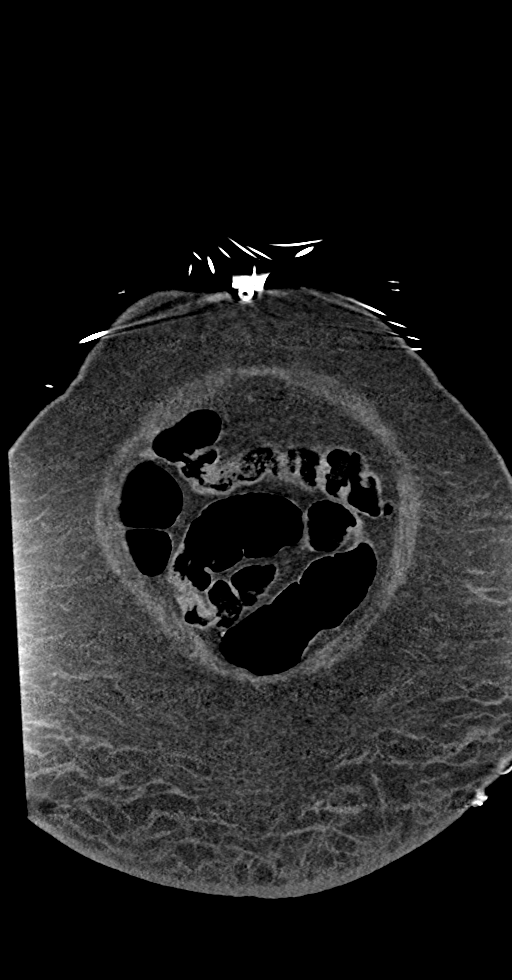
[im 50/125  mediastinal]
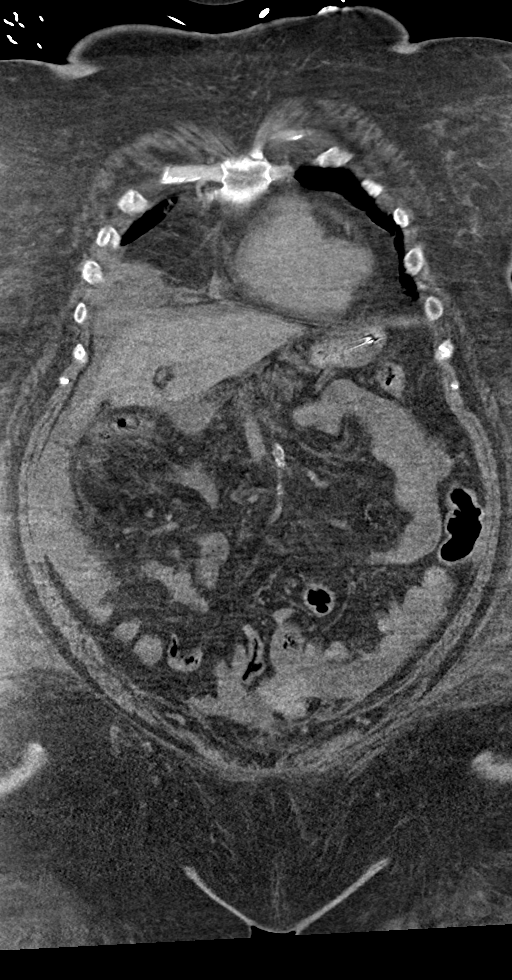
[im 75/125  mediastinal]
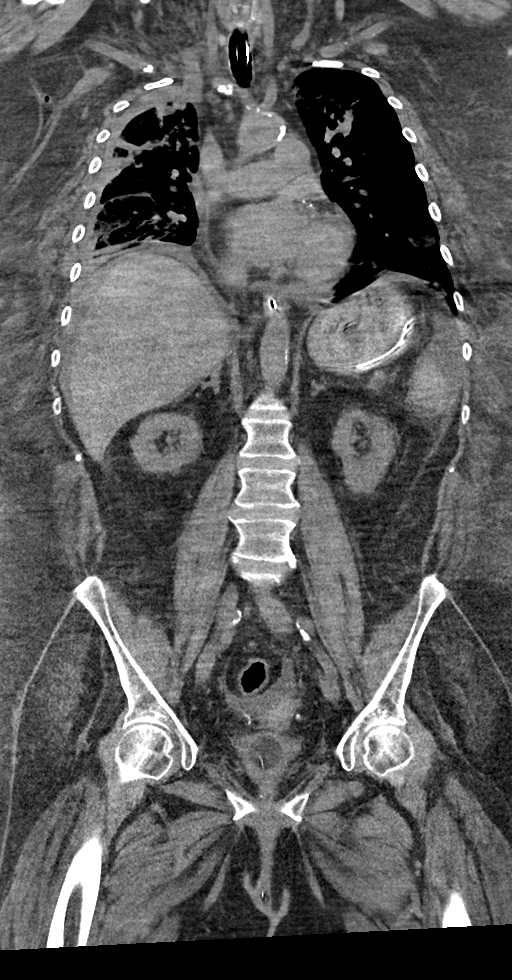

[12 of 36 positions shown; findings below may reference images not displayed]

FINDINGS: CT CHEST FINDINGS

Lines and tubes: Endotracheal tube terminates 3.5 cm above the
carina. Enteric tube with tip and side port within the gastric
lumen. The enteric tube is noted to loop once within the gastric
lumen.

Cardiovascular: Normal heart size. No significant pericardial
effusion. The thoracic aorta is normal in caliber. Mild-to-moderate
atherosclerotic plaque of the thoracic aorta. Four-vessel coronary
artery coronary artery calcifications.

Mediastinum/Nodes: No gross hilar adenopathy, noting limited
sensitivity for the detection of hilar adenopathy on this
noncontrast study. no enlarged mediastinal, hilar, or axillary lymph
nodes. Thyroid gland, trachea, and esophagus demonstrate no
significant findings.

Lungs/Pleura: Diffuse patchy airspace opacities with prominent
consolidations with air bronchograms within the peribronchovascular
regions and dependent portions of the lungs. Limited evaluation for
pulmonary mass. Likely trace bilateral pleural effusions. No
pneumothorax.

Musculoskeletal:

No chest wall abnormality.

No suspicious lytic or blastic osseous lesions. Acute nondisplaced
sternal fracture. Acute anterior nondisplaced left sixth, seventh,
eighth, ninth rib fractures. Acute anterior displaced left fifth rib
fracture. Acute right anterior nondisplaced third, sixth, seventh
rib fractures. Acute anterior displaced right fourth and fifth rib
fractures. Multilevel degenerative changes of the spine.

CT ABDOMEN PELVIS FINDINGS

Hepatobiliary: No focal liver abnormality. Status post
cholecystectomy. No biliary dilatation.

Pancreas: Diffusely atrophic. No focal lesion. Otherwise normal
pancreatic contour. No surrounding inflammatory changes. No main
pancreatic ductal dilatation.

Spleen: Normal in size without focal abnormality.

Adrenals/Urinary Tract:

No adrenal nodule bilaterally.

No nephrolithiasis and no hydronephrosis. No definite
contour-deforming renal mass.

No ureterolithiasis or hydroureter.

The urinary bladder is unremarkable.

Stomach/Bowel: Stomach is within normal limits. No evidence of bowel
wall thickening or dilatation. Appendix appears normal.

Vascular/Lymphatic: No abdominal aorta or iliac aneurysm. Severe
atherosclerotic plaque of the aorta and its branches. No abdominal,
pelvic, or inguinal lymphadenopathy.

Reproductive: Calcified lesion within the uterine wall likely
degenerative uterine fibroid. Otherwise uterus and bilateral adnexa
are unremarkable. Bilateral tubal ligation.

Other: At least small volume upper abdominal high density free fluid
extending into the pelvis. No intraperitoneal free gas. No organized
fluid collection.

Musculoskeletal:

No abdominal wall hernia or abnormality. Mild subcutaneus soft
tissue edema.

No suspicious lytic or blastic osseous lesions. No acute displaced
fracture. Multilevel degenerative changes of the spine.
IMPRESSION: 1. Diffuse patchy airspace opacities with prominent
consolidationswithin the peribronchovascular and dependent portions
of the lungs. Finding likely represent a combination of pulmonary
edema and infection/inflammation.
2. Trace bilateral pleural effusions.
3. At least small volume upper abdominal high density free fluid
extending into the pelvis. Finding could represent hemoperitoneum
versus ascites with limited evaluation on this noncontrast study.

4. Acute anterior left 5-9 and right 3-7 rib fractures.
5. Acute nondisplaced sternal fracture.
6. Aortic Atherosclerosis ([id number]) including four-vessel
coronary calcifications.
7. Endotracheal tube in good position.
8. Enteric tube in good position with loop within the gastric lumen.
Consider retracting by 6 cm.
9. Other imaging findings of potential clinical significance:
Uterine fibroids

## 2023-01-23 IMAGING — DX DG CHEST 1V PORT
1 series · 1 of 1 positions shown · non-contrast
Comparison: Radiograph and CT earlier today.

CLINICAL DATA: Central line placement.

EXAM:
PORTABLE CHEST 1 VIEW

[chest ap]
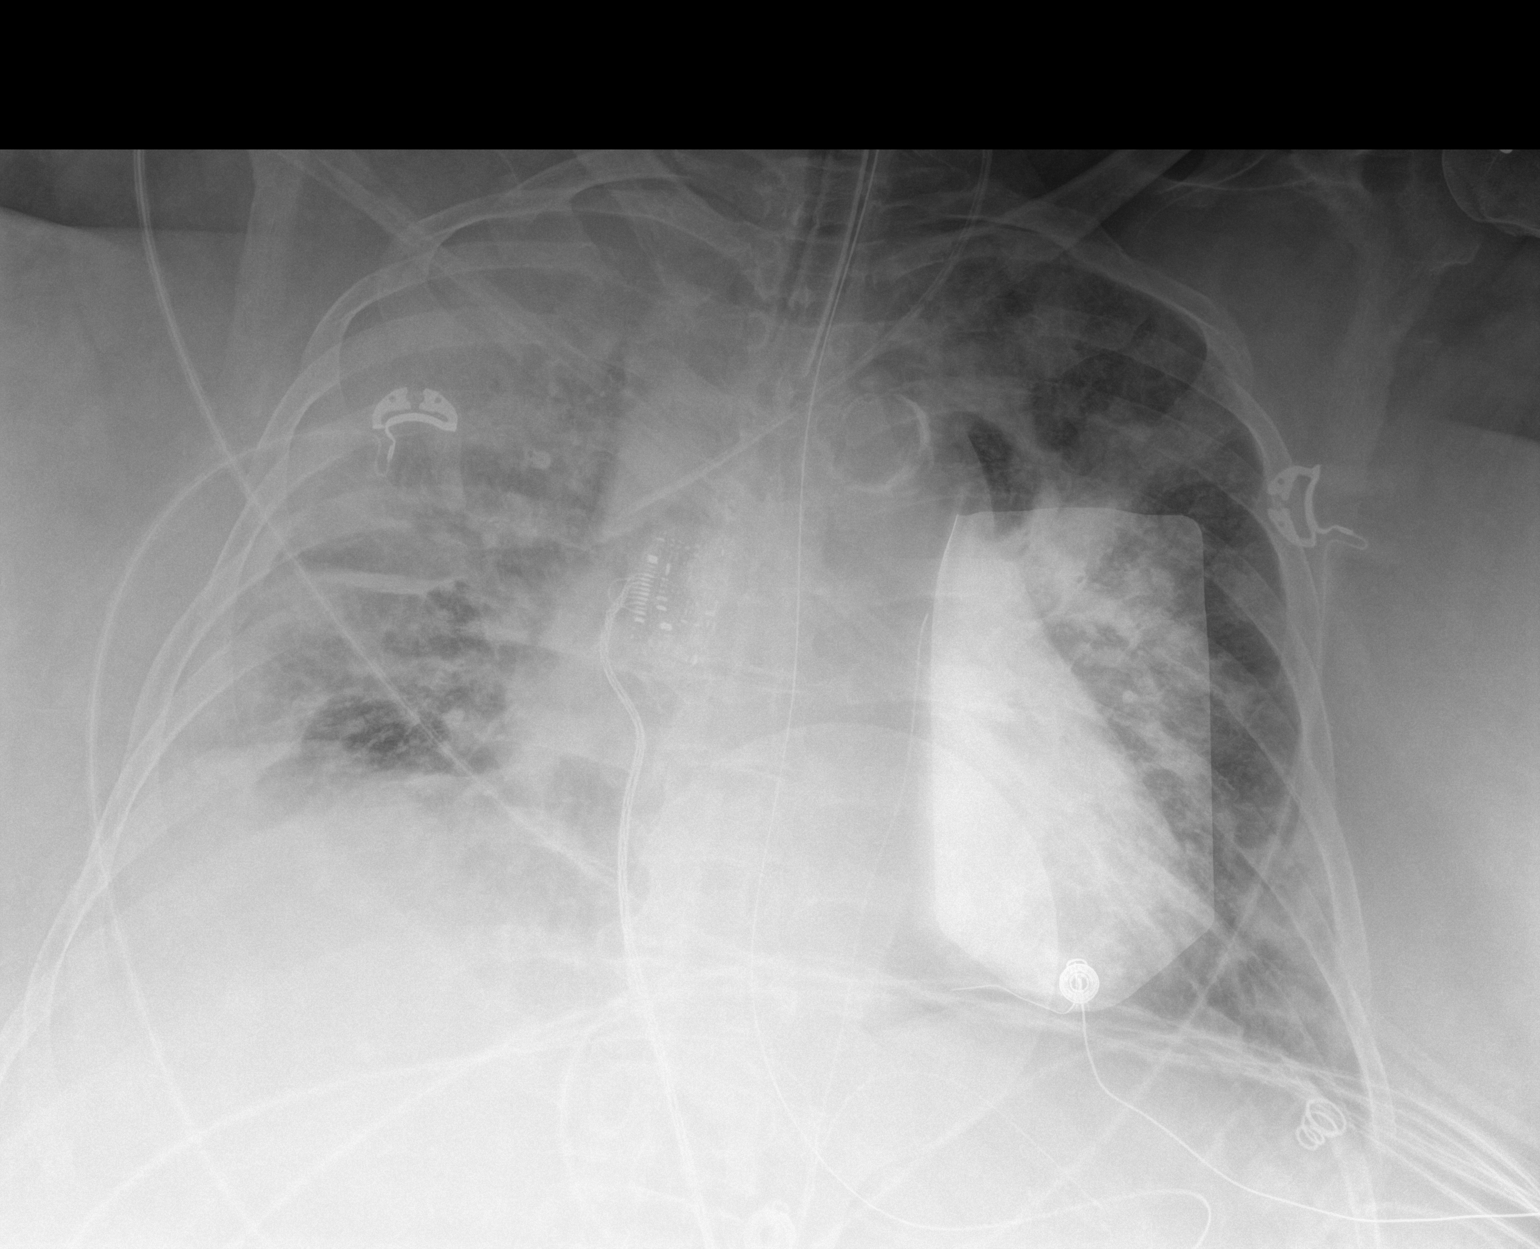

[1 of 1 positions shown; findings below may reference images not displayed]

FINDINGS: New left internal jugular central venous catheter tip overlies the
brachiocephalic confluence. Endotracheal tube tip approximately
cm from the carina. Enteric tube tip below the diaphragm not
included in the field of view. Dense consolidation in both lungs,
progressing in the right upper lung zone. No pneumothorax. Grossly
stable heart size.
IMPRESSION: 1. New left internal jugular central venous catheter with tip
overlying the brachiocephalic confluence. No pneumothorax.
2. Endotracheal tube tip approximately 3.2 cm from the carina.
Enteric tube tip below the diaphragm not included in the field of
view.
3. Dense bilateral airspace disease, with progression in the right
upper lung zone.

## 2023-01-24 IMAGING — DX DG CHEST 1V PORT
1 series · 1 of 1 positions shown · non-contrast
Comparison: Portable chest yesterday at [DATE] p.m., chest CT
yesterday at [DATE] p.m.

CLINICAL DATA: Ventilator dependent respiratory failure.

EXAM:
PORTABLE CHEST 1 VIEW

[chest ap]
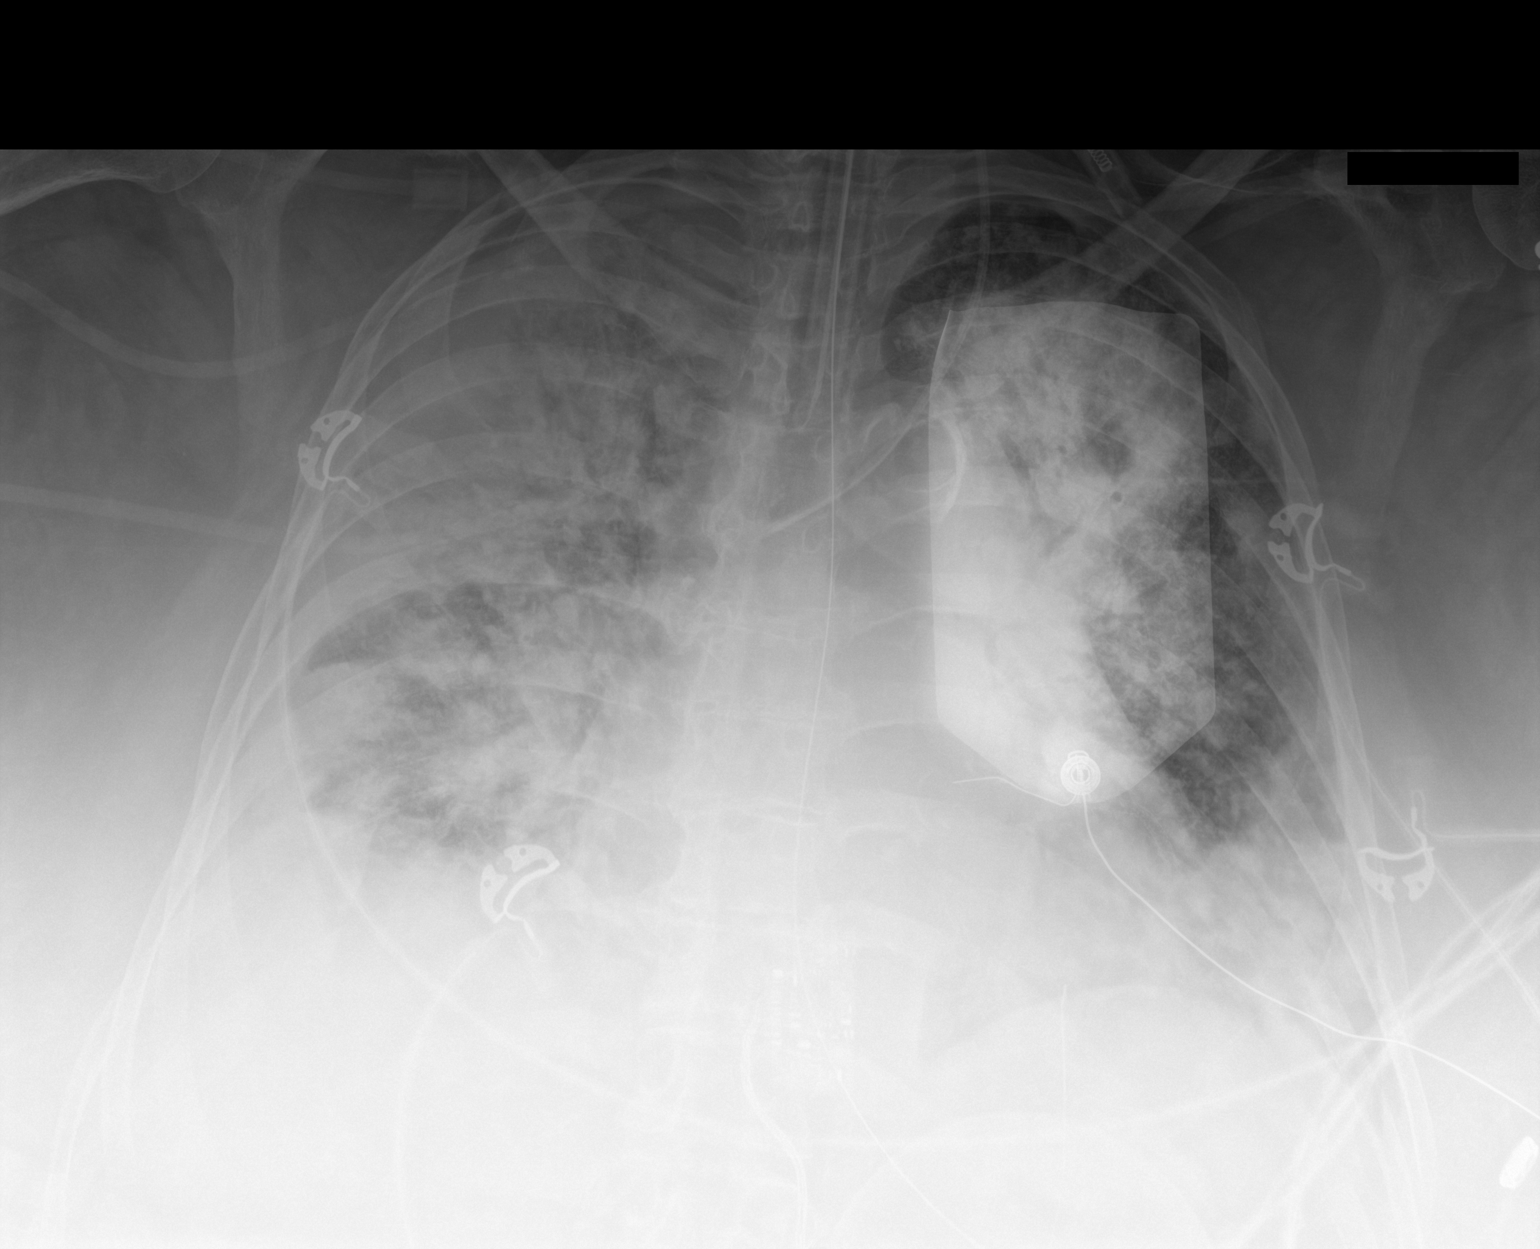

[1 of 1 positions shown; findings below may reference images not displayed]

FINDINGS: [DATE] a.m., 07/16/2021. A partially lucent electrical pad overlies
left chest. ETT tip 3.2 cm from the carina.

NGT is well inside the stomach, the distal end directed cephalad
just beneath the crest of the left hemidiaphragm. Left IJ central
line terminates in the upper SVC.

There is extensive diffuse patchy dense airspace consolidation
throughout the lungs, small pleural effusions.

Currently the opacities are more dense and confluent than previously
in the left upper lobe and lower lateral left lung, unchanged on the
right.

There is aortic atherosclerosis. The heart enlarged. Central
vascular prominence is similar.

Bilateral anterior ribcage fractures were better demonstrated on CT.
No pneumothorax is seen.
IMPRESSION: Worsening airspace disease in the left upper and lateral left lower
lung fields. No change in the right-sided airspace disease.

Persistent central vascular prominence. Small pleural effusions are
unchanged.

## 2023-01-24 IMAGING — US US EXTREM LOW VENOUS
1 series · 13 of 24 positions shown · non-contrast
Comparison: None Available.

CLINICAL DATA: Evaluate for DVT.



[Series 1: us venous img lower bilat (dvt) · portal-venous · 13 of 53 slices shown]
[im 1/53]
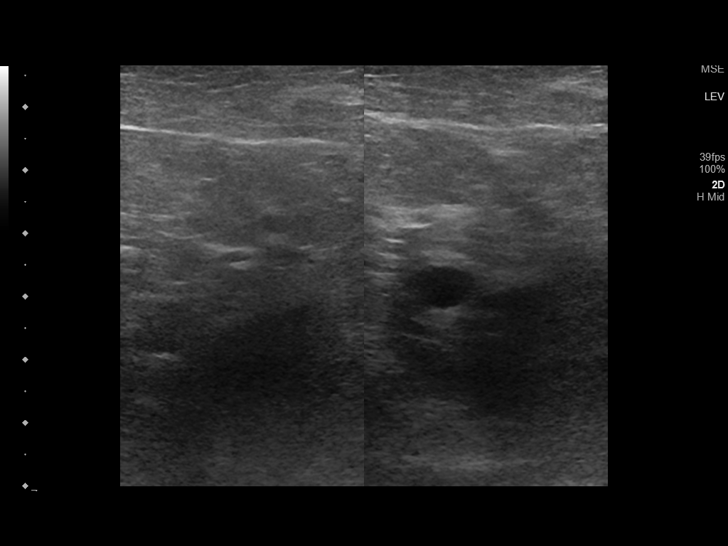
[im 5/53]
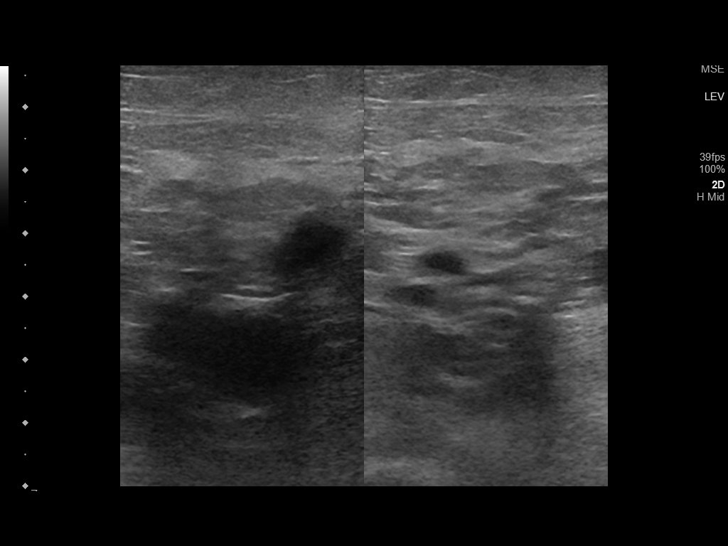
[im 10/53]
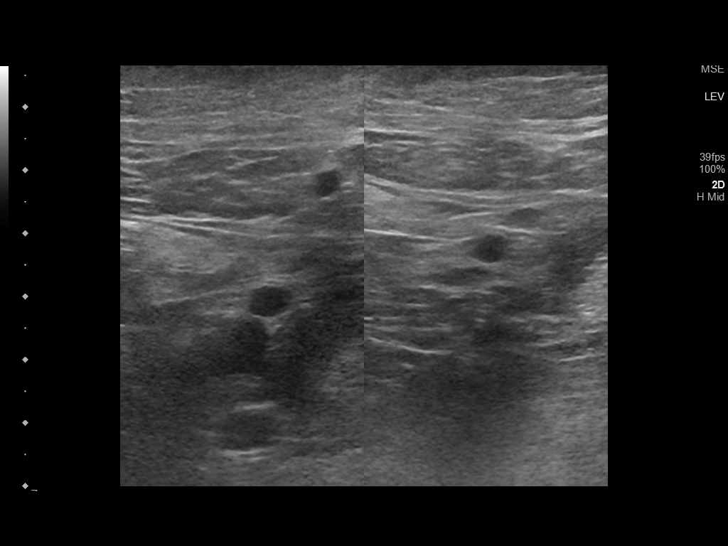
[im 14/53]
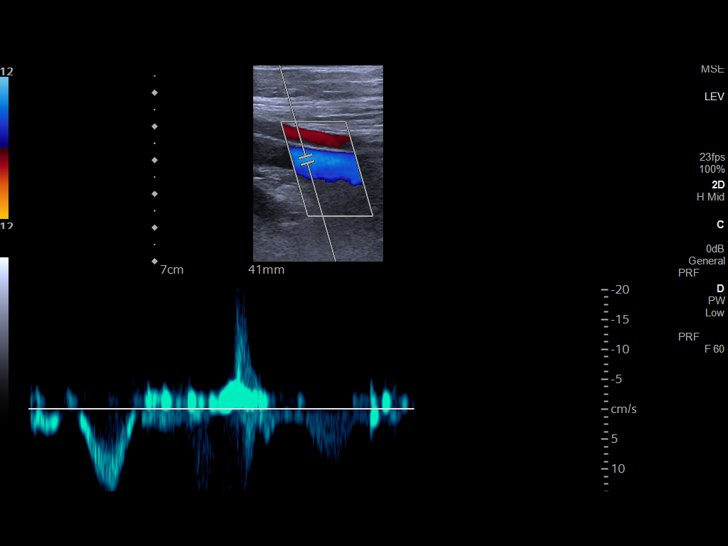
[im 19/53]
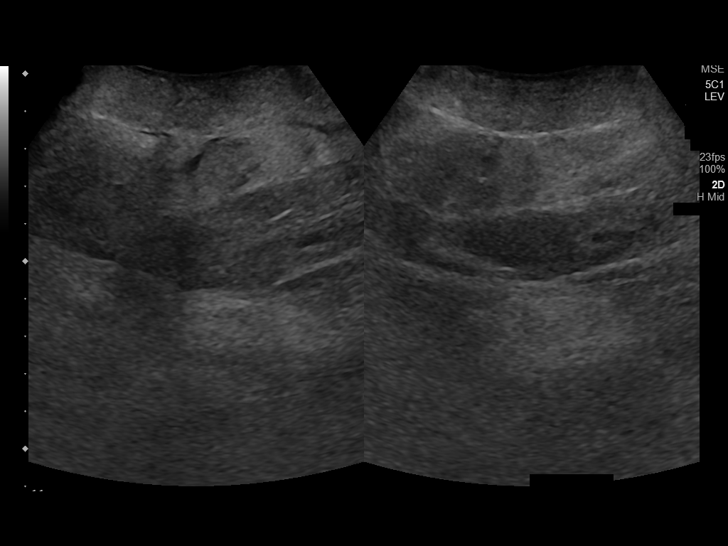
[im 23/53]
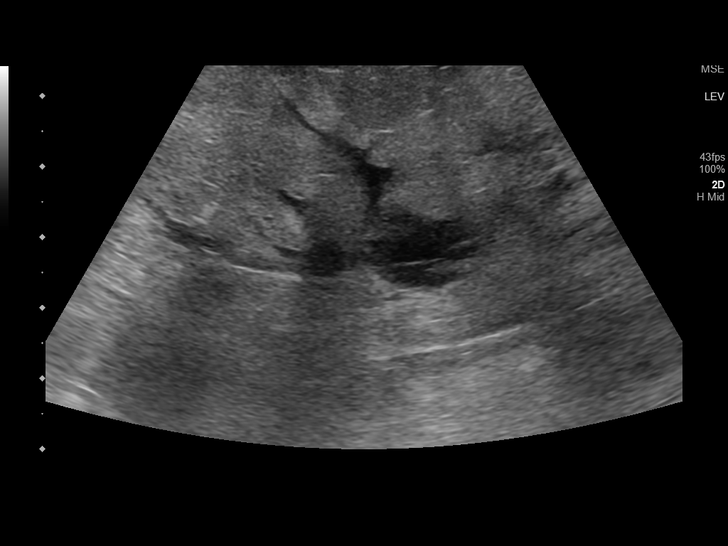
[im 28/53]
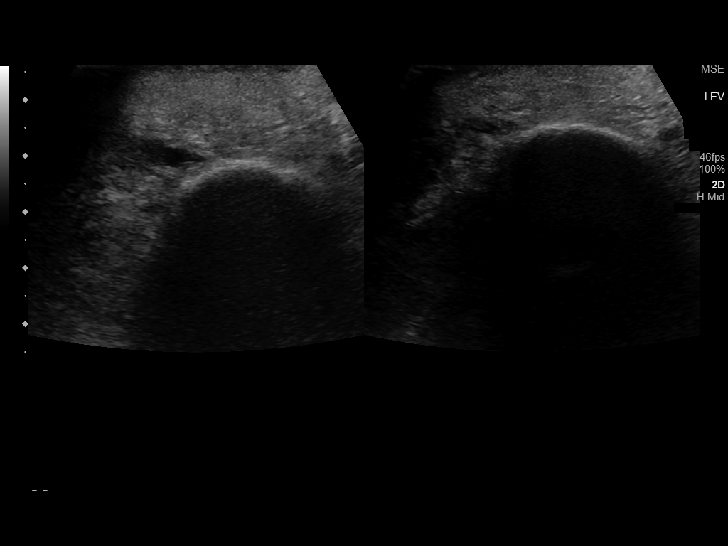
[im 30/53]
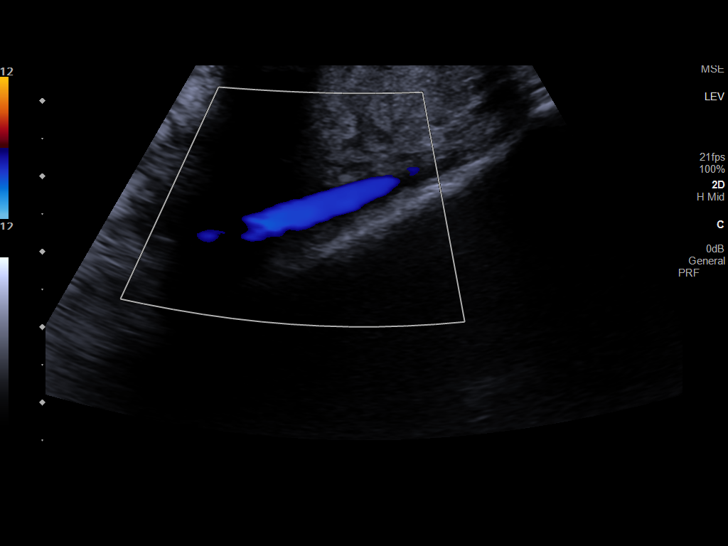
[im 34/53]
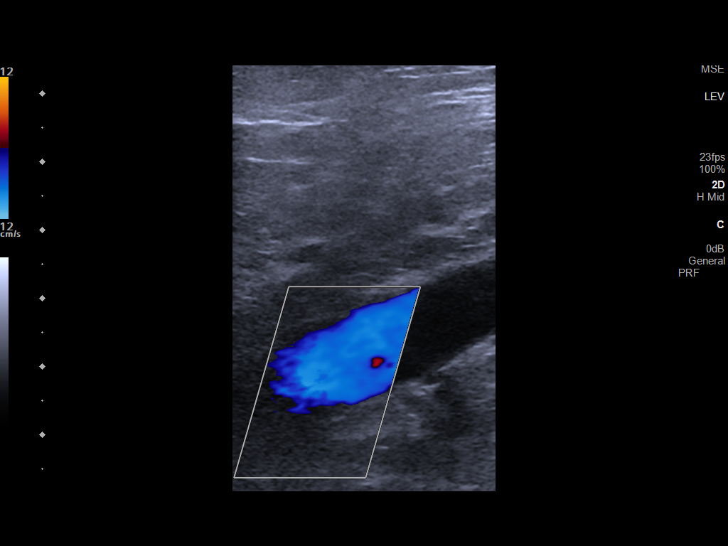
[im 39/53]
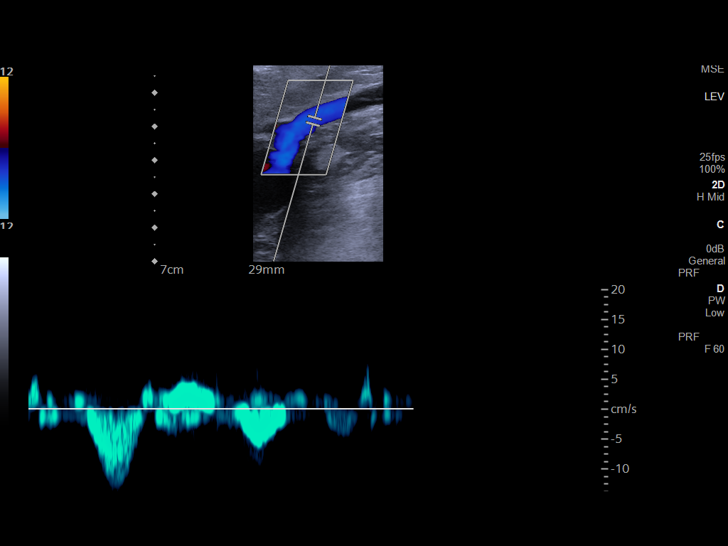
[im 43/53]
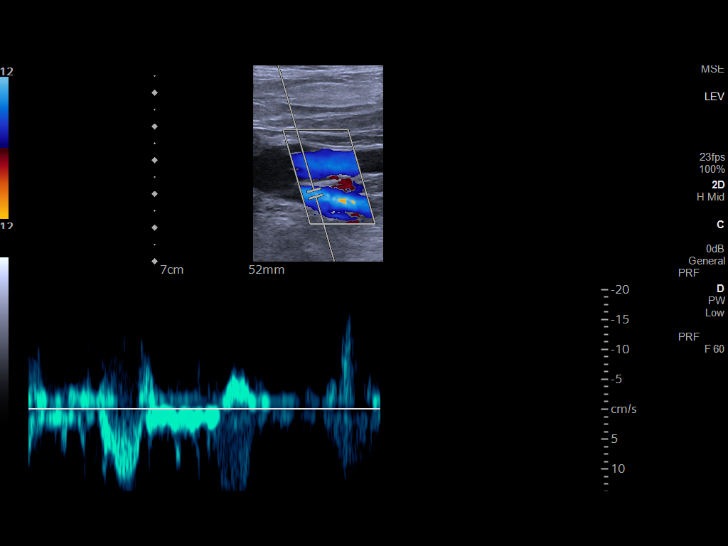
[im 48/53]
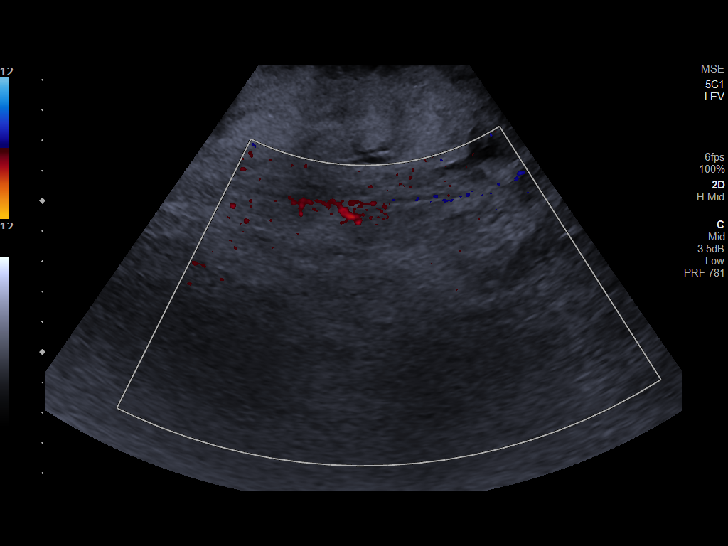
[im 53/53]
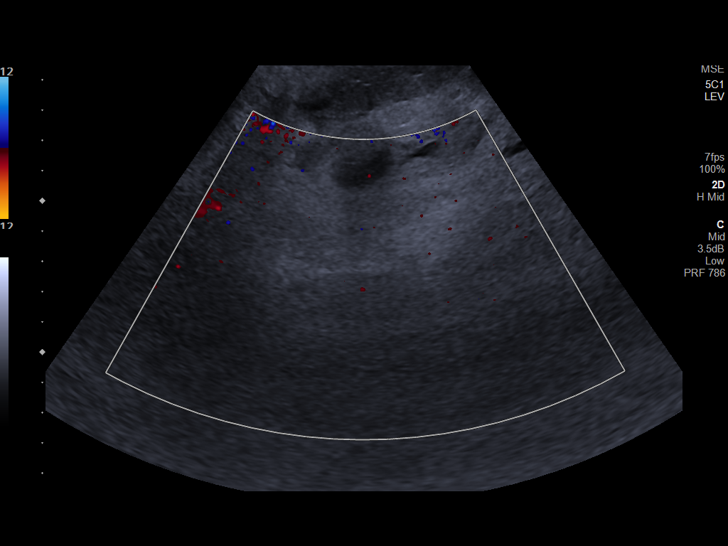

[13 of 24 positions shown; findings below may reference images not displayed]

FINDINGS: Examination is degraded due to patient body habitus and poor
sonographic window.

RIGHT LOWER EXTREMITY

Common Femoral Vein: No evidence of thrombus. Normal
compressibility, respiratory phasicity and response to augmentation.

Saphenofemoral Junction: No evidence of thrombus. Normal
compressibility and flow on color Doppler imaging.

Profunda Femoral Vein: No evidence of thrombus. Normal
compressibility and flow on color Doppler imaging.

Femoral Vein: No evidence of thrombus. Normal compressibility,
respiratory phasicity and response to augmentation.

Popliteal Vein: Not well visualized.

Calf Veins: Appear patent where visualized.

Superficial Great Saphenous Vein: No evidence of thrombus. Normal
compressibility.

Other Findings: There is a minimal amount of subcutaneous edema at
the level of the popliteal fossa without definitive Baker's cyst.

LEFT LOWER EXTREMITY

Common Femoral Vein: No evidence of thrombus. Normal
compressibility, respiratory phasicity and response to augmentation.

Saphenofemoral Junction: No evidence of thrombus. Normal
compressibility and flow on color Doppler imaging.

Profunda Femoral Vein: No evidence of thrombus. Normal
compressibility and flow on color Doppler imaging.

Femoral Vein: Appears patent where visualized.

Popliteal Vein: Not well visualized.

Calf Veins: Not well visualized.

Superficial Great Saphenous Vein: No evidence of thrombus. Normal
compressibility.

Other Findings:  None.
IMPRESSION: No definite evidence of DVT on this body habitus degraded
examination.
